# Patient Record
Sex: Female | Born: 1935 | Race: White | Hispanic: No | Marital: Married | Smoking: Never smoker
Health system: Southern US, Community
[De-identification: ages and names within clinical notes are randomized; demographics above are authoritative.]

## PROBLEM LIST (undated history)

## (undated) DIAGNOSIS — I10 Essential (primary) hypertension: Secondary | ICD-10-CM

## (undated) DIAGNOSIS — K219 Gastro-esophageal reflux disease without esophagitis: Secondary | ICD-10-CM

## (undated) DIAGNOSIS — Z8719 Personal history of other diseases of the digestive system: Secondary | ICD-10-CM

## (undated) DIAGNOSIS — Z9889 Other specified postprocedural states: Secondary | ICD-10-CM

## (undated) DIAGNOSIS — I4891 Unspecified atrial fibrillation: Secondary | ICD-10-CM

## (undated) DIAGNOSIS — I079 Rheumatic tricuspid valve disease, unspecified: Secondary | ICD-10-CM

## (undated) DIAGNOSIS — F419 Anxiety disorder, unspecified: Secondary | ICD-10-CM

## (undated) DIAGNOSIS — K2270 Barrett's esophagus without dysplasia: Secondary | ICD-10-CM

## (undated) DIAGNOSIS — C50919 Malignant neoplasm of unspecified site of unspecified female breast: Secondary | ICD-10-CM

## (undated) DIAGNOSIS — K5730 Diverticulosis of large intestine without perforation or abscess without bleeding: Secondary | ICD-10-CM

## (undated) DIAGNOSIS — E785 Hyperlipidemia, unspecified: Secondary | ICD-10-CM

## (undated) DIAGNOSIS — M1990 Unspecified osteoarthritis, unspecified site: Secondary | ICD-10-CM

## (undated) DIAGNOSIS — K635 Polyp of colon: Secondary | ICD-10-CM

## (undated) HISTORY — DX: Unspecified osteoarthritis, unspecified site: M19.90

## (undated) HISTORY — PX: MASTECTOMY: SHX3

## (undated) HISTORY — DX: Personal history of other diseases of the digestive system: Z87.19

## (undated) HISTORY — DX: Essential (primary) hypertension: I10

## (undated) HISTORY — DX: Malignant neoplasm of unspecified site of unspecified female breast: C50.919

## (undated) HISTORY — PX: CATARACT EXTRACTION: SUR2

## (undated) HISTORY — DX: Unspecified atrial fibrillation: I48.91

## (undated) HISTORY — DX: Diverticulosis of large intestine without perforation or abscess without bleeding: K57.30

## (undated) HISTORY — DX: Rheumatic tricuspid valve disease, unspecified: I07.9

## (undated) HISTORY — DX: Gastro-esophageal reflux disease without esophagitis: K21.9

## (undated) HISTORY — PX: OTHER SURGICAL HISTORY: SHX169

## (undated) HISTORY — PX: ABDOMINAL HYSTERECTOMY: SHX81

## (undated) HISTORY — DX: Polyp of colon: K63.5

## (undated) HISTORY — DX: Personal history of other diseases of the digestive system: Z98.890

## (undated) HISTORY — DX: Hyperlipidemia, unspecified: E78.5

## (undated) HISTORY — DX: Barrett's esophagus without dysplasia: K22.70

## (undated) HISTORY — DX: Anxiety disorder, unspecified: F41.9

---

## 2002-03-09 ENCOUNTER — Ambulatory Visit (HOSPITAL_COMMUNITY): Admission: RE | Admit: 2002-03-09 | Discharge: 2002-03-09 | Payer: Self-pay | Admitting: Gastroenterology

## 2002-03-09 ENCOUNTER — Encounter: Payer: Self-pay | Admitting: Gastroenterology

## 2003-11-26 ENCOUNTER — Ambulatory Visit: Payer: Self-pay | Admitting: Internal Medicine

## 2004-05-05 ENCOUNTER — Ambulatory Visit: Payer: Self-pay | Admitting: Internal Medicine

## 2004-11-07 ENCOUNTER — Ambulatory Visit: Payer: Self-pay | Admitting: Gastroenterology

## 2004-11-07 ENCOUNTER — Ambulatory Visit: Payer: Self-pay | Admitting: Internal Medicine

## 2004-11-08 ENCOUNTER — Encounter (INDEPENDENT_AMBULATORY_CARE_PROVIDER_SITE_OTHER): Payer: Self-pay | Admitting: Specialist

## 2004-11-08 ENCOUNTER — Ambulatory Visit: Payer: Self-pay | Admitting: Gastroenterology

## 2004-11-20 ENCOUNTER — Ambulatory Visit: Payer: Self-pay | Admitting: Gastroenterology

## 2005-04-17 ENCOUNTER — Ambulatory Visit: Payer: Self-pay | Admitting: Internal Medicine

## 2005-09-12 ENCOUNTER — Ambulatory Visit: Payer: Self-pay | Admitting: Internal Medicine

## 2005-11-01 ENCOUNTER — Ambulatory Visit: Payer: Self-pay | Admitting: Internal Medicine

## 2005-11-01 LAB — CONVERTED CEMR LAB
ALT: 21 units/L (ref 0–40)
AST: 29 units/L (ref 0–37)
Albumin: 3.8 g/dL (ref 3.5–5.2)
Alkaline Phosphatase: 86 units/L (ref 39–117)
BUN: 15 mg/dL (ref 6–23)
Bilirubin, Direct: 0.1 mg/dL (ref 0.0–0.3)
CO2: 29 meq/L (ref 19–32)
Calcium: 9.5 mg/dL (ref 8.4–10.5)
Chloride: 102 meq/L (ref 96–112)
Chol/HDL Ratio, serum: 4.6
Cholesterol: 236 mg/dL (ref 0–200)
Creatinine, Ser: 1.1 mg/dL (ref 0.4–1.2)
GFR calc non Af Amer: 51 mL/min
Glomerular Filtration Rate, Af Am: 62 mL/min/{1.73_m2}
Glucose, Bld: 95 mg/dL (ref 70–99)
HDL: 50.9 mg/dL (ref >39.0)
LDL DIRECT: 135.4 mg/dL
Potassium: 4 meq/L (ref 3.5–5.1)
Sodium: 140 meq/L (ref 135–145)
TSH: 3.11 microintl units/mL (ref 0.35–5.50)
Total Bilirubin: 1.2 mg/dL (ref 0.3–1.2)
Total Protein: 7 g/dL (ref 6.0–8.3)
Triglyceride fasting, serum: 283 mg/dL (ref 0–149)
VLDL: 57 mg/dL — ABNORMAL HIGH (ref 0–40)

## 2006-04-09 ENCOUNTER — Ambulatory Visit: Payer: Self-pay | Admitting: Internal Medicine

## 2006-04-25 ENCOUNTER — Ambulatory Visit: Payer: Self-pay | Admitting: Internal Medicine

## 2006-04-25 LAB — CONVERTED CEMR LAB
ALT: 24 units/L (ref 0–40)
AST: 27 units/L (ref 0–37)
Albumin: 3.9 g/dL (ref 3.5–5.2)
Alkaline Phosphatase: 82 units/L (ref 39–117)
BUN: 26 mg/dL — ABNORMAL HIGH (ref 6–23)
Bilirubin, Direct: 0.2 mg/dL (ref 0.0–0.3)
CO2: 29 meq/L (ref 19–32)
Calcium: 9.3 mg/dL (ref 8.4–10.5)
Chloride: 98 meq/L (ref 96–112)
Cholesterol: 227 mg/dL (ref 0–200)
Creatinine, Ser: 1.2 mg/dL (ref 0.4–1.2)
Direct LDL: 139.4 mg/dL
GFR calc Af Amer: 56 mL/min
GFR calc non Af Amer: 46 mL/min
Glucose, Bld: 85 mg/dL (ref 70–99)
HDL: 53.6 mg/dL (ref >39.0)
Potassium: 3.8 meq/L (ref 3.5–5.1)
Sodium: 138 meq/L (ref 135–145)
Total Bilirubin: 1.2 mg/dL (ref 0.3–1.2)
Total CHOL/HDL Ratio: 4.2
Total Protein: 7 g/dL (ref 6.0–8.3)
Triglycerides: 245 mg/dL (ref 0–149)
VLDL: 49 mg/dL — ABNORMAL HIGH (ref 0–40)

## 2006-08-22 ENCOUNTER — Telehealth: Payer: Self-pay | Admitting: Internal Medicine

## 2006-09-20 ENCOUNTER — Telehealth: Payer: Self-pay | Admitting: Internal Medicine

## 2006-09-23 ENCOUNTER — Ambulatory Visit: Payer: Self-pay | Admitting: Internal Medicine

## 2006-09-23 DIAGNOSIS — K219 Gastro-esophageal reflux disease without esophagitis: Secondary | ICD-10-CM | POA: Insufficient documentation

## 2006-09-23 DIAGNOSIS — I10 Essential (primary) hypertension: Secondary | ICD-10-CM | POA: Insufficient documentation

## 2006-09-23 DIAGNOSIS — F411 Generalized anxiety disorder: Secondary | ICD-10-CM | POA: Insufficient documentation

## 2006-09-23 DIAGNOSIS — E785 Hyperlipidemia, unspecified: Secondary | ICD-10-CM | POA: Insufficient documentation

## 2006-10-15 ENCOUNTER — Ambulatory Visit: Payer: Self-pay | Admitting: Internal Medicine

## 2006-10-15 DIAGNOSIS — R7309 Other abnormal glucose: Secondary | ICD-10-CM | POA: Insufficient documentation

## 2006-10-15 DIAGNOSIS — I4891 Unspecified atrial fibrillation: Secondary | ICD-10-CM | POA: Insufficient documentation

## 2006-10-15 LAB — CONVERTED CEMR LAB
ALT: 24 units/L (ref 0–35)
AST: 29 units/L (ref 0–37)
Albumin: 3.9 g/dL (ref 3.5–5.2)
Alkaline Phosphatase: 83 units/L (ref 39–117)
BUN: 16 mg/dL (ref 6–23)
Basophils Absolute: 0 10*3/uL (ref 0.0–0.1)
Basophils Relative: 0.5 % (ref 0.0–1.0)
Bilirubin, Direct: 0.3 mg/dL (ref 0.0–0.3)
CO2: 29 meq/L (ref 19–32)
Calcium: 9.6 mg/dL (ref 8.4–10.5)
Chloride: 103 meq/L (ref 96–112)
Cholesterol, target level: 200 mg/dL
Cholesterol: 215 mg/dL (ref 0–200)
Creatinine, Ser: 1.1 mg/dL (ref 0.4–1.2)
Direct LDL: 124.5 mg/dL
Eosinophils Absolute: 0.1 10*3/uL (ref 0.0–0.6)
Eosinophils Relative: 2.1 % (ref 0.0–5.0)
GFR calc Af Amer: 62 mL/min
GFR calc non Af Amer: 51 mL/min
Glucose, Bld: 112 mg/dL — ABNORMAL HIGH (ref 70–99)
HCT: 41.3 % (ref 36.0–46.0)
HDL goal, serum: 40 mg/dL
HDL: 45.5 mg/dL (ref >39.0)
Hemoglobin: 14.1 g/dL (ref 12.0–15.0)
Hgb A1c MFr Bld: 6.1 % — ABNORMAL HIGH (ref 4.6–6.0)
LDL Goal: 130 mg/dL
Lymphocytes Relative: 25.5 % (ref 12.0–46.0)
MCHC: 34.1 g/dL (ref 30.0–36.0)
MCV: 89.7 fL (ref 78.0–100.0)
Monocytes Absolute: 0.9 10*3/uL — ABNORMAL HIGH (ref 0.2–0.7)
Monocytes Relative: 13.3 % — ABNORMAL HIGH (ref 3.0–11.0)
Neutro Abs: 4.3 10*3/uL (ref 1.4–7.7)
Neutrophils Relative %: 58.6 % (ref 43.0–77.0)
Platelets: 272 10*3/uL (ref 150–400)
Potassium: 4.4 meq/L (ref 3.5–5.1)
RBC: 4.61 M/uL (ref 3.87–5.11)
RDW: 13.8 % (ref 11.5–14.6)
Sodium: 140 meq/L (ref 135–145)
TSH: 3.21 microintl units/mL (ref 0.35–5.50)
Total Bilirubin: 1.3 mg/dL — ABNORMAL HIGH (ref 0.3–1.2)
Total CHOL/HDL Ratio: 4.7
Total Protein: 7 g/dL (ref 6.0–8.3)
Triglycerides: 343 mg/dL (ref 0–149)
VLDL: 69 mg/dL — ABNORMAL HIGH (ref 0–40)
WBC: 7.1 10*3/uL (ref 4.5–10.5)

## 2006-10-18 ENCOUNTER — Ambulatory Visit: Payer: Self-pay | Admitting: Internal Medicine

## 2006-10-18 LAB — CONVERTED CEMR LAB
INR: 2.1
Prothrombin Time: 17.8 s

## 2006-10-21 ENCOUNTER — Ambulatory Visit: Payer: Self-pay

## 2006-10-21 ENCOUNTER — Encounter: Payer: Self-pay | Admitting: Internal Medicine

## 2006-10-23 ENCOUNTER — Ambulatory Visit: Payer: Self-pay | Admitting: Internal Medicine

## 2006-10-23 DIAGNOSIS — I079 Rheumatic tricuspid valve disease, unspecified: Secondary | ICD-10-CM

## 2006-10-23 HISTORY — DX: Rheumatic tricuspid valve disease, unspecified: I07.9

## 2006-10-23 LAB — CONVERTED CEMR LAB
INR: 4.8
Prothrombin Time: 26.4 s

## 2006-10-30 ENCOUNTER — Ambulatory Visit: Payer: Self-pay | Admitting: Internal Medicine

## 2006-10-30 LAB — CONVERTED CEMR LAB
INR: 6.1
Prothrombin Time: 29.9 s

## 2006-10-31 ENCOUNTER — Encounter: Payer: Self-pay | Admitting: Internal Medicine

## 2006-11-06 ENCOUNTER — Ambulatory Visit: Payer: Self-pay | Admitting: Internal Medicine

## 2006-11-06 LAB — CONVERTED CEMR LAB
INR: 4.3
Prothrombin Time: 25 s

## 2006-11-13 ENCOUNTER — Ambulatory Visit: Payer: Self-pay | Admitting: Internal Medicine

## 2006-11-13 LAB — CONVERTED CEMR LAB
INR: 2.9
Prothrombin Time: 20.6 s

## 2006-11-19 ENCOUNTER — Telehealth: Payer: Self-pay | Admitting: Internal Medicine

## 2006-11-29 ENCOUNTER — Ambulatory Visit: Payer: Self-pay | Admitting: Internal Medicine

## 2006-11-29 LAB — CONVERTED CEMR LAB
INR: 1.9
Prothrombin Time: 17 s

## 2006-12-16 ENCOUNTER — Ambulatory Visit: Payer: Self-pay | Admitting: Cardiovascular Disease

## 2006-12-16 ENCOUNTER — Telehealth (INDEPENDENT_AMBULATORY_CARE_PROVIDER_SITE_OTHER): Payer: Self-pay | Admitting: *Deleted

## 2006-12-30 ENCOUNTER — Ambulatory Visit: Payer: Self-pay | Admitting: Internal Medicine

## 2006-12-30 LAB — CONVERTED CEMR LAB
INR: 4
Prothrombin Time: 24.4 s

## 2007-01-09 DIAGNOSIS — K5730 Diverticulosis of large intestine without perforation or abscess without bleeding: Secondary | ICD-10-CM

## 2007-01-09 DIAGNOSIS — K635 Polyp of colon: Secondary | ICD-10-CM

## 2007-01-09 HISTORY — DX: Polyp of colon: K63.5

## 2007-01-09 HISTORY — DX: Diverticulosis of large intestine without perforation or abscess without bleeding: K57.30

## 2007-01-30 ENCOUNTER — Ambulatory Visit: Payer: Self-pay | Admitting: Internal Medicine

## 2007-01-30 LAB — CONVERTED CEMR LAB
INR: 2.3
Prothrombin Time: 18.5 s

## 2007-02-14 ENCOUNTER — Encounter: Payer: Self-pay | Admitting: Internal Medicine

## 2007-02-18 ENCOUNTER — Ambulatory Visit: Payer: Self-pay | Admitting: Gastroenterology

## 2007-02-20 ENCOUNTER — Telehealth: Payer: Self-pay | Admitting: Internal Medicine

## 2007-03-03 ENCOUNTER — Ambulatory Visit: Payer: Self-pay | Admitting: Internal Medicine

## 2007-03-03 LAB — CONVERTED CEMR LAB: INR: 4

## 2007-03-05 LAB — CONVERTED CEMR LAB
ALT: 30 units/L (ref 0–35)
AST: 35 units/L (ref 0–37)
Albumin: 3.5 g/dL (ref 3.5–5.2)
Alkaline Phosphatase: 75 units/L (ref 39–117)
BUN: 15 mg/dL (ref 6–23)
Bilirubin, Direct: 0.3 mg/dL (ref 0.0–0.3)
CO2: 33 meq/L — ABNORMAL HIGH (ref 19–32)
Calcium: 9.3 mg/dL (ref 8.4–10.5)
Chloride: 95 meq/L — ABNORMAL LOW (ref 96–112)
Cholesterol: 179 mg/dL (ref 0–200)
Creatinine, Ser: 0.9 mg/dL (ref 0.4–1.2)
Direct LDL: 55.8 mg/dL
GFR calc Af Amer: 78 mL/min
GFR calc non Af Amer: 64 mL/min
Glucose, Bld: 104 mg/dL — ABNORMAL HIGH (ref 70–99)
HDL: 33 mg/dL — ABNORMAL LOW (ref >39.0)
Potassium: 4.1 meq/L (ref 3.5–5.1)
Sodium: 139 meq/L (ref 135–145)
Total Bilirubin: 1.3 mg/dL — ABNORMAL HIGH (ref 0.3–1.2)
Total CHOL/HDL Ratio: 5.4
Total Protein: 6.4 g/dL (ref 6.0–8.3)
Triglycerides: 685 mg/dL (ref 0–149)
VLDL: 137 mg/dL — ABNORMAL HIGH (ref 0–40)

## 2007-03-11 ENCOUNTER — Encounter: Payer: Self-pay | Admitting: Gastroenterology

## 2007-03-11 ENCOUNTER — Ambulatory Visit: Payer: Self-pay | Admitting: Gastroenterology

## 2007-03-11 ENCOUNTER — Encounter: Payer: Self-pay | Admitting: Internal Medicine

## 2007-04-01 ENCOUNTER — Ambulatory Visit: Payer: Self-pay | Admitting: Internal Medicine

## 2007-04-01 LAB — CONVERTED CEMR LAB
INR: 1.4
Prothrombin Time: 14.7 s

## 2007-04-07 LAB — CONVERTED CEMR LAB
ALT: 30 units/L (ref 0–35)
AST: 35 units/L (ref 0–37)
Albumin: 4.1 g/dL (ref 3.5–5.2)
Alkaline Phosphatase: 67 units/L (ref 39–117)
BUN: 15 mg/dL (ref 6–23)
Bilirubin, Direct: 0.4 mg/dL — ABNORMAL HIGH (ref 0.0–0.3)
CO2: 32 meq/L (ref 19–32)
Calcium: 9.9 mg/dL (ref 8.4–10.5)
Chloride: 95 meq/L — ABNORMAL LOW (ref 96–112)
Cholesterol: 193 mg/dL (ref 0–200)
Creatinine, Ser: 1.1 mg/dL (ref 0.4–1.2)
Direct LDL: 96 mg/dL
GFR calc Af Amer: 62 mL/min
GFR calc non Af Amer: 51 mL/min
Glucose, Bld: 123 mg/dL — ABNORMAL HIGH (ref 70–99)
HDL: 33.6 mg/dL — ABNORMAL LOW (ref >39.0)
Hemoglobin: 15.3 g/dL — ABNORMAL HIGH (ref 12.0–15.0)
Hgb A1c MFr Bld: 6.3 % — ABNORMAL HIGH (ref 4.6–6.0)
Potassium: 4.7 meq/L (ref 3.5–5.1)
Sodium: 136 meq/L (ref 135–145)
Total Bilirubin: 1.4 mg/dL — ABNORMAL HIGH (ref 0.3–1.2)
Total CHOL/HDL Ratio: 5.7
Total Protein: 6.9 g/dL (ref 6.0–8.3)
Triglycerides: 385 mg/dL (ref 0–149)
VLDL: 77 mg/dL — ABNORMAL HIGH (ref 0–40)

## 2007-04-12 ENCOUNTER — Encounter: Payer: Self-pay | Admitting: Internal Medicine

## 2007-04-15 ENCOUNTER — Ambulatory Visit: Payer: Self-pay | Admitting: Internal Medicine

## 2007-04-15 LAB — CONVERTED CEMR LAB
INR: 2.3
Prothrombin Time: 18.6 s

## 2007-05-15 ENCOUNTER — Ambulatory Visit: Payer: Self-pay | Admitting: Internal Medicine

## 2007-05-15 LAB — CONVERTED CEMR LAB
INR: 2.5
Prothrombin Time: 19.2 s

## 2007-06-18 ENCOUNTER — Ambulatory Visit: Payer: Self-pay | Admitting: Internal Medicine

## 2007-06-18 LAB — CONVERTED CEMR LAB
INR: 2.4
Prothrombin Time: 18.8 s

## 2007-07-23 ENCOUNTER — Ambulatory Visit: Payer: Self-pay | Admitting: Internal Medicine

## 2007-07-23 LAB — CONVERTED CEMR LAB
INR: 3.1
Prothrombin Time: 21.2 s

## 2007-08-19 ENCOUNTER — Ambulatory Visit: Payer: Self-pay | Admitting: Internal Medicine

## 2007-08-19 LAB — CONVERTED CEMR LAB
INR: 2.8
Prothrombin Time: 20.2 s

## 2007-09-22 ENCOUNTER — Ambulatory Visit: Payer: Self-pay | Admitting: Internal Medicine

## 2007-09-22 LAB — CONVERTED CEMR LAB
ALT: 35 units/L (ref 0–35)
AST: 35 units/L (ref 0–37)
Albumin: 3.9 g/dL (ref 3.5–5.2)
Alkaline Phosphatase: 72 units/L (ref 39–117)
BUN: 20 mg/dL (ref 6–23)
Bilirubin, Direct: 0.1 mg/dL (ref 0.0–0.3)
CO2: 31 meq/L (ref 19–32)
Calcium: 9.2 mg/dL (ref 8.4–10.5)
Chloride: 107 meq/L (ref 96–112)
Cholesterol: 169 mg/dL (ref 0–200)
Creatinine, Ser: 1 mg/dL (ref 0.4–1.2)
Direct LDL: 73.4 mg/dL
GFR calc Af Amer: 69 mL/min
GFR calc non Af Amer: 57 mL/min
Glucose, Bld: 113 mg/dL — ABNORMAL HIGH (ref 70–99)
HDL: 33.5 mg/dL — ABNORMAL LOW (ref >39.0)
Hgb A1c MFr Bld: 6.1 % — ABNORMAL HIGH (ref 4.6–6.0)
Potassium: 3.6 meq/L (ref 3.5–5.1)
Sodium: 143 meq/L (ref 135–145)
Total Bilirubin: 1.2 mg/dL (ref 0.3–1.2)
Total CHOL/HDL Ratio: 5
Total Protein: 7.2 g/dL (ref 6.0–8.3)
Triglycerides: 544 mg/dL (ref 0–149)
VLDL: 109 mg/dL — ABNORMAL HIGH (ref 0–40)

## 2007-10-01 ENCOUNTER — Ambulatory Visit: Payer: Self-pay | Admitting: Internal Medicine

## 2007-10-22 ENCOUNTER — Ambulatory Visit: Payer: Self-pay | Admitting: Internal Medicine

## 2007-10-22 LAB — CONVERTED CEMR LAB
INR: 1.7
Prothrombin Time: 15.9 s

## 2007-10-24 ENCOUNTER — Ambulatory Visit: Payer: Self-pay | Admitting: Internal Medicine

## 2007-11-11 ENCOUNTER — Ambulatory Visit: Payer: Self-pay | Admitting: Internal Medicine

## 2007-11-11 LAB — CONVERTED CEMR LAB
INR: 3.7
Prothrombin Time: 23.2 s

## 2007-11-25 ENCOUNTER — Ambulatory Visit: Payer: Self-pay | Admitting: Internal Medicine

## 2007-11-25 LAB — CONVERTED CEMR LAB
INR: 1.7
Prothrombin Time: 16.1 s

## 2007-12-24 ENCOUNTER — Ambulatory Visit: Payer: Self-pay | Admitting: Internal Medicine

## 2007-12-24 DIAGNOSIS — M109 Gout, unspecified: Secondary | ICD-10-CM | POA: Insufficient documentation

## 2007-12-24 LAB — CONVERTED CEMR LAB
INR: 1.9
Prothrombin Time: 16.9 s

## 2007-12-25 ENCOUNTER — Telehealth: Payer: Self-pay | Admitting: Internal Medicine

## 2008-01-08 ENCOUNTER — Telehealth: Payer: Self-pay | Admitting: Internal Medicine

## 2008-01-26 ENCOUNTER — Ambulatory Visit: Payer: Self-pay | Admitting: Internal Medicine

## 2008-01-26 LAB — CONVERTED CEMR LAB
INR: 1.4
Prothrombin Time: 14.7 s

## 2008-02-10 ENCOUNTER — Encounter: Payer: Self-pay | Admitting: Internal Medicine

## 2008-02-10 ENCOUNTER — Ambulatory Visit: Payer: Self-pay | Admitting: Internal Medicine

## 2008-02-10 LAB — CONVERTED CEMR LAB
INR: 4.6
Prothrombin Time: 26.1 s

## 2008-03-02 ENCOUNTER — Telehealth: Payer: Self-pay | Admitting: Family Medicine

## 2008-03-02 ENCOUNTER — Ambulatory Visit: Payer: Self-pay | Admitting: Family Medicine

## 2008-03-02 LAB — CONVERTED CEMR LAB
Bilirubin Urine: NEGATIVE
Blood in Urine, dipstick: NEGATIVE
Glucose, Urine, Semiquant: NEGATIVE
INR: 1.6
Ketones, urine, test strip: NEGATIVE
Nitrite: POSITIVE
Protein, U semiquant: NEGATIVE
Prothrombin Time: 15.6 s
Specific Gravity, Urine: <1.005
Urobilinogen, UA: 0.2
pH: 5.5

## 2008-03-03 LAB — CONVERTED CEMR LAB
Basophils Absolute: 0 10*3/uL (ref 0.0–0.1)
Basophils Relative: 0.2 % (ref 0.0–3.0)
Eosinophils Absolute: 0.1 10*3/uL (ref 0.0–0.7)
Eosinophils Relative: 2.1 % (ref 0.0–5.0)
HCT: 39.8 % (ref 36.0–46.0)
Hemoglobin: 14.2 g/dL (ref 12.0–15.0)
Lymphocytes Relative: 19.5 % (ref 12.0–46.0)
MCHC: 35.8 g/dL (ref 30.0–36.0)
MCV: 90.7 fL (ref 78.0–100.0)
Monocytes Absolute: 1 10*3/uL (ref 0.1–1.0)
Monocytes Relative: 14.3 % — ABNORMAL HIGH (ref 3.0–12.0)
Neutro Abs: 4.4 10*3/uL (ref 1.4–7.7)
Neutrophils Relative %: 63.9 % (ref 43.0–77.0)
Platelets: 185 10*3/uL (ref 150–400)
RBC: 4.39 M/uL (ref 3.87–5.11)
RDW: 13.6 % (ref 11.5–14.6)
WBC: 6.8 10*3/uL (ref 4.5–10.5)

## 2008-03-11 ENCOUNTER — Telehealth: Payer: Self-pay | Admitting: Internal Medicine

## 2008-03-15 ENCOUNTER — Telehealth: Payer: Self-pay | Admitting: Internal Medicine

## 2008-03-17 ENCOUNTER — Telehealth: Payer: Self-pay | Admitting: Internal Medicine

## 2008-03-24 ENCOUNTER — Ambulatory Visit: Payer: Self-pay | Admitting: Internal Medicine

## 2008-03-25 ENCOUNTER — Encounter: Payer: Self-pay | Admitting: Internal Medicine

## 2008-03-25 LAB — CONVERTED CEMR LAB
ALT: 32 units/L (ref 0–35)
AST: 30 units/L (ref 0–37)
Albumin: 4 g/dL (ref 3.5–5.2)
Alkaline Phosphatase: 71 units/L (ref 39–117)
BUN: 21 mg/dL (ref 6–23)
Bilirubin, Direct: 0.2 mg/dL (ref 0.0–0.3)
CO2: 31 meq/L (ref 19–32)
Calcium: 9.1 mg/dL (ref 8.4–10.5)
Chloride: 102 meq/L (ref 96–112)
Cholesterol: 223 mg/dL — ABNORMAL HIGH (ref 0–200)
Creatinine, Ser: 0.9 mg/dL (ref 0.4–1.2)
Direct LDL: 108.6 mg/dL
GFR calc non Af Amer: 64.08 mL/min (ref >60)
Glucose, Bld: 110 mg/dL — ABNORMAL HIGH (ref 70–99)
HDL: 43.1 mg/dL (ref >39.00)
Hgb A1c MFr Bld: 6.2 % (ref 4.6–6.5)
Potassium: 3.4 meq/L — ABNORMAL LOW (ref 3.5–5.1)
Sodium: 144 meq/L (ref 135–145)
TSH: 2.46 microintl units/mL (ref 0.35–5.50)
Total Bilirubin: 1.3 mg/dL — ABNORMAL HIGH (ref 0.3–1.2)
Total CHOL/HDL Ratio: 5
Total Protein: 7.4 g/dL (ref 6.0–8.3)
Triglycerides: 489 mg/dL — ABNORMAL HIGH (ref 0.0–149.0)
VLDL: 97.8 mg/dL — ABNORMAL HIGH (ref 0.0–40.0)

## 2008-04-23 ENCOUNTER — Ambulatory Visit: Payer: Self-pay | Admitting: Internal Medicine

## 2008-04-23 LAB — CONVERTED CEMR LAB
INR: 1.7
Prothrombin Time: 15.9 s

## 2008-05-24 ENCOUNTER — Ambulatory Visit: Payer: Self-pay | Admitting: Internal Medicine

## 2008-05-24 LAB — CONVERTED CEMR LAB
INR: 3
Prothrombin Time: 20.9 s

## 2008-06-21 ENCOUNTER — Ambulatory Visit: Payer: Self-pay | Admitting: Internal Medicine

## 2008-06-21 LAB — CONVERTED CEMR LAB
INR: 2.7
Prothrombin Time: 20 s

## 2008-07-19 ENCOUNTER — Ambulatory Visit: Payer: Self-pay | Admitting: Internal Medicine

## 2008-07-19 LAB — CONVERTED CEMR LAB
INR: 3.3
Prothrombin Time: 22 s

## 2008-08-17 ENCOUNTER — Ambulatory Visit: Payer: Self-pay | Admitting: Internal Medicine

## 2008-08-17 LAB — CONVERTED CEMR LAB
INR: 1.9
Prothrombin Time: 16.8 s

## 2008-09-24 ENCOUNTER — Ambulatory Visit: Payer: Self-pay | Admitting: Internal Medicine

## 2008-09-29 LAB — CONVERTED CEMR LAB
ALT: 34 units/L (ref 0–35)
AST: 35 units/L (ref 0–37)
Albumin: 3.9 g/dL (ref 3.5–5.2)
Alkaline Phosphatase: 77 units/L (ref 39–117)
BUN: 22 mg/dL (ref 6–23)
Bilirubin, Direct: 0.1 mg/dL (ref 0.0–0.3)
CO2: 33 meq/L — ABNORMAL HIGH (ref 19–32)
Calcium: 9.4 mg/dL (ref 8.4–10.5)
Chloride: 100 meq/L (ref 96–112)
Cholesterol: 265 mg/dL — ABNORMAL HIGH (ref 0–200)
Creatinine, Ser: 1.1 mg/dL (ref 0.4–1.2)
Direct LDL: 73 mg/dL
GFR calc non Af Amer: 50.77 mL/min (ref >60)
Glucose, Bld: 113 mg/dL — ABNORMAL HIGH (ref 70–99)
HDL: 41.1 mg/dL (ref >39.00)
Potassium: 3.9 meq/L (ref 3.5–5.1)
Sodium: 140 meq/L (ref 135–145)
TSH: 1.62 microintl units/mL (ref 0.35–5.50)
Total Bilirubin: 1.5 mg/dL — ABNORMAL HIGH (ref 0.3–1.2)
Total CHOL/HDL Ratio: 6
Total Protein: 7.2 g/dL (ref 6.0–8.3)
Triglycerides: 881 mg/dL — ABNORMAL HIGH (ref 0.0–149.0)
VLDL: 176.2 mg/dL — ABNORMAL HIGH (ref 0.0–40.0)

## 2008-10-14 ENCOUNTER — Ambulatory Visit: Payer: Self-pay | Admitting: Internal Medicine

## 2008-11-03 ENCOUNTER — Ambulatory Visit: Payer: Self-pay | Admitting: Internal Medicine

## 2008-11-03 LAB — CONVERTED CEMR LAB
ALT: 28 units/L (ref 0–35)
AST: 29 units/L (ref 0–37)
Albumin: 3.7 g/dL (ref 3.5–5.2)
Alkaline Phosphatase: 60 units/L (ref 39–117)
Bilirubin, Direct: 0 mg/dL (ref 0.0–0.3)
Cholesterol: 236 mg/dL — ABNORMAL HIGH (ref 0–200)
Direct LDL: 79.9 mg/dL
HDL: 36.2 mg/dL — ABNORMAL LOW (ref >39.00)
INR: 1.7
Prothrombin Time: 16.2 s
Total Bilirubin: 1 mg/dL (ref 0.3–1.2)
Total CHOL/HDL Ratio: 7
Total Protein: 6.9 g/dL (ref 6.0–8.3)
Triglycerides: 810 mg/dL — ABNORMAL HIGH (ref 0.0–149.0)
VLDL: 162 mg/dL — ABNORMAL HIGH (ref 0.0–40.0)

## 2008-11-10 ENCOUNTER — Telehealth: Payer: Self-pay | Admitting: Internal Medicine

## 2008-12-15 ENCOUNTER — Ambulatory Visit: Payer: Self-pay | Admitting: Internal Medicine

## 2008-12-15 LAB — CONVERTED CEMR LAB
INR: 1.6
Prothrombin Time: 15.5 s

## 2008-12-29 ENCOUNTER — Ambulatory Visit: Payer: Self-pay | Admitting: Internal Medicine

## 2008-12-29 LAB — CONVERTED CEMR LAB
INR: 2.8
Prothrombin Time: 20.2 s

## 2009-01-26 ENCOUNTER — Ambulatory Visit: Payer: Self-pay | Admitting: Internal Medicine

## 2009-01-26 LAB — CONVERTED CEMR LAB
INR: 3.5
Prothrombin Time: 22.5 s

## 2009-01-31 ENCOUNTER — Telehealth: Payer: Self-pay | Admitting: Internal Medicine

## 2009-02-16 ENCOUNTER — Ambulatory Visit: Payer: Self-pay | Admitting: Internal Medicine

## 2009-02-16 LAB — CONVERTED CEMR LAB
ALT: 30 units/L (ref 0–35)
AST: 32 units/L (ref 0–37)
Albumin: 3.8 g/dL (ref 3.5–5.2)
Alkaline Phosphatase: 64 units/L (ref 39–117)
Bilirubin, Direct: 0.1 mg/dL (ref 0.0–0.3)
Cholesterol: 344 mg/dL — ABNORMAL HIGH (ref 0–200)
Direct LDL: 56.8 mg/dL
HDL: 51.03 mg/dL (ref >39.00)
Total Bilirubin: 1 mg/dL (ref 0.3–1.2)
Total CHOL/HDL Ratio: 7
Total Protein: 7 g/dL (ref 6.0–8.3)
Triglycerides: 1504 mg/dL — ABNORMAL HIGH (ref 0.0–149.0)
VLDL: 300.8 mg/dL — ABNORMAL HIGH (ref 0.0–40.0)

## 2009-02-28 ENCOUNTER — Ambulatory Visit: Payer: Self-pay | Admitting: Internal Medicine

## 2009-02-28 LAB — CONVERTED CEMR LAB
INR: 2.2
Prothrombin Time: 18.2 s

## 2009-04-04 ENCOUNTER — Ambulatory Visit: Payer: Self-pay | Admitting: Internal Medicine

## 2009-04-04 LAB — CONVERTED CEMR LAB
INR: 2.7
Prothrombin Time: 19.8 s

## 2009-04-11 LAB — CONVERTED CEMR LAB
ALT: 28 units/L (ref 0–35)
AST: 30 units/L (ref 0–37)
Albumin: 3.8 g/dL (ref 3.5–5.2)
Alkaline Phosphatase: 69 units/L (ref 39–117)
BUN: 18 mg/dL (ref 6–23)
Basophils Absolute: 0 10*3/uL (ref 0.0–0.1)
Basophils Relative: 0.6 % (ref 0.0–3.0)
Bilirubin, Direct: 0.1 mg/dL (ref 0.0–0.3)
CO2: 33 meq/L — ABNORMAL HIGH (ref 19–32)
Calcium: 9.5 mg/dL (ref 8.4–10.5)
Chloride: 99 meq/L (ref 96–112)
Cholesterol: 298 mg/dL — ABNORMAL HIGH (ref 0–200)
Creatinine, Ser: 1 mg/dL (ref 0.4–1.2)
Direct LDL: 100.8 mg/dL
Eosinophils Absolute: 0.1 10*3/uL (ref 0.0–0.7)
Eosinophils Relative: 1.9 % (ref 0.0–5.0)
GFR calc non Af Amer: 56.6 mL/min (ref >60)
Glucose, Bld: 103 mg/dL — ABNORMAL HIGH (ref 70–99)
HCT: 43.6 % (ref 36.0–46.0)
HDL: 52.8 mg/dL (ref >39.00)
Hemoglobin: 14.4 g/dL (ref 12.0–15.0)
Lymphocytes Relative: 28.5 % (ref 12.0–46.0)
Lymphs Abs: 1.7 10*3/uL (ref 0.7–4.0)
MCHC: 33.1 g/dL (ref 30.0–36.0)
MCV: 93.4 fL (ref 78.0–100.0)
Monocytes Absolute: 0.6 10*3/uL (ref 0.1–1.0)
Monocytes Relative: 11 % (ref 3.0–12.0)
Neutro Abs: 3.5 10*3/uL (ref 1.4–7.7)
Neutrophils Relative %: 58 % (ref 43.0–77.0)
Platelets: 216 10*3/uL (ref 150.0–400.0)
Potassium: 4.1 meq/L (ref 3.5–5.1)
RBC: 4.67 M/uL (ref 3.87–5.11)
RDW: 13.3 % (ref 11.5–14.6)
Sodium: 141 meq/L (ref 135–145)
TSH: 2.54 microintl units/mL (ref 0.35–5.50)
Total Bilirubin: 0.8 mg/dL (ref 0.3–1.2)
Total CHOL/HDL Ratio: 6
Total Protein: 7.4 g/dL (ref 6.0–8.3)
Triglycerides: 818 mg/dL — ABNORMAL HIGH (ref 0.0–149.0)
VLDL: 163.6 mg/dL — ABNORMAL HIGH (ref 0.0–40.0)
WBC: 5.9 10*3/uL (ref 4.5–10.5)

## 2009-05-05 ENCOUNTER — Ambulatory Visit: Payer: Self-pay | Admitting: Internal Medicine

## 2009-05-05 LAB — CONVERTED CEMR LAB
INR: 2.9
Prothrombin Time: 20.5 s

## 2009-05-19 ENCOUNTER — Ambulatory Visit: Payer: Self-pay | Admitting: Internal Medicine

## 2009-05-23 LAB — CONVERTED CEMR LAB
Cholesterol: 305 mg/dL — ABNORMAL HIGH (ref 0–200)
HDL: 46.3 mg/dL (ref >39.00)
Total CHOL/HDL Ratio: 7
Triglycerides: 1218 mg/dL — ABNORMAL HIGH (ref 0.0–149.0)
VLDL: 243.6 mg/dL — ABNORMAL HIGH (ref 0.0–40.0)

## 2009-06-02 ENCOUNTER — Ambulatory Visit: Payer: Self-pay | Admitting: Internal Medicine

## 2009-06-02 LAB — CONVERTED CEMR LAB
INR: 3.1
Prothrombin Time: 21.4 s

## 2009-07-06 ENCOUNTER — Ambulatory Visit: Payer: Self-pay | Admitting: Internal Medicine

## 2009-07-06 LAB — CONVERTED CEMR LAB
ALT: 29 units/L (ref 0–35)
AST: 36 units/L (ref 0–37)
Albumin: 3.9 g/dL (ref 3.5–5.2)
Alkaline Phosphatase: 69 units/L (ref 39–117)
Bilirubin, Direct: 0.2 mg/dL (ref 0.0–0.3)
Cholesterol: 241 mg/dL — ABNORMAL HIGH (ref 0–200)
Direct LDL: 80.6 mg/dL
HDL: 43.3 mg/dL (ref >39.00)
INR: 3.7
Total Bilirubin: 0.8 mg/dL (ref 0.3–1.2)
Total CHOL/HDL Ratio: 6
Total Protein: 7 g/dL (ref 6.0–8.3)
Triglycerides: 781 mg/dL — ABNORMAL HIGH (ref 0.0–149.0)
VLDL: 156.2 mg/dL — ABNORMAL HIGH (ref 0.0–40.0)

## 2009-07-13 ENCOUNTER — Ambulatory Visit: Payer: Self-pay | Admitting: Internal Medicine

## 2009-07-21 ENCOUNTER — Telehealth: Payer: Self-pay | Admitting: Internal Medicine

## 2009-08-04 ENCOUNTER — Ambulatory Visit: Payer: Self-pay | Admitting: Internal Medicine

## 2009-08-04 LAB — CONVERTED CEMR LAB: INR: 3.5

## 2009-08-19 ENCOUNTER — Ambulatory Visit: Payer: Self-pay | Admitting: Internal Medicine

## 2009-08-22 LAB — CONVERTED CEMR LAB
BUN: 18 mg/dL (ref 6–23)
CO2: 31 meq/L (ref 19–32)
Calcium: 9.8 mg/dL (ref 8.4–10.5)
Chloride: 98 meq/L (ref 96–112)
Creatinine, Ser: 1 mg/dL (ref 0.4–1.2)
GFR calc non Af Amer: 57.88 mL/min (ref >60)
Glucose, Bld: 111 mg/dL — ABNORMAL HIGH (ref 70–99)
Potassium: 4 meq/L (ref 3.5–5.1)
Sodium: 140 meq/L (ref 135–145)
Uric Acid, Serum: 8.1 mg/dL — ABNORMAL HIGH (ref 2.4–7.0)

## 2009-09-16 ENCOUNTER — Ambulatory Visit: Payer: Self-pay | Admitting: Internal Medicine

## 2009-09-16 LAB — CONVERTED CEMR LAB: INR: 2.8

## 2009-10-05 ENCOUNTER — Ambulatory Visit: Payer: Self-pay | Admitting: Internal Medicine

## 2009-10-14 ENCOUNTER — Ambulatory Visit: Payer: Self-pay | Admitting: Internal Medicine

## 2009-10-14 LAB — CONVERTED CEMR LAB: INR: 2.2

## 2009-10-31 ENCOUNTER — Telehealth: Payer: Self-pay | Admitting: Internal Medicine

## 2009-11-16 ENCOUNTER — Ambulatory Visit: Payer: Self-pay | Admitting: Internal Medicine

## 2009-11-16 LAB — CONVERTED CEMR LAB: INR: 2.1

## 2009-11-30 ENCOUNTER — Telehealth: Payer: Self-pay | Admitting: Internal Medicine

## 2009-12-05 ENCOUNTER — Ambulatory Visit: Payer: Self-pay | Admitting: Internal Medicine

## 2009-12-05 LAB — CONVERTED CEMR LAB: INR: 2.7

## 2009-12-08 ENCOUNTER — Telehealth: Payer: Self-pay | Admitting: Internal Medicine

## 2009-12-08 LAB — CONVERTED CEMR LAB
Basophils Absolute: 0 10*3/uL (ref 0.0–0.1)
Basophils Relative: 0.3 % (ref 0.0–3.0)
Eosinophils Absolute: 0.1 10*3/uL (ref 0.0–0.7)
Eosinophils Relative: 1.7 % (ref 0.0–5.0)
HCT: 42.3 % (ref 36.0–46.0)
Hemoglobin: 14.4 g/dL (ref 12.0–15.0)
Lymphocytes Relative: 21.9 % (ref 12.0–46.0)
Lymphs Abs: 1.7 10*3/uL (ref 0.7–4.0)
MCHC: 34.1 g/dL (ref 30.0–36.0)
MCV: 91 fL (ref 78.0–100.0)
Monocytes Absolute: 0.9 10*3/uL (ref 0.1–1.0)
Monocytes Relative: 11 % (ref 3.0–12.0)
Neutro Abs: 5.1 10*3/uL (ref 1.4–7.7)
Neutrophils Relative %: 65.1 % (ref 43.0–77.0)
Platelets: 232 10*3/uL (ref 150.0–400.0)
RBC: 4.64 M/uL (ref 3.87–5.11)
RDW: 14.6 % (ref 11.5–14.6)
Uric Acid, Serum: 8.2 mg/dL — ABNORMAL HIGH (ref 2.4–7.0)
WBC: 7.8 10*3/uL (ref 4.5–10.5)

## 2009-12-14 ENCOUNTER — Telehealth: Payer: Self-pay | Admitting: Internal Medicine

## 2009-12-16 ENCOUNTER — Telehealth: Payer: Self-pay | Admitting: Internal Medicine

## 2009-12-16 ENCOUNTER — Ambulatory Visit: Payer: Self-pay | Admitting: Internal Medicine

## 2009-12-21 ENCOUNTER — Ambulatory Visit: Payer: Self-pay | Admitting: Internal Medicine

## 2009-12-30 LAB — CONVERTED CEMR LAB: INR: 2

## 2010-01-13 ENCOUNTER — Telehealth: Payer: Self-pay | Admitting: Internal Medicine

## 2010-01-18 ENCOUNTER — Telehealth: Payer: Self-pay | Admitting: Internal Medicine

## 2010-01-23 ENCOUNTER — Telehealth: Payer: Self-pay | Admitting: Internal Medicine

## 2010-01-31 ENCOUNTER — Ambulatory Visit
Admission: RE | Admit: 2010-01-31 | Discharge: 2010-01-31 | Payer: Self-pay | Source: Home / Self Care | Attending: Internal Medicine | Admitting: Internal Medicine

## 2010-01-31 ENCOUNTER — Other Ambulatory Visit: Payer: Self-pay | Admitting: Internal Medicine

## 2010-01-31 LAB — BASIC METABOLIC PANEL
BUN: 14 mg/dL (ref 6–23)
CO2: 31 mEq/L (ref 19–32)
Calcium: 9.4 mg/dL (ref 8.4–10.5)
Chloride: 105 mEq/L (ref 96–112)
Creatinine, Ser: 1 mg/dL (ref 0.4–1.2)
GFR: 56.48 mL/min — ABNORMAL LOW (ref >60.00)
Glucose, Bld: 102 mg/dL — ABNORMAL HIGH (ref 70–99)
Potassium: 4.8 mEq/L (ref 3.5–5.1)
Sodium: 143 mEq/L (ref 135–145)

## 2010-01-31 LAB — CBC WITH DIFFERENTIAL/PLATELET
Basophils Absolute: 0 10*3/uL (ref 0.0–0.1)
Basophils Relative: 0.3 % (ref 0.0–3.0)
Eosinophils Absolute: 0.2 10*3/uL (ref 0.0–0.7)
Eosinophils Relative: 2.6 % (ref 0.0–5.0)
HCT: 39.6 % (ref 36.0–46.0)
Hemoglobin: 13.5 g/dL (ref 12.0–15.0)
Lymphocytes Relative: 22.7 % (ref 12.0–46.0)
Lymphs Abs: 1.5 10*3/uL (ref 0.7–4.0)
MCHC: 34.1 g/dL (ref 30.0–36.0)
MCV: 92.1 fl (ref 78.0–100.0)
Monocytes Absolute: 0.6 10*3/uL (ref 0.1–1.0)
Monocytes Relative: 10 % (ref 3.0–12.0)
Neutro Abs: 4.1 10*3/uL (ref 1.4–7.7)
Neutrophils Relative %: 64.4 % (ref 43.0–77.0)
Platelets: 205 10*3/uL (ref 150.0–400.0)
RBC: 4.31 Mil/uL (ref 3.87–5.11)
RDW: 16 % — ABNORMAL HIGH (ref 11.5–14.6)
WBC: 6.4 10*3/uL (ref 4.5–10.5)

## 2010-01-31 LAB — LDL CHOLESTEROL, DIRECT: Direct LDL: 65.7 mg/dL

## 2010-01-31 LAB — HEPATIC FUNCTION PANEL
ALT: 31 U/L (ref 0–35)
AST: 35 U/L (ref 0–37)
Albumin: 3.6 g/dL (ref 3.5–5.2)
Alkaline Phosphatase: 70 U/L (ref 39–117)
Bilirubin, Direct: 0.1 mg/dL (ref 0.0–0.3)
Total Bilirubin: 0.8 mg/dL (ref 0.3–1.2)
Total Protein: 6.5 g/dL (ref 6.0–8.3)

## 2010-01-31 LAB — LIPID PANEL
Cholesterol: 235 mg/dL — ABNORMAL HIGH (ref 0–200)
HDL: 42.7 mg/dL (ref >39.00)
Total CHOL/HDL Ratio: 6
Triglycerides: 900 mg/dL — ABNORMAL HIGH (ref 0.0–149.0)
VLDL: 180 mg/dL — ABNORMAL HIGH (ref 0.0–40.0)

## 2010-01-31 LAB — URIC ACID: Uric Acid, Serum: 4.5 mg/dL (ref 2.4–7.0)

## 2010-01-31 LAB — HEMOGLOBIN A1C: Hgb A1c MFr Bld: 6.3 % (ref 4.6–6.5)

## 2010-02-03 ENCOUNTER — Telehealth: Payer: Self-pay | Admitting: Internal Medicine

## 2010-02-09 NOTE — Assessment & Plan Note (Signed)
Summary: pt/njr   Nurse Visit   Allergies: 1)  ! Acetyl Salicylic Acid (Aspirin) Laboratory Results   Blood Tests   Date/Time Received: Jun 02, 2009 3:11 PM  Date/Time Reported: Jun 02, 2009 3:11 PM   PT: 21.4 s   (Normal Range: 10.6-13.4)  INR: 3.1   (Normal Range: 0.88-1.12   Therap INR: 2.0-3.5) Comments: [name], CMA  Jun 02, 2009 3:12 PM     Orders Added: 1)  Est. Patient Level I [99211] 2)  Protime [[id number]]  Laboratory Results   Blood Tests     PT: 21.4 s   (Normal Range: 10.6-13.4)  INR: 3.1   (Normal Range: 0.88-1.12   Therap INR: 2.0-3.5) Comments: [name], CMA  Jun 02, 2009 3:12 PM       ANTICOAGULATION RECORD PREVIOUS REGIMEN & LAB RESULTS Anticoagulation Diagnosis:  Atrial fibrillation on  10/18/2006 Previous INR Goal Range:  2.0-3.0 on  10/18/2006 Previous INR:  2.9 on  05/05/2009 Previous Coumadin Dose(mg):  2.5mg  qd on  02/28/2009 Previous Regimen:  2.5mg  qd on  02/28/2009 Previous Coagulation Comments:  Dr Lovell Sheehan approved on  01/26/2009  NEW REGIMEN & LAB RESULTS Current INR: 3.1 Regimen: Same Dose       Repeat testing in: 3 weeks MEDICATIONS ALPRAZOLAM 0.5 MG TABS (ALPRAZOLAM) Take 1 tablet by mouth once a day HYDROCHLOROTHIAZIDE 25 MG TABS (HYDROCHLOROTHIAZIDE) Take 1 tablet by mouth every morning OMEPRAZOLE 20 MG CPDR (OMEPRAZOLE) Take 1 capsule by mouth once a day GNP ALLERGY RELIEF 10 MG  TBDP (LORATADINE) once daily WARFARIN SODIUM 5 MG TABS (WARFARIN SODIUM) once daily as directed [facility] DAILY MULTIPLE   TABS (MULTIPLE VITAMIN) 1 once daily DIGOXIN 0.125 MG  TABS (DIGOXIN) Take 1 tablet by mouth once a day SYSTANE NIGHTTIME  OINT (WHITE PETROLATUM-MINERAL OIL) once daily as directed LISINOPRIL 20 MG TABS (LISINOPRIL) 1 tablet by mouth daily CRESTOR 20 MG TABS (ROSUVASTATIN CALCIUM) Take 1 tablet by mouth at bedtime CELEBREX 200 MG CAPS (CELECOXIB) Take 1 tablet by mouth once a day as needed.  Not to exceed 2 per  week   Anticoagulation Visit Questionnaire      Coumadin dose missed/changed:  No      Abnormal Bleeding Symptoms:  No   Any diet changes including alcohol intake, vegetables or greens since the last visit:  No Any illnesses or hospitalizations since the last visit:  No Any signs of clotting since the last visit (including chest discomfort, dizziness, shortness of breath, arm tingling, slurred speech, swelling or redness in leg):  No

## 2010-02-09 NOTE — Assessment & Plan Note (Signed)
Summary: PT/NJR   Nurse Visit   Vital Signs:  Patient profile:   77 year old female BP sitting:   124 / 82 Cuff size:   regular  Allergies: 1)  ! Acetyl Salicylic Acid (Aspirin) Laboratory Results   Blood Tests     PT: 16.8 s   (Normal Range: 10.6-13.4)  INR: 1.9   (Normal Range: 0.88-1.12   Therap INR: 2.0-3.5) Comments: [name]  August 17, 2008 9:40 AM     Orders Added: 1)  Est. Patient Level I [99211] 2)  Fingerstick [36416] 3)  Protime [[id number]]   ANTICOAGULATION RECORD PREVIOUS REGIMEN & LAB RESULTS Anticoagulation Diagnosis:  Atrial fibrillation on  10/18/2006 Previous INR Goal Range:  2.0-3.0 on  10/18/2006 Previous INR:  3.3 on  07/19/2008 Previous Coumadin Dose(mg):  none Mon. & Thurs others 2.5mg  on  06/21/2008 Previous Regimen:  Same  on  06/21/2008 Previous Coagulation Comments:  Approved by Dr. Lovell Sheehan. on  10/22/2007  NEW REGIMEN & LAB RESULTS Current INR: 1.9 Regimen: Same   (no change)  Repeat testing in: 4 weeks  Anticoagulation Visit Questionnaire Coumadin dose missed/changed:  No Abnormal Bleeding Symptoms:  No  Any diet changes including alcohol intake, vegetables or greens since the last visit:  No Any illnesses or hospitalizations since the last visit:  No Any signs of clotting since the last visit (including chest discomfort, dizziness, shortness of breath, arm tingling, slurred speech, swelling or redness in leg):  No  MEDICATIONS ALPRAZOLAM 0.5 MG TABS (ALPRAZOLAM) Take 1 tablet by mouth once a day HYDROCHLOROTHIAZIDE 25 MG TABS (HYDROCHLOROTHIAZIDE) Take 1 tablet by mouth every morning OMEPRAZOLE 20 MG CPDR (OMEPRAZOLE) Take 1 capsule by mouth once a day VYTORIN 10-40 MG TABS (EZETIMIBE-SIMVASTATIN) Take 1 tablet by mouth once a day GNP ALLERGY RELIEF 10 MG  TBDP (LORATADINE) once daily WARFARIN SODIUM 5 MG TABS (WARFARIN SODIUM) once daily as directed [facility] DAILY MULTIPLE   TABS (MULTIPLE VITAMIN) 1 once daily DIGOXIN  0.125 MG  TABS (DIGOXIN) Take 1 tablet by mouth once a day SYSTANE NIGHTTIME  OINT (WHITE PETROLATUM-MINERAL OIL) once daily as directed LISINOPRIL 20 MG TABS (LISINOPRIL) 1 tablet by mouth daily    Vital Signs:  Patient Profile:   77 year old female BP sitting:   124 / 82  (left arm) Cuff size:   regular

## 2010-02-09 NOTE — Letter (Signed)
Summary: triage note  triage note   Imported By: Kassie Mends 11/05/2006 11:24:52  _____________________________________________________________________  External Attachment:    Type:   Image     Comment:   triage note

## 2010-02-09 NOTE — Assessment & Plan Note (Signed)
Summary: pt/cjr   Nurse Visit   Allergies: 1)  ! Acetyl Salicylic Acid (Aspirin) Laboratory Results   Blood Tests     PT: 15.5 s   (Normal Range: 10.6-13.4)  INR: 1.6   (Normal Range: 0.88-1.12   Therap INR: 2.0-3.5) Comments: Joanne Chars CMA  December 15, 2008 3:29 PM     Orders Added: 1)  Est. Patient Level I [99211] 2)  Protime [[id number]]   ANTICOAGULATION RECORD PREVIOUS REGIMEN & LAB RESULTS Anticoagulation Diagnosis:  Atrial fibrillation on  10/18/2006 Previous INR Goal Range:  2.0-3.0 on  10/18/2006 Previous INR:  1.7 on  11/03/2008 Previous Coumadin Dose(mg):  none Mon. & Thurs others 2.5mg  on  06/21/2008 Previous Regimen:  same on  09/24/2008 Previous Coagulation Comments:  Approved by Dr. Lovell Sheehan. on  10/22/2007  NEW REGIMEN & LAB RESULTS Current INR: 1.6 Regimen: Take 2.5mg  everyday. RTO 2 wks.   Anticoagulation Visit Questionnaire Coumadin dose missed/changed:  No Abnormal Bleeding Symptoms:  No  Any diet changes including alcohol intake, vegetables or greens since the last visit:  No Any illnesses or hospitalizations since the last visit:  No Any signs of clotting since the last visit (including chest discomfort, dizziness, shortness of breath, arm tingling, slurred speech, swelling or redness in leg):  No  MEDICATIONS ALPRAZOLAM 0.5 MG TABS (ALPRAZOLAM) Take 1 tablet by mouth once a day HYDROCHLOROTHIAZIDE 25 MG TABS (HYDROCHLOROTHIAZIDE) Take 1 tablet by mouth every morning OMEPRAZOLE 20 MG CPDR (OMEPRAZOLE) Take 1 capsule by mouth once a day GNP ALLERGY RELIEF 10 MG  TBDP (LORATADINE) once daily WARFARIN SODIUM 5 MG TABS (WARFARIN SODIUM) once daily as directed [facility] DAILY MULTIPLE   TABS (MULTIPLE VITAMIN) 1 once daily DIGOXIN 0.125 MG  TABS (DIGOXIN) Take 1 tablet by mouth once a day SYSTANE NIGHTTIME  OINT (WHITE PETROLATUM-MINERAL OIL) once daily as directed LISINOPRIL 20 MG TABS (LISINOPRIL) 1 tablet by mouth daily SIMVASTATIN 40 MG  TABS (SIMVASTATIN) Take one tablet at bedtime

## 2010-02-09 NOTE — Progress Notes (Signed)
  Phone Note Other Incoming   Summary of Call: Pt walked in the office requesting to have her pulse checked.  Pulse was 92.....Marland Kitchenand she was having some difficulty with insomnia and concerned about her elevated pulse rate.  Advised her to make an appt with Dr. Cato Mulligan next week, and to dc any sudafed or otc allergy meds that she is taking after discussing this with a pharmacist.  She will make appt. next week. Initial call taken by: Lynann Beaver CMA,  September 20, 2006 3:31 PM

## 2010-02-09 NOTE — Progress Notes (Signed)
    Prescriptions: ALPRAZOLAM 0.5 MG TABS (ALPRAZOLAM) Take 1 tablet by mouth once a day  #30 x 1   Entered by:   Lynann Beaver CMA   Authorized by:   [name]   Signed by:   Lynann Beaver CMA on 08/22/2006   Method used:   Electronically sent to ...       [facility]  [phone]       [address]       [place], [place]  [address]       Ph: [phone] or [phone]       Fax: [phone]   RxID:   [id number]   This prescription was called in also.........Marland KitchenMarland KitchenSpoke to the pharmacy, and pt. did not get any Alprazolam from the documented refill of 08/19/2006.  This was never faxed.

## 2010-02-09 NOTE — Assessment & Plan Note (Signed)
Summary: aching all over/dm   Vital Signs:  Patient Profile:   77 Years Old Female Weight:      138 pounds O2 Sat:      95 % Temp:     97.9 degrees F Pulse rate:   114 / minute BP sitting:   140 / 80  (left arm) Cuff size:   regular  Vitals Entered By: Pura Spice, RN (March 02, 2008 1:38 PM)                 Chief Complaint:  Acheing. Neck hurts. Feels dizzy. Marland Kitchen  History of Present Illness: This patient has felt achy and badsince last of Dec, achy joints and muscle also supra pubic pain at times , no definate UTI symptoms, no fever, no cough but nasal congestion and drainageHas stopped allopurinol thinking it was causing aching. Pt has hx atrial fib and is on coumadin to try celebrex for arthritis no URI symptoms, no diarrhea or GI symptoms    Current Allergies: ! ACETYL SALICYLIC ACID (ASPIRIN)     Review of Systems      See HPI  General      Complains of fever, loss of appetite, malaise, and weakness.  ENT      Denies decreased hearing, difficulty swallowing, ear discharge, earache, hoarseness, nasal congestion, nosebleeds, postnasal drainage, ringing in ears, sinus pressure, and sore throat.  CV      Complains of palpitations.      hx atrial Fib  Resp      Denies chest discomfort, chest pain with inspiration, cough, coughing up blood, excessive snoring, hypersomnolence, morning headaches, pleuritic, shortness of breath, sputum productive, and wheezing.  GI      Complains of indigestion.      hx gerd, omeprazole  GU      Denies abnormal vaginal bleeding, decreased libido, discharge, dysuria, genital sores, hematuria, incontinence, nocturia, urinary frequency, and urinary hesitancy.      supra pubic pain  episodic  MS      Complains of joint pain.      hx gout   Physical Exam  General:     Well-developed,well-nourished,in no acute distress; alert,appropriate and cooperative throughout examination Head:     Normocephalic and atraumatic without  obvious abnormalities. No apparent alopecia or balding. Eyes:     No corneal or conjunctival inflammation noted. EOMI. Perrla. Funduscopic exam benign, without hemorrhages, exudates or papilledema. Vision grossly normal. Ears:     External ear exam shows no significant lesions or deformities.  Otoscopic examination reveals clear canals, tympanic membranes are intact bilaterally without bulging, retraction, inflammation or discharge. Hearing is grossly normal bilaterally. Nose:     External nasal examination shows no deformity or inflammation. Nasal mucosa are pink and moist without lesions or exudates. Mouth:     Oral mucosa and oropharynx without lesions or exudates.  Teeth in good repair. Neck:     cervical spine tenderness Lungs:     Normal respiratory effort, chest expands symmetrically. Lungs are clear to auscultation, no crackles or wheezes. Heart:     Normal rate and regular rhythm. S1 and S2 normal without gallop, murmur, click, rub or other extra sounds. Abdomen:     suprpubic tenderness Rectal:     NE Msk:     Cervical spine tenderness MP joint tender 1st toe  rt foot, also bunion Pulses:     irregular rate Extremities:     No clubbing, cyanosis, edema, or deformity noted with normal full  range of motion of all joints.   Neurologic:     No cranial nerve deficits noted. Station and gait are normal. Plantar reflexes are down-going bilaterally. DTRs are symmetrical throughout. Sensory, motor and coordinative functions appear intact. Skin:     Intact without suspicious lesions or rashes Psych:     Cognition and judgment appear intact. Alert and cooperative with normal attention span and concentration. No apparent delusions, illusions, hallucinations    Impression & Recommendations:  Problem # 1:  UTI (ICD-599.0) Assessment: New  Her updated medication list for this problem includes:    Ciprofloxacin Hcl 500 Mg Tabs (Ciprofloxacin hcl) .Marland Kitchen... 1 two times a day  for  infection  Orders: UA Dipstick w/o Micro (manual) (91478) TLB-CBC Platelet - w/Differential (85025-CBCD)   Problem # 2:  ARTHRITIS (ICD-716.90) Assessment: Unchanged  Problem # 3:  COUMADIN THERAPY (ICD-V58.61) Assessment: Deteriorated  Orders: Protime (29562ZH)   Problem # 4:  ENCOUNTER FOR THERAPEUTIC DRUG MONITORING (ICD-V58.83) Assessment: Unchanged  Orders: Protime (08657QI)   Problem # 5:  ATRIAL FIBRILLATION (ICD-427.31) Assessment: Unchanged  Her updated medication list for this problem includes:    Warfarin Sodium 5 Mg Tabs (Warfarin sodium) ..... Once daily as directed    Digoxin 0.125 Mg Tabs (Digoxin) .Marland Kitchen... Take 1 tablet by mouth once a day  Orders: Protime (69629BM)   Problem # 6:  URI (ICD-465.9) Assessment: Improved  Her updated medication list for this problem includes:    Gnp Allergy Relief 10 Mg Tbdp (Loratadine) ..... Once daily  Orders: Venipuncture (84132) TLB-CBC Platelet - w/Differential (85025-CBCD)   Complete Medication List: 1)  Alprazolam 0.5 Mg Tabs (Alprazolam) .... Take 1 tablet by mouth once a day 2)  Hydrochlorothiazide 25 Mg Tabs (Hydrochlorothiazide) .... Take 1 tablet by mouth every morning 3)  Omeprazole 20 Mg Cpdr (Omeprazole) .... Take 1 capsule by mouth once a day 4)  Vytorin 10-40 Mg Tabs (Ezetimibe-simvastatin) .... Take 1 tablet by mouth once a day 5)  Gnp Allergy Relief 10 Mg Tbdp (Loratadine) .... Once daily 6)  Warfarin Sodium 5 Mg Tabs (Warfarin sodium) .... Once daily as directed 7)  [facility] Daily Multiple Tabs (Multiple vitamin) .Marland Kitchen.. 1 once daily 8)  Digoxin 0.125 Mg Tabs (Digoxin) .... Take 1 tablet by mouth once a day 9)  Systane Nighttime Oint (White petrolatum-mineral oil) .... Once daily as directed 10)  Lisinopril 20 Mg Tabs (Lisinopril) .Marland Kitchen.. 1 tablet by mouth daily 11)  Allopurinol 300 Mg Tabs (Allopurinol) .... Take 1 tab every day 12)  Ciprofloxacin Hcl 500 Mg Tabs (Ciprofloxacin hcl) .Marland Kitchen.. 1 two times a day   for infection   Patient Instructions: 1)  take celebrex two times a day 2)  for joint achingor arthritis 3)  Has urinary tract infection, to takke cipro 500mg  two times a day  4)  for infection 5)  Have a good fluid  intake, recheck urinalysis on visit to see Dr. Cato Mulligan   Prescriptions: CIPROFLOXACIN HCL 500 MG TABS (CIPROFLOXACIN HCL) 1 two times a day  for infection  #30 x 0   Entered and Authorized by:   Judithann Sheen MD   Signed by:   Judithann Sheen MD on 03/02/2008   Method used:   Electronically to        [facility]  [phone]* (retail)       [address] [place], [place]  [address]       Ph: [phone]  [phone] or [phone]       Fax: [phone]   RxID:   [id number]   Laboratory Results   Urine Tests   Date/Time Reported: March 02, 2008 2:28 PM   Routine Urinalysis   Color: yellow Appearance: Clear Glucose: negative   (Normal Range: Negative) Bilirubin: negative   (Normal Range: Negative) Ketone: negative   (Normal Range: Negative) Spec. Gravity: <1.005   (Normal Range: 1.003-1.035) Blood: negative   (Normal Range: Negative) pH: 5.5   (Normal Range: 5.0-8.0) Protein: negative   (Normal Range: Negative) Urobilinogen: 0.2   (Normal Range: 0-1) Nitrite: positive   (Normal Range: Negative) Leukocyte Esterace: 1+   (Normal Range: Negative)    Comments: [name], CMA  March 02, 2008 2:28 PM   Blood Tests    Date/Time Reported: March 02, 2008 2:25 PM   PT: 15.6 s   (Normal Range: 10.6-13.4)  INR: 1.6   (Normal Range: 0.88-1.12   Therap INR: 2.0-3.5) Comments: [name], CMA  March 02, 2008 2:25 PM       ANTICOAGULATION RECORD PREVIOUS REGIMEN & LAB RESULTS Anticoagulation Diagnosis:  Atrial fibrillation on  10/18/2006 Previous INR Goal Range:  2.0-3.0 on  10/18/2006 Previous INR:  4.6 on  02/10/2008 Previous Coumadin Dose(mg):  2.5mg  qd on  02/10/2008 Previous Regimen:  2.5mg  qd none  on thursdays on  02/10/2008 Previous Coagulation Comments:  Approved by Dr. Lovell Sheehan. on  10/22/2007  NEW REGIMEN & LAB RESULTS Current INR: 1.6 Regimen: 2.5mg  qd        Repeat testing in: next appt. with Dr. MEDICATIONS ALPRAZOLAM 0.5 MG TABS (ALPRAZOLAM) Take 1 tablet by mouth once a day HYDROCHLOROTHIAZIDE 25 MG TABS (HYDROCHLOROTHIAZIDE) Take 1 tablet by mouth every morning OMEPRAZOLE 20 MG CPDR (OMEPRAZOLE) Take 1 capsule by mouth once a day VYTORIN 10-40 MG TABS (EZETIMIBE-SIMVASTATIN) Take 1 tablet by mouth once a day GNP ALLERGY RELIEF 10 MG  TBDP (LORATADINE) once daily WARFARIN SODIUM 5 MG TABS (WARFARIN SODIUM) once daily as directed [facility] DAILY MULTIPLE   TABS (MULTIPLE VITAMIN) 1 once daily DIGOXIN 0.125 MG  TABS (DIGOXIN) Take 1 tablet by mouth once a day SYSTANE NIGHTTIME  OINT (WHITE PETROLATUM-MINERAL OIL) once daily as directed LISINOPRIL 20 MG TABS (LISINOPRIL) 1 tablet by mouth daily ALLOPURINOL 300 MG TABS (ALLOPURINOL) Take 1 tab every day CIPROFLOXACIN HCL 500 MG TABS (CIPROFLOXACIN HCL) 1 two times a day  for infection

## 2010-02-09 NOTE — Assessment & Plan Note (Signed)
Summary: pt/mm   Nurse Visit   Vital Signs:  Patient profile:   77 year old female Pulse rate:   91 / minute BP sitting:   129 / 80    Allergies: 1)  ! Acetyl Salicylic Acid (Aspirin)  Laboratory Results   Blood Tests     PT: 15.9 s   (Normal Range: 10.6-13.4)  INR: 1.7   (Normal Range: 0.88-1.12   Therap INR: 2.0-3.5) Comments: [name]  April 23, 2008 10:00 AM       Orders Added: 1)  Est. Patient Level I [99211] 2)  Fingerstick [36416] 3)  Protime [[id number]]      ANTICOAGULATION RECORD PREVIOUS REGIMEN & LAB RESULTS Anticoagulation Diagnosis:  Atrial fibrillation on  10/18/2006 Previous INR Goal Range:  2.0-3.0 on  10/18/2006 Previous INR:  3.8 on  03/24/2008 Previous Coumadin Dose(mg):  2.5mg  qd on  02/10/2008 Previous Regimen:  none Mon. or Thurs. 2.5 all other days on  03/24/2008 Previous Coagulation Comments:  Approved by Dr. Lovell Sheehan. on  10/22/2007  NEW REGIMEN & LAB RESULTS Current INR: 1.7 Regimen: none Mon. or Thurs. 2.5 all other days  (no change)  Repeat testing in: 1 month  Anticoagulation Visit Questionnaire Coumadin dose missed/changed:  No Abnormal Bleeding Symptoms:  No  Any diet changes including alcohol intake, vegetables or greens since the last visit:  No Any illnesses or hospitalizations since the last visit:  No Any signs of clotting since the last visit (including chest discomfort, dizziness, shortness of breath, arm tingling, slurred speech, swelling or redness in leg):  Yes  MEDICATIONS ALPRAZOLAM 0.5 MG TABS (ALPRAZOLAM) Take 1 tablet by mouth once a day HYDROCHLOROTHIAZIDE 25 MG TABS (HYDROCHLOROTHIAZIDE) Take 1 tablet by mouth every morning OMEPRAZOLE 20 MG CPDR (OMEPRAZOLE) Take 1 capsule by mouth once a day VYTORIN 10-40 MG TABS (EZETIMIBE-SIMVASTATIN) Take 1 tablet by mouth once a day GNP ALLERGY RELIEF 10 MG  TBDP (LORATADINE) once daily WARFARIN SODIUM 5 MG TABS (WARFARIN SODIUM) once daily as directed [facility]  DAILY MULTIPLE   TABS (MULTIPLE VITAMIN) 1 once daily DIGOXIN 0.125 MG  TABS (DIGOXIN) Take 1 tablet by mouth once a day SYSTANE NIGHTTIME  OINT (WHITE PETROLATUM-MINERAL OIL) once daily as directed LISINOPRIL 20 MG TABS (LISINOPRIL) 1 tablet by mouth daily      Vital Signs:  Patient Profile:   77 year old female Pulse rate:   91 / minute BP sitting:   129 / 80  (left arm)

## 2010-02-09 NOTE — Assessment & Plan Note (Signed)
Summary: pt/Deanna [name]   Nurse Visit   Allergies: 1)  ! Acetyl Salicylic Acid (Aspirin) Laboratory Results   Blood Tests   Date/Time Received: October 14, 2009 9:35 AM  Date/Time Reported: October 14, 2009 9:35 AM    INR: 2.2   (Normal Range: 0.88-1.12   Therap INR: 2.0-3.5) Comments: [name], CMA  October 14, 2009 9:35 AM     Orders Added: 1)  Est. Patient Level I [99211] 2)  Protime [[id number]]  Laboratory Results   Blood Tests      INR: 2.2   (Normal Range: 0.88-1.12   Therap INR: 2.0-3.5) Comments: [name], CMA  October 14, 2009 9:35 AM       ANTICOAGULATION RECORD PREVIOUS REGIMEN & LAB RESULTS Anticoagulation Diagnosis:  Atrial fibrillation on  10/18/2006 Previous INR Goal Range:  2.0-3.0 on  10/18/2006 Previous INR:  2.8 on  09/16/2009 Previous Coumadin Dose(mg):  None only on  thursdays then 2.5mg  qd on  08/04/2009 Previous Regimen:  same on  09/16/2009 Previous Coagulation Comments:  Patient was asked to recheck her coumadin in 2 weeks but she decided to check it in 4 weeks. on  08/04/2009  NEW REGIMEN & LAB RESULTS Current INR: 2.2 Regimen: same  (no change)       Repeat testing in: 4 weeks MEDICATIONS ALPRAZOLAM 0.5 MG TABS (ALPRAZOLAM) Take 1 tablet by mouth once a day OMEPRAZOLE 20 MG CPDR (OMEPRAZOLE) Take 1 capsule by mouth once a day GNP ALLERGY RELIEF 10 MG  TBDP (LORATADINE) once daily WARFARIN SODIUM 5 MG TABS (WARFARIN SODIUM) once daily as directed [facility] DAILY MULTIPLE   TABS (MULTIPLE VITAMIN) 1 once daily DIGOXIN 0.125 MG  TABS (DIGOXIN) Take 1 tablet by mouth once a day LISINOPRIL 20 MG TABS (LISINOPRIL) 1 tablet by mouth daily CRESTOR 20 MG TABS (ROSUVASTATIN CALCIUM) Take 1 tablet by mouth at bedtime MELOXICAM 7.5 MG  TABS (MELOXICAM) one by mouth daily as needed OPTIVE 0.5-0.9 % SOLN (CARBOXYMETHYLCELLUL-GLYCERIN)    Anticoagulation Visit Questionnaire      Coumadin dose missed/changed:  No      Abnormal Bleeding  Symptoms:  No   Any diet changes including alcohol intake, vegetables or greens since the last visit:  No Any illnesses or hospitalizations since the last visit:  No Any signs of clotting since the last visit (including chest discomfort, dizziness, shortness of breath, arm tingling, slurred speech, swelling or redness in leg):  No

## 2010-02-09 NOTE — Assessment & Plan Note (Signed)
Summary: pt/Deanna [name]   Nurse Visit   Vital Signs:  Patient Profile:   77 Years Old Female Pulse rate:   88 / minute BP sitting:   147 / 93  (left arm)                 Prior Medications: ALPRAZOLAM 0.5 MG TABS (ALPRAZOLAM) Take 1 tablet by mouth once a day HYDROCHLOROTHIAZIDE 25 MG TABS (HYDROCHLOROTHIAZIDE) Take 1 tablet by mouth every morning OMEPRAZOLE 20 MG CPDR (OMEPRAZOLE) Take 1 capsule by mouth once a day VYTORIN 10-40 MG TABS (EZETIMIBE-SIMVASTATIN) Take 1 tablet by mouth once a day GNP ALLERGY RELIEF 10 MG  TBDP (LORATADINE) once daily WARFARIN SODIUM 5 MG TABS (WARFARIN SODIUM) once daily as directed [facility] DAILY MULTIPLE   TABS (MULTIPLE VITAMIN) 1 once daily DIGOXIN 0.125 MG  TABS (DIGOXIN) Take 1 tablet by mouth once a day SYSTANE NIGHTTIME  OINT (WHITE PETROLATUM-MINERAL OIL) once daily as directed LISINOPRIL 20 MG TABS (LISINOPRIL) 1 tablet by mouth daily Current Allergies: ! ACETYL SALICYLIC ACID (ASPIRIN) Laboratory Results   Blood Tests    Date/Time Reported: February 10, 2008 12:35 PM   PT: 26.1 s   (Normal Range: 10.6-13.4)  INR: 4.6   (Normal Range: 0.88-1.12   Therap INR: 2.0-3.5) Comments: Deanna [name], CMA  February 10, 2008 12:35 PM       Orders Added: 1)  Est. Patient Level I [99211] 2)  Protime [[id number]] 3)  Fingerstick Xanthus.Diener    ]  Vital Signs:  Patient Profile:   77 Years Old Female Pulse rate:   88 / minute BP sitting:   147 / 93               Laboratory Results   Blood Tests     PT: 26.1 s   (Normal Range: 10.6-13.4)  INR: 4.6   (Normal Range: 0.88-1.12   Therap INR: 2.0-3.5) Comments: Deanna [name], CMA  February 10, 2008 12:35 PM       ANTICOAGULATION RECORD PREVIOUS REGIMEN & LAB RESULTS Anticoagulation Diagnosis:  Atrial fibrillation on  10/18/2006 Previous INR Goal Range:  2.0-3.0 on  10/18/2006 Previous INR:  1.4 on  01/26/2008 Previous Coumadin Dose(mg):  2.5X5DAYS/NONE  ON TU,TH on   06/18/2007 Previous Regimen:  2.5 QD on  01/26/2008 Previous Coagulation Comments:  Approved by Dr. Lovell Sheehan. on  10/22/2007  NEW REGIMEN & LAB RESULTS Current INR: 4.6 Current Coumadin Dose(mg): 2.5mg  qd Regimen: 2.5mg  qd none on thursdays       Repeat testing in: 4 weeks MEDICATIONS ALPRAZOLAM 0.5 MG TABS (ALPRAZOLAM) Take 1 tablet by mouth once a day HYDROCHLOROTHIAZIDE 25 MG TABS (HYDROCHLOROTHIAZIDE) Take 1 tablet by mouth every morning OMEPRAZOLE 20 MG CPDR (OMEPRAZOLE) Take 1 capsule by mouth once a day VYTORIN 10-40 MG TABS (EZETIMIBE-SIMVASTATIN) Take 1 tablet by mouth once a day GNP ALLERGY RELIEF 10 MG  TBDP (LORATADINE) once daily WARFARIN SODIUM 5 MG TABS (WARFARIN SODIUM) once daily as directed [facility] DAILY MULTIPLE   TABS (MULTIPLE VITAMIN) 1 once daily DIGOXIN 0.125 MG  TABS (DIGOXIN) Take 1 tablet by mouth once a day SYSTANE NIGHTTIME  OINT (WHITE PETROLATUM-MINERAL OIL) once daily as directed LISINOPRIL 20 MG TABS (LISINOPRIL) 1 tablet by mouth daily   Anticoagulation Visit Questionnaire      Coumadin dose missed/changed:  No      Abnormal Bleeding Symptoms:  No   Any diet changes including alcohol intake, vegetables or greens since the last visit:  No Any illnesses or hospitalizations since the last visit:  No Any signs of clotting since the last visit (including chest discomfort, dizziness, shortness of breath, arm tingling, slurred speech, swelling or redness in leg):  No

## 2010-02-09 NOTE — Assessment & Plan Note (Signed)
Summary: pt/Deanna [name]   Nurse Visit   Allergies: 1)  ! Acetyl Salicylic Acid (Aspirin) Laboratory Results   Blood Tests   Date/Time Received: August 04, 2009 9:48 AM  Date/Time Reported: August 04, 2009 9:48 AM     INR: 3.5   (Normal Range: 0.88-1.12   Therap INR: 2.0-3.5) Comments: [name], CMA  August 04, 2009 9:48 AM     Orders Added: 1)  Est. Patient Level I [99211] 2)  Protime [[id number]]  Laboratory Results   Blood Tests      INR: 3.5   (Normal Range: 0.88-1.12   Therap INR: 2.0-3.5) Comments: [name], CMA  August 04, 2009 9:48 AM       ANTICOAGULATION RECORD PREVIOUS REGIMEN & LAB RESULTS Anticoagulation Diagnosis:  Atrial fibrillation on  10/18/2006 Previous INR Goal Range:  2.0-3.0 on  10/18/2006 Previous INR:  3.7 on  07/06/2009 Previous Coumadin Dose(mg):  2.5mg  qd on  02/28/2009 Previous Regimen:  Same Dose on  06/02/2009 Previous Coagulation Comments:  Hold for a day,then same dose on  07/06/2009  NEW REGIMEN & LAB RESULTS Current INR: 3.5 Current Coumadin Dose(mg): None only on  thursdays then 2.5mg  qd Regimen: Same Dose  (no change) Coagulation Comments: Patient was asked to recheck her coumadin in 2 weeks but she decided to check it in 4 weeks.      Repeat testing in: 2 weeks MEDICATIONS ALPRAZOLAM 0.5 MG TABS (ALPRAZOLAM) Take 1 tablet by mouth once a day HYDROCHLOROTHIAZIDE 25 MG TABS (HYDROCHLOROTHIAZIDE) Take 1 tablet by mouth every morning OMEPRAZOLE 20 MG CPDR (OMEPRAZOLE) Take 1 capsule by mouth once a day GNP ALLERGY RELIEF 10 MG  TBDP (LORATADINE) once daily WARFARIN SODIUM 5 MG TABS (WARFARIN SODIUM) once daily as directed [facility] DAILY MULTIPLE   TABS (MULTIPLE VITAMIN) 1 once daily DIGOXIN 0.125 MG  TABS (DIGOXIN) Take 1 tablet by mouth once a day LISINOPRIL 20 MG TABS (LISINOPRIL) 1 tablet by mouth daily CRESTOR 20 MG TABS (ROSUVASTATIN CALCIUM) Take 1 tablet by mouth at bedtime MELOXICAM 7.5 MG  TABS (MELOXICAM) one by mouth  daily as needed   Anticoagulation Visit Questionnaire      Coumadin dose missed/changed:  No      Abnormal Bleeding Symptoms:  No   Any diet changes including alcohol intake, vegetables or greens since the last visit:  No Any illnesses or hospitalizations since the last visit:  No Any signs of clotting since the last visit (including chest discomfort, dizziness, shortness of breath, arm tingling, slurred speech, swelling or redness in leg):  No

## 2010-02-09 NOTE — Assessment & Plan Note (Signed)
Summary: 3 months f/up rescheduled from 6/23/db   Vital Signs:  Patient Profile:   77 Years Old Female Weight:      136 pounds Temp:     98.2 degrees F Pulse rate:   72 / minute Pulse rhythm:   irregular BP sitting:   132 / 70  (left arm)  Vitals Entered By: Gladis Riffle, RN (June 18, 2007 3:37 PM)                 Chief Complaint:  rov--states metoprolol was dc'd.  History of Present Illness:  Follow-Up Visit      This is a 77 year old woman who presents for Follow-up visit.  The patient denies chest pain, palpitations, dizziness, syncope, low blood sugar symptoms, high blood sugar symptoms, edema, SOB, DOE, PND, and orthopnea.  Since the last visit the patient notes no new problems or concerns.  The patient reports taking meds as prescribed and not monitoring BP.  When questioned about possible medication side effects, the patient notes none.    Past Medical History: Anxiety GERD-there was a qustion of Barrett's-not documented on Bx Hyperlipidemia Hypertension Mohs procedure.  Atrial fibrillation Colon Polyps  Past Surgical History: bcc-face Hysterectomy  Social History: Retired Regular exercise-no Married  Family History: Family History of Colon CA 1st degree relative <60 Family History Breast cancer 1st degree relative <50 Family History Hypertension  Social History: Retired Regular exercise-no Married      Current Allergies (reviewed today): ! ACETYL SALICYLIC ACID (ASPIRIN)        Impression & Recommendations:  Problem # 1:  ATRIAL FIBRILLATION (ICD-427.31) doing well  no sxs no bleeding total time 15 minutes The following medications were removed from the medication list:    Metoprolol Tartrate 50 Mg Tabs (Metoprolol tartrate) .Marland Kitchen... 1 by mouth two times a day  Her updated medication list for this problem includes:    Warfarin Sodium 5 Mg Tabs (Warfarin sodium) ..... Once daily as directed    Digoxin 0.125 Mg Tabs (Digoxin) .Marland Kitchen... Take 1  tablet by mouth once a day  Orders: Fingerstick (65784) Protime ([id number])   Problem # 2:  HYPERGLYCEMIA (ICD-790.29)  Labs Reviewed: HgBA1c: 6.3 (04/01/2007)   Creat: 1.1 (04/01/2007)      Problem # 3:  HYPERLIPIDEMIA (ICD-272.4)  Her updated medication list for this problem includes:    Vytorin 10-40 Mg Tabs (Ezetimibe-simvastatin) .Marland Kitchen... Take 1 tablet by mouth once a day  Labs Reviewed: Chol: 193 (04/01/2007)   HDL: 33.6 (04/01/2007)   LDL: DEL (04/01/2007)   TG: 385 (04/01/2007) SGOT: 35 (04/01/2007)   SGPT: 30 (04/01/2007)  Lipid Goals: Chol Goal: 200 (10/15/2006)   HDL Goal: 40 (10/15/2006)   LDL Goal: 130 (10/15/2006)   TG Goal: 150 (10/15/2006)  Prior 10 Yr Risk Heart Disease: Not enough information (10/15/2006)   Complete Medication List: 1)  Alprazolam 0.5 Mg Tabs (Alprazolam) .... Take 1 tablet by mouth once a day 2)  Hydrochlorothiazide 25 Mg Tabs (Hydrochlorothiazide) .... Take 1 tablet by mouth every morning 3)  Omeprazole 20 Mg Cpdr (Omeprazole) .... Take 1 capsule by mouth once a day 4)  Vytorin 10-40 Mg Tabs (Ezetimibe-simvastatin) .... Take 1 tablet by mouth once a day 5)  Gnp Allergy Relief 10 Mg Tbdp (Loratadine) .... Once daily 6)  Warfarin Sodium 5 Mg Tabs (Warfarin sodium) .... Once daily as directed 7)  [facility] Daily Multiple Tabs (Multiple vitamin) .Marland Kitchen.. 1 once daily 8)  Digoxin 0.125 Mg Tabs (Digoxin) .... Take  1 tablet by mouth once a day 9)  Sustane Eye Drops  .... Qd 10)  Lisinopril 20 Mg Tabs (Lisinopril) .Marland Kitchen.. 1 tablet by mouth daily   Patient Instructions: 1)  GET your protime checked monthly --- Riley Nearing is your responsibility 2)  Please schedule a follow-up appointment in 3 months. 3)  labs one week prior to visit 4)  lipids---272.4 5)  lfts-995.2 6)  bmet-995.2 7)  A1C-250.02 8)      ]  ANTICOAGULATION RECORD PREVIOUS REGIMEN & LAB RESULTS Anticoagulation Diagnosis:  Atrial fibrillation on  10/18/2006 Previous INR Goal Range:  2.0-3.0 on   10/18/2006 Previous INR:  2.5 on  05/15/2007 Previous Coumadin Dose(mg):  2.5qd/none on tu,th on  05/15/2007 Previous Regimen:  same on  05/15/2007 Previous Coagulation Comments:  OV on  04/01/2007  NEW REGIMEN & LAB RESULTS Current INR: 2.4 Current Coumadin Dose(mg): 2.5X5DAYS/NONE  ON TU,TH Regimen: SAME  Hanif Radin: 1289      Repeat testing in: 4WEEKS MEDICATIONS ALPRAZOLAM 0.5 MG TABS (ALPRAZOLAM) Take 1 tablet by mouth once a day HYDROCHLOROTHIAZIDE 25 MG TABS (HYDROCHLOROTHIAZIDE) Take 1 tablet by mouth every morning OMEPRAZOLE 20 MG CPDR (OMEPRAZOLE) Take 1 capsule by mouth once a day VYTORIN 10-40 MG TABS (EZETIMIBE-SIMVASTATIN) Take 1 tablet by mouth once a day GNP ALLERGY RELIEF 10 MG  TBDP (LORATADINE) once daily WARFARIN SODIUM 5 MG TABS (WARFARIN SODIUM) once daily as directed [facility] DAILY MULTIPLE   TABS (MULTIPLE VITAMIN) 1 once daily DIGOXIN 0.125 MG  TABS (DIGOXIN) Take 1 tablet by mouth once a day * SUSTANE EYE DROPS qd LISINOPRIL 20 MG TABS (LISINOPRIL) 1 tablet by mouth daily   Anticoagulation Visit Questionnaire      Coumadin dose missed/changed:  No      Abnormal Bleeding Symptoms:  No   Any diet changes including alcohol intake, vegetables or greens since the last visit:  No Any illnesses or hospitalizations since the last visit:  No Any signs of clotting since the last visit (including chest discomfort, dizziness, shortness of breath, arm tingling, slurred speech, swelling or redness in leg):  No   Laboratory Results   Blood Tests     PT: 18.8 s   (Normal Range: 10.6-13.4)  INR: 2.4   (Normal Range: 0.88-1.12   Therap INR: 2.0-3.5) Comments: Milica Zimonjic  June 18, 2007 3:45 PM

## 2010-02-09 NOTE — Progress Notes (Signed)
Summary: pt req lab results/cjr  Phone Note Call from Patient Call back at Home Phone [phone]   Caller: Patient Summary of Call: Pt called to get lab results. Please call asap.  Initial call taken by: Lucy Antigua,  November 10, 2008 12:21 PM  Follow-up for Phone Call        cholesterol 236 and triglyceride 810.Marland Kitchen Low fat diet and avoid carbs--anything else? Follow-up by: Gladis Riffle, RN,  November 10, 2008 3:27 PM  Additional Follow-up for Phone Call Additional follow up Details #1::        she needs to take simvastain every day---it does not appear she is doing so 3 months see me lipids 272.4 liver 995.2  Additional Follow-up by: [name],  November 10, 2008 4:39 PM    Additional Follow-up for Phone Call Additional follow up Details #2::    Left message on machine. Pt to call back. Gladis Riffle, RN  November 11, 2008 2:10 PM   Pt states she takes simvastatin as ordered.  Instructed to do low fat diet and to limit sweets and carbs as well.  Lab appt made. Follow-up by: Gladis Riffle, RN,  November 11, 2008 3:37 PM

## 2010-02-09 NOTE — Progress Notes (Signed)
Summary: aching all over  Phone Note Call from Patient   Caller: Patient walks in office Call For: [name] Summary of Call: Pt walks in office complaining of mylagias x 4 days and requesting to be seen by a MD.  Instructed to come back at 1: 30 pm to see Dr. Scotty Court.  Dr. Cato Mulligan is out of the office today.   Initial call taken by: Lynann Beaver CMA,  March 02, 2008 9:55 AM

## 2010-02-09 NOTE — Progress Notes (Signed)
Summary: Lab work/Gout Issue  Phone Note Call from Patient Call back at Home Phone [phone] Call back at walk in   Reason for Call: Talk to Nurse Summary of Call: Wants to know if she can come in earlier for her lab work than Dec. 14th and also is having issues with her left foot & wants to come in as soon as possible. Please give her a call [phone]. Thank You!! Initial call taken by: Georgian Co,  December 14, 2009 12:06 PM  Follow-up for Phone Call        Pt called and is req to get lab work done. Pls call back today.  Follow-up by: Lucy Antigua,  December 14, 2009 2:53 PM  Additional Follow-up for Phone Call Additional follow up Details #1::        can come in this week Additional Follow-up by: Alfred Levins, CMA,  December 14, 2009 4:21 PM    Additional Follow-up for Phone Call Additional follow up Details #2::    Pt has been scheduled for appt with Dr Cato Mulligan on  12/16/09  at  10:45am.  Pt wants to know if she should continue taking the Colchicine  that she has gotten from her son.... NOT her prescription.....?  She said that her son ckd with Pharmacist and they said she should be able to take it even though it is NOT her Rx???????...Marland KitchenMarland KitchenPt requests a return call to advise  #  [phone].   Follow-up by: Debbra Riding,  December 14, 2009 4:56 PM  Additional Follow-up for Phone Call Additional follow up Details #3:: Details for Additional Follow-up Action Taken: if it is working she can take it once a day. Additional Follow-up by: [name],  December 15, 2009 6:11 AM  pt aware  Alfred Levins, CMA  December 16, 2009 10:48 AM

## 2010-02-09 NOTE — Assessment & Plan Note (Signed)
Summary: flu shot/et/pt rsc/cjr/PT RESCD//CCM   Nurse Visit   Allergies: 1)  ! Acetyl Salicylic Acid (Aspirin)  Orders Added: 1)  Flu Vaccine 80yrs + [90658] 2)  Administration Flu vaccine - MCR [G0008] Flu Vaccine Consent Questions     Do you have a history of severe allergic reactions to this vaccine? no    Any prior history of allergic reactions to egg and/or gelatin? no    Do you have a sensitivity to the preservative Thimersol? no    Do you have a past history of Guillan-Barre Syndrome? no    Do you currently have an acute febrile illness? no    Have you ever had a severe reaction to latex? no    Vaccine information given and explained to patient? yes    Are you currently pregnant? no    Lot Number:[id number]   Exp Date:07/07/2009   Site Given  Left Deltoid IMu

## 2010-02-09 NOTE — Assessment & Plan Note (Signed)
Summary: pt/Deanna [name]   Nurse Visit   Allergies: 1)  ! Acetyl Salicylic Acid (Aspirin) Laboratory Results   Blood Tests   Date/Time Received: May 05, 2009 2:18 PM  Date/Time Reported: May 05, 2009 2:18 PM   PT: 20.5 s   (Normal Range: 10.6-13.4)  INR: 2.9   (Normal Range: 0.88-1.12   Therap INR: 2.0-3.5) Comments: [name], CMA  May 05, 2009 2:18 PM     Orders Added: 1)  Est. Patient Level I [99211] 2)  Protime [[id number]]  Laboratory Results   Blood Tests     PT: 20.5 s   (Normal Range: 10.6-13.4)  INR: 2.9   (Normal Range: 0.88-1.12   Therap INR: 2.0-3.5) Comments: [name], CMA  May 05, 2009 2:18 PM       ANTICOAGULATION RECORD PREVIOUS REGIMEN & LAB RESULTS Anticoagulation Diagnosis:  Atrial fibrillation on  10/18/2006 Previous INR Goal Range:  2.0-3.0 on  10/18/2006 Previous INR:  2.7 on  04/04/2009 Previous Coumadin Dose(mg):  2.5mg  qd on  02/28/2009 Previous Regimen:  2.5mg  qd on  02/28/2009 Previous Coagulation Comments:  Dr Lovell Sheehan approved on  01/26/2009  NEW REGIMEN & LAB RESULTS Current INR: 2.9 Regimen: 2.5mg  qd  (no change)       Repeat testing in: 4 weeks MEDICATIONS ALPRAZOLAM 0.5 MG TABS (ALPRAZOLAM) Take 1 tablet by mouth once a day HYDROCHLOROTHIAZIDE 25 MG TABS (HYDROCHLOROTHIAZIDE) Take 1 tablet by mouth every morning OMEPRAZOLE 20 MG CPDR (OMEPRAZOLE) Take 1 capsule by mouth once a day GNP ALLERGY RELIEF 10 MG  TBDP (LORATADINE) once daily WARFARIN SODIUM 5 MG TABS (WARFARIN SODIUM) once daily as directed [facility] DAILY MULTIPLE   TABS (MULTIPLE VITAMIN) 1 once daily DIGOXIN 0.125 MG  TABS (DIGOXIN) Take 1 tablet by mouth once a day SYSTANE NIGHTTIME  OINT (WHITE PETROLATUM-MINERAL OIL) once daily as directed LISINOPRIL 20 MG TABS (LISINOPRIL) 1 tablet by mouth daily SIMVASTATIN 40 MG TABS (SIMVASTATIN) Take one tablet at bedtime CELEBREX 200 MG CAPS (CELECOXIB) Take 1 tablet by mouth once a day as needed.  Not to  exceed 2 per week   Anticoagulation Visit Questionnaire      Coumadin dose missed/changed:  No      Abnormal Bleeding Symptoms:  No   Any diet changes including alcohol intake, vegetables or greens since the last visit:  No Any illnesses or hospitalizations since the last visit:  No Any signs of clotting since the last visit (including chest discomfort, dizziness, shortness of breath, arm tingling, slurred speech, swelling or redness in leg):  No

## 2010-02-09 NOTE — Assessment & Plan Note (Signed)
Summary: ROA/IF   Vital Signs:  Patient Profile:   77 Years Old Female Weight:      154 pounds Temp:     98 degrees F oral BP sitting:   132 / 80  Vitals Entered By: Lynann Beaver CMA (October 30, 2006 2:40 PM)                 Chief Complaint:  recheck and PT.  History of Present Illness: Afib- she feels better. no lower extremity edema. Home BPS 123-139/72-87. Heart rate 66-94. she denies any chest pain, shortness of breath, PND.  She denies any neurologic deficits.  She is tolerating her medications and warfarin therapy without difficulty.  She denies any bleeding episodes.  Most of the time she has limited herself to one drink per day.  Current Allergies: ! ACETYL SALICYLIC ACID (ASPIRIN)  Past Medical History:    Reviewed history from 10/15/2006 and no changes required:       Anxiety       GERD-there was a qustion of Barrett's-not documented on Bx       Hyperlipidemia       Hypertension       Mohs procedure.        Atrial fibrillation  Past Surgical History:    Reviewed history from 09/23/2006 and no changes required:       bcc-face       Hysterectomy   Family History:    Reviewed history from 10/15/2006 and no changes required:       Family History of Colon CA 1st degree relative <60       Family History Breast cancer 1st degree relative <50       Family History Hypertension  Social History:    Reviewed history from 09/23/2006 and no changes required:       Retired       Regular exercise-no       Married    Review of Systems       no other complaints in a complete ROS    Physical Exam  General:     Well-developed,well-nourished,in no acute distress; alert,appropriate and cooperative throughout examination Head:     Normocephalic and atraumatic without obvious abnormalities. No apparent alopecia or balding. Eyes:     pupils equal and pupils round.   Neck:     No deformities, masses, or tenderness noted. Lungs:     Normal respiratory effort,  chest expands symmetrically. Lungs are clear to auscultation, no crackles or wheezes. Heart:     no murmur, no gallop, no rub, no JVD, no HJR, and irregular rhythm.   Abdomen:     Bowel sounds positive,abdomen soft and non-tender without masses, organomegaly or hernias noted. Msk:     No deformity or scoliosis noted of thoracic or lumbar spine.   Pulses:     R and L carotid,radial,femoral,dorsalis pedis and posterior tibial pulses are full and equal bilaterally Extremities:     No clubbing, cyanosis, edema, or deformity noted  Neurologic:     No cranial nerve deficits noted. Station and gait are normal. Plantar reflexes are down-going bilaterally. DTRs are symmetrical throughout. Sensory, motor and coordinative functions appear intact.    Impression & Recommendations:  Problem # 1:  ATRIAL FIBRILLATION (ICD-427.31) continue current therapy.  Her blood pressure is adequately controlled.  Her heart rate is much better controlled.she is over anticoagulated.  She will stop the Coumadin for one day.  She resumed Coumadin 2.5 mg p.o. daily.  She will follow-up with me next week for protime and an office visit with me in 4 weeks. Her updated medication list for this problem includes:    Warfarin Sodium 5 Mg Tabs (Warfarin sodium) .Marland Kitchen... 1/2 by mouth once daily    Digoxin 0.125 Mg Tabs (Digoxin) ..... Once daily  Orders: Fingerstick (17616) Protime ([id number])  Reviewed the following: PT: 29.9 (10/30/2006)   INR: 6.1 (10/30/2006) Next Protime: next week (dated on 10/23/2006)   Complete Medication List: 1)  Alprazolam 0.5 Mg Tabs (Alprazolam) .... Take 1 tablet by mouth once a day 2)  Hydrochlorothiazide 25 Mg Tabs (Hydrochlorothiazide) .... Take 1 tablet by mouth every morning 3)  Omeprazole 20 Mg Cpdr (Omeprazole) .... Take 1 capsule by mouth once a day 4)  Vytorin 10-40 Mg Tabs (Ezetimibe-simvastatin) .... Take 1 tablet by mouth once a day 5)  Gnp Allergy Relief 10 Mg Tbdp (Loratadine)  .... ???? 6)  Warfarin Sodium 5 Mg Tabs (Warfarin sodium) .... 1/2 by mouth once daily 7)  Dilt-xr 240 Mg Cp24 (Diltiazem hcl) .Marland Kitchen.. 1 capsule once daily 8)  Cvs Daily Multiple Tabs (Multiple vitamin) .Marland Kitchen.. 1 once daily 9)  Digoxin 0.125 Mg Tabs (Digoxin) .... Once daily   Patient Instructions: 1)  stop warfarin for one day (tomorow) then resume at 2.5 mg by mouth once daily.  2)  Limit alcohol to one drink or less daily.  3)  Protime - have it checked next week. SCHEDULE APPT FOR LAB ONLY. 4)  see me in one month.     ]  ANTICOAGULATION RECORD PREVIOUS REGIMEN & LAB RESULTS Anticoagulation Diagnosis:  Atrial fibrillation on  10/18/2006 Previous INR Goal Range:  2.0-3.0 on  10/18/2006 Previous INR:  4.8 on  10/23/2006 Previous Coumadin Dose(mg):  5mg ,2mg  alt. on  10/23/2006 Previous Regimen:  2.5;2.5,5mg  on  10/23/2006  NEW REGIMEN & LAB RESULTS Current INR: 6.1 Current Coumadin Dose(mg): 29.9 Regimen: 2.5;2.5,5mg   (no change)  Provider: 1289 MEDICATIONS ALPRAZOLAM 0.5 MG TABS (ALPRAZOLAM) Take 1 tablet by mouth once a day HYDROCHLOROTHIAZIDE 25 MG TABS (HYDROCHLOROTHIAZIDE) Take 1 tablet by mouth every morning OMEPRAZOLE 20 MG CPDR (OMEPRAZOLE) Take 1 capsule by mouth once a day VYTORIN 10-40 MG TABS (EZETIMIBE-SIMVASTATIN) Take 1 tablet by mouth once a day GNP ALLERGY RELIEF 10 MG  TBDP (LORATADINE) ???? WARFARIN SODIUM 5 MG TABS (WARFARIN SODIUM) 1/2 by mouth once daily DILT-XR 240 MG  CP24 (DILTIAZEM HCL) 1 capsule once daily [facility]S DAILY MULTIPLE   TABS (MULTIPLE VITAMIN) 1 once daily DIGOXIN 0.125 MG TABS (DIGOXIN) once daily   Anticoagulation Visit Questionnaire      Coumadin dose missed/changed:  No      Abnormal Bleeding Symptoms:  No   Any diet changes including alcohol intake, vegetables or greens since the last visit:  No Any illnesses or hospitalizations since the last visit:  No Any signs of clotting since the last visit (including chest discomfort,  dizziness, shortness of breath, arm tingling, slurred speech, swelling or redness in leg):  No   Laboratory Results   Blood Tests     PT: 29.9 s   (Normal Range: 10.6-13.4)  INR: 6.1   (Normal Range: 0.88-1.12   Therap INR: 2.0-3.5) Comments: ...................................................................Milica Zimonjic  October 30, 2006 2:38 PM

## 2010-02-09 NOTE — Assessment & Plan Note (Signed)
Summary: 3 day roa-smm   Vital Signs:  Patient Profile:   77 Years Old Female Weight:      152 pounds Temp:     97.8 degrees F oral Pulse rate:   105 / minute Pulse rhythm:   irregular BP sitting:   140 / 72  (left arm)                 History of Present Illness:  Follow-Up Visit: Recently diagnosed with atrial fibrillation, tolerating new meds and wararin without dificulty. She had recent labs. Echo is pending      This is a 77 year old woman who presents for Follow-up visit.  The patient denies chest pain, palpitations, dizziness, syncope, low blood sugar symptoms, high blood sugar symptoms, edema, SOB, DOE, PND, and orthopnea.  Since the last visit the patient notes no new problems or concerns.  The patient reports taking meds as prescribed.  When questioned about possible medication side effects, the patient notes none.    Current Allergies: ! ACETYL SALICYLIC ACID (ASPIRIN)  Past Medical History:    Reviewed history from 10/15/2006 and no changes required:       Anxiety       GERD-there was a qustion of Barrett's-not documented on Bx       Hyperlipidemia       Hypertension       Mohs procedure.        Atrial fibrillation  Past Surgical History:    Reviewed history from 09/23/2006 and no changes required:       bcc-face       Hysterectomy   Family History:    Reviewed history from 10/15/2006 and no changes required:       Family History of Colon CA 1st degree relative <60       Family History Breast cancer 1st degree relative <50       Family History Hypertension  Social History:    Reviewed history from 09/23/2006 and no changes required:       Retired       Regular exercise-no       Married    Review of Systems       no other complaint in a complete ROS   Physical Exam  General:     Well-developed,well-nourished,in no acute distress; alert,appropriate and cooperative throughout examination Head:     normocephalic and atraumatic.   Eyes:  pupils equal and pupils round.   Neck:     No deformities, masses, or tenderness noted. Lungs:     Normal respiratory effort, chest expands symmetrically. Lungs are clear to auscultation, no crackles or wheezes. Heart:     irr, ir rate. no gallop, no murmur rate 94 Abdomen:     Bowel sounds positive,abdomen soft and non-tender without masses, organomegaly or hernias noted. Msk:     No deformity or scoliosis noted of thoracic or lumbar spine.   Pulses:     R and L carotid,radial,femoral,dorsalis pedis and posterior tibial pulses are full and equal bilaterally Neurologic:     No cranial nerve deficits noted. Station and gait are normal. Plantar reflexes are down-going bilaterally. DTRs are symmetrical throughout. Sensory, motor and coordinative functions appear intact. Psych:     Cognition and judgment appear intact. Alert and cooperative with normal attention span and concentration. No apparent delusions, illusions, hallucinations    Impression & Recommendations:  Problem # 1:  ATRIAL FIBRILLATION (ICD-427.31) Re discused current goals of rate control and stroke  prevention. She wants to get off of all medications. Discussed risks of untreated afib. She agrees to continued therapy. Add digoxin for better rate control.  Her updated medication list for this problem includes:    Warfarin Sodium 5 Mg Tabs (Warfarin sodium) ..... Once daily or as directed    Digoxin 0.125 Mg Tabs (Digoxin) ..... Once daily  Orders: Venipuncture (87564) Protime ([id number])   Complete Medication List: 1)  Alprazolam 0.5 Mg Tabs (Alprazolam) .... Take 1 tablet by mouth once a day 2)  Hydrochlorothiazide 25 Mg Tabs (Hydrochlorothiazide) .... Take 1 tablet by mouth every morning 3)  Omeprazole 20 Mg Cpdr (Omeprazole) .... Take 1 capsule by mouth once a day 4)  Vytorin 10-40 Mg Tabs (Ezetimibe-simvastatin) .... Take 1 tablet by mouth once a day 5)  Gnp Allergy Relief 10 Mg Tbdp (Loratadine) .... ???? 6)   Warfarin Sodium 5 Mg Tabs (Warfarin sodium) .... Once daily or as directed 7)  Dilt-xr 240 Mg Cp24 (Diltiazem hcl) .Marland Kitchen.. 1 capsule once daily 8)  [facility] Daily Multiple Tabs (Multiple vitamin) .Marland Kitchen.. 1 once daily 9)  Digoxin 0.125 Mg Tabs (Digoxin) .... Once daily   Patient Instructions: 1)  see me in approx one week 2)  Coumadin 5mg . alternate a whole tablet every other day with 1/2 tablet every other day    Prescriptions: DIGOXIN 0.125 MG TABS (DIGOXIN) once daily  #30 x 11   Entered and Authorized by:   [name]   Signed by:   [name] on 10/18/2006   Method used:   Electronically sent to ...       [id number]       [address]       [place], [place]  [address]       Ph: [phone] or [phone]       Fax: [phone]   RxID:   [id number]  ]  ANTICOAGULATION RECORD       NEW REGIMEN & LAB RESULTS Anticoag. Dx: Atrial fibrillation Current INR Goal Range: 2.0-3.0 Current INR: 2.1 Current Coumadin Dose(mg): 5mg  qd Regimen: same  Provider: [id number]      Repeat testing in: one month MEDICATIONS ALPRAZOLAM 0.5 MG TABS (ALPRAZOLAM) Take 1 tablet by mouth once a day HYDROCHLOROTHIAZIDE 25 MG TABS (HYDROCHLOROTHIAZIDE) Take 1 tablet by mouth every morning OMEPRAZOLE 20 MG CPDR (OMEPRAZOLE) Take 1 capsule by mouth once a day VYTORIN 10-40 MG TABS (EZETIMIBE-SIMVASTATIN) Take 1 tablet by mouth once a day GNP ALLERGY RELIEF 10 MG  TBDP (LORATADINE) ???? WARFARIN SODIUM 5 MG TABS (WARFARIN SODIUM) once daily or as directed DILT-XR 240 MG  CP24 (DILTIAZEM HCL) 1 capsule once daily [facility] DAILY MULTIPLE   TABS (MULTIPLE VITAMIN) 1 once daily DIGOXIN 0.125 MG TABS (DIGOXIN) once daily   Anticoagulation Visit Questionnaire      Coumadin dose missed/changed:  No      Abnormal Bleeding Symptoms:  No   Any diet changes including alcohol intake, vegetables or greens since the last visit:  No Any illnesses or hospitalizations since the  last visit:  No Any signs of clotting since the last visit (including chest discomfort, dizziness, shortness of breath, arm tingling, slurred speech, swelling or redness in leg):  No   Laboratory Results   Blood Tests     PT: 17.8 s   (Normal Range: 10.6-13.4)  INR: 2.1   (Normal Range: 0.88-1.12   Therap INR: 2.0-3.5) Comments: ...................................................................Milica Zimonjic  October 18, 2006 4:57  PM       Vital Signs:  Patient Profile:   77 Years Old Female Weight:      152 pounds Temp:     97.8 degrees F oral Pulse rate:   105 / minute Pulse rhythm:   irregular BP sitting:   140 / 72

## 2010-02-09 NOTE — Procedures (Signed)
Summary: colonoscopy  colonoscopy   Imported By: Kassie Mends 03/21/2007 11:00:04  _____________________________________________________________________  External Attachment:    Type:   Image     Comment:   colonoscopy

## 2010-02-09 NOTE — Letter (Signed)
Summary: triage note  triage note   Imported By: Kassie Mends 04/15/2007 08:23:01  _____________________________________________________________________  External Attachment:    Type:   Image     Comment:   triage note

## 2010-02-09 NOTE — Assessment & Plan Note (Signed)
Summary: 1 month rov/pt/njr   Vital Signs:  Patient Profile:   77 Years Old Female Weight:      152 pounds Temp:     98.2 degrees F oral BP sitting:   158 / 78  Vitals Entered By: Lynann Beaver CMA (December 30, 2006 2:25 PM)                 History of Present Illness:  TRICUSPID REGURGITATION (ICD-397.0)-by echo---no SOB, no edema HYPERGLYCEMIA (ICD-790.29)--will need follow up ATRIAL FIBRILLATION (ICD-427.31)-no palpitations, SOB, neurologic dficit  HYPERTENSION (ICD-401.9)-no headache no neurologic deficiti HYPERLIPIDEMIA (ICD-272.4)---tolerating meds without dificulty GERD (ICD-530.81)-great control on prevacid ANXIETY (ICD-300.00)---no sxs recently    Current Allergies: ! ACETYL SALICYLIC ACID (ASPIRIN)  Past Medical History:    Reviewed history from 10/15/2006 and no changes required:       Anxiety       GERD-there was a qustion of Barrett's-not documented on Bx       Hyperlipidemia       Hypertension       Mohs procedure.        Atrial fibrillation  Past Surgical History:    Reviewed history from 09/23/2006 and no changes required:       bcc-face       Hysterectomy   Family History:    Reviewed history from 10/15/2006 and no changes required:       Family History of Colon CA 1st degree relative <60       Family History Breast cancer 1st degree relative <50       Family History Hypertension  Social History:    Reviewed history from 09/23/2006 and no changes required:       Retired       Regular exercise-no       Married    Review of Systems       no other complaints in a complete ROS    Physical Exam  General:     Well-developed,well-nourished,in no acute distress; alert,appropriate and cooperative throughout examination Head:     Normocephalic and atraumatic without obvious abnormalities. No apparent alopecia or balding. Eyes:     vision grossly intact and pupils equal.   Ears:     R ear normal, L ear normal, and no external  deformities.   Nose:     no external deformity.   Neck:     No deformities, masses, or tenderness noted. Chest Wall:     No deformities, masses, or tenderness noted. Lungs:     Normal respiratory effort, chest expands symmetrically. Lungs are clear to auscultation, no crackles or wheezes. Heart:     no murmur, no gallop, no rub, no JVD, no HJR, and irregular rhythm.   Abdomen:     Bowel sounds positive,abdomen soft and non-tender without masses, organomegaly or hernias noted. Msk:     No deformity or scoliosis noted of thoracic or lumbar spine.   Pulses:     R and L carotid,radial,femoral,dorsalis pedis and posterior tibial pulses are full and equal bilaterally Extremities:     No clubbing, cyanosis, edema, or deformity noted  Neurologic:     No cranial nerve deficits noted. Station and gait are normal.  Sensory, motor and coordinative functions appear intact. Skin:     Intact without suspicious lesions or rashes Psych:     Cognition and judgment appear intact. Alert and cooperative with normal attention span and concentration. No apparent delusions, illusions, hallucinations    Impression & Recommendations:  Problem # 1:  ATRIAL FIBRILLATION (ICD-427.31) Well controlled reviewed CV note she understands need for monthly protime Her updated medication list for this problem includes:    Warfarin Sodium 5 Mg Tabs (Warfarin sodium) .Marland Kitchen... 1/2 6 x weekly    Metoprolol Tartrate 50 Mg Tabs (Metoprolol tartrate) .Marland Kitchen... 1 by mouth two times a day    Digoxin 0.125 Mg Tabs (Digoxin) ..... Once daily  Reviewed the following: PT: 17.0 (11/29/2006)   INR: 1.9 (11/29/2006) Next Protime: 64month (dated on 11/29/2006)  Orders: Fingerstick (04540) Protime ([id number])   Problem # 2:  HYPERGLYCEMIA (ICD-790.29) noted A1C check labs in 3 months.  Labs Reviewed: HgBA1c: 6.1 (10/15/2006)   Creat: 1.1 (10/15/2006)      Problem # 3:  HYPERTENSION (ICD-401.9) continue current medications.    Her updated medication list for this problem includes:    Hydrochlorothiazide 25 Mg Tabs (Hydrochlorothiazide) .Marland Kitchen... Take 1 tablet by mouth every morning    Metoprolol Tartrate 50 Mg Tabs (Metoprolol tartrate) .Marland Kitchen... 1 by mouth two times a day  BP today: 158/78 repeat 122/80 Prior BP: 122/78 (11/29/2006)  Prior 10 Yr Risk Heart Disease: Not enough information (10/15/2006)  Labs Reviewed: Creat: 1.1 (10/15/2006) Chol: 215 (10/15/2006)   HDL: 45.5 (10/15/2006)   LDL: DEL (10/15/2006)   TG: 343 (10/15/2006)   Problem # 4:  TRICUSPID REGURGITATION (ICD-397.0) no sxs---no further evaluation Her updated medication list for this problem includes:    Warfarin Sodium 5 Mg Tabs (Warfarin sodium) .Marland Kitchen... 1/2 6 x weekly    Metoprolol Tartrate 50 Mg Tabs (Metoprolol tartrate) .Marland Kitchen... 1 by mouth two times a day   Problem # 5:  ANXIETY (ICD-300.00) well controlled Her updated medication list for this problem includes:    Alprazolam 0.5 Mg Tabs (Alprazolam) .Marland Kitchen... Take 1 tablet by mouth once a day   Complete Medication List: 1)  Alprazolam 0.5 Mg Tabs (Alprazolam) .... Take 1 tablet by mouth once a day 2)  Hydrochlorothiazide 25 Mg Tabs (Hydrochlorothiazide) .... Take 1 tablet by mouth every morning 3)  Omeprazole 20 Mg Cpdr (Omeprazole) .... Take 1 capsule by mouth once a day 4)  Vytorin 10-40 Mg Tabs (Ezetimibe-simvastatin) .... Take 1 tablet by mouth once a day 5)  Gnp Allergy Relief 10 Mg Tbdp (Loratadine) .... ???? 6)  Warfarin Sodium 5 Mg Tabs (Warfarin sodium) .... 1/2 6 x weekly 7)  Metoprolol Tartrate 50 Mg Tabs (Metoprolol tartrate) .Marland Kitchen.. 1 by mouth two times a day 8)  [facility]vs Daily Multiple Tabs (Multiple vitamin) .Marland Kitchen.. 1 once daily 9)  Digoxin 0.125 Mg Tabs (Digoxin) .... Once daily   Patient Instructions: 1)  Please schedule a follow-up appointment in 4 months. 2)  BMP prior to visit, ICD-9: 3)  Hepatic Panel prior to visit, ICD-9: 4)  Lipid Panel prior to visit, ICD-9: 5)  HbgA1C  prior to visit, ICD-9:  6)  MAKE SURE YOU GET YOUR PROTIME (coumadin dose) checked monthly.     ]  ANTICOAGULATION RECORD PREVIOUS REGIMEN & LAB RESULTS Anticoagulation Diagnosis:  Atrial fibrillation on  10/18/2006 Previous INR Goal Range:  2.0-3.0 on  10/18/2006 Previous INR:  1.9 on  11/29/2006 Previous Coumadin Dose(mg):  2.5x5days,none-mon,th on  11/29/2006 Previous Regimen:  2.5x6days/none -thu on  11/29/2006  NEW REGIMEN & LAB RESULTS Current INR: 4.0 Regimen: 2.5mg  x 5 days none on mon. & thu  MEDICATIONS ALPRAZOLAM 0.5 MG TABS (ALPRAZOLAM) Take 1 tablet by mouth once a day HYDROCHLOROTHIAZIDE 25 MG TABS (HYDROCHLOROTHIAZIDE) Take 1 tablet by mouth  every morning OMEPRAZOLE 20 MG CPDR (OMEPRAZOLE) Take 1 capsule by mouth once a day VYTORIN 10-40 MG TABS (EZETIMIBE-SIMVASTATIN) Take 1 tablet by mouth once a day GNP ALLERGY RELIEF 10 MG  TBDP (LORATADINE) ???? WARFARIN SODIUM 5 MG TABS (WARFARIN SODIUM) 1/2 6 x weekly METOPROLOL TARTRATE 50 MG TABS (METOPROLOL TARTRATE) 1 by mouth two times a day [facility] DAILY MULTIPLE   TABS (MULTIPLE VITAMIN) 1 once daily DIGOXIN 0.125 MG TABS (DIGOXIN) once daily   Anticoagulation Visit Questionnaire      Coumadin dose missed/changed:  No      Abnormal Bleeding Symptoms:  No   Any diet changes including alcohol intake, vegetables or greens since the last visit:  No Any illnesses or hospitalizations since the last visit:  No Any signs of clotting since the last visit (including chest discomfort, dizziness, shortness of breath, arm tingling, slurred speech, swelling or redness in leg):  No    Vital Signs:  Patient Profile:   77 Years Old Female Weight:      152 pounds Temp:     98.2 degrees F oral BP sitting:   158 / 78               Laboratory Results   Blood Tests    Date/Time Reported: December 30, 2006 3:40 PM   PT: 24.4 s   (Normal Range: 10.6-13.4)  INR: 4.0   (Normal Range: 0.88-1.12   Therap INR:  2.0-3.5) Comments: ..................................................................Marland KitchenWynona Canes, CMA  December 30, 2006 3:40 PM

## 2010-02-09 NOTE — Assessment & Plan Note (Signed)
Summary: counmadin check/db   Nurse Visit   Vital Signs:  Patient Profile:   77 Years Old Female Pulse rate:   113 / minute BP sitting:   147 / 81  (left arm)                 Prior Medications: ALPRAZOLAM 0.5 MG TABS (ALPRAZOLAM) Take 1 tablet by mouth once a day HYDROCHLOROTHIAZIDE 25 MG TABS (HYDROCHLOROTHIAZIDE) Take 1 tablet by mouth every morning OMEPRAZOLE 20 MG CPDR (OMEPRAZOLE) Take 1 capsule by mouth once a day VYTORIN 10-40 MG TABS (EZETIMIBE-SIMVASTATIN) Take 1 tablet by mouth once a day GNP ALLERGY RELIEF 10 MG  TBDP (LORATADINE) once daily WARFARIN SODIUM 5 MG TABS (WARFARIN SODIUM) once daily as directed [facility] DAILY MULTIPLE   TABS (MULTIPLE VITAMIN) 1 once daily DIGOXIN 0.125 MG  TABS (DIGOXIN) Take 1 tablet by mouth once a day SUSTANE EYE DROPS () qd LISINOPRIL 20 MG TABS (LISINOPRIL) 1 tablet by mouth daily Current Allergies: ! ACETYL SALICYLIC ACID (ASPIRIN) Laboratory Results   Blood Tests    Date/Time Reported: July 23, 2007 1:57 PM   PT: 21.2 s   (Normal Range: 10.6-13.4)  INR: 3.1   (Normal Range: 0.88-1.12   Therap INR: 2.0-3.5) Comments: [name], CMA  July 23, 2007 1:57 PM       Orders Added: 1)  Est. Patient Level I [99211] 2)  Protime [[id number]] 3)  Fingerstick Xanthus.Diener    ]  ANTICOAGULATION RECORD PREVIOUS REGIMEN & LAB RESULTS Anticoagulation Diagnosis:  Atrial fibrillation on  10/18/2006 Previous INR Goal Range:  2.0-3.0 on  10/18/2006 Previous INR:  2.4 on  06/18/2007 Previous Coumadin Dose(mg):  2.5X5DAYS/NONE  ON TU,TH on  06/18/2007 Previous Regimen:  SAME on  06/18/2007 Previous Coagulation Comments:  OV on  04/01/2007  NEW REGIMEN & LAB RESULTS Current INR: 3.1 Regimen: SAME  (no change)  MEDICATIONS ALPRAZOLAM 0.5 MG TABS (ALPRAZOLAM) Take 1 tablet by mouth once a day HYDROCHLOROTHIAZIDE 25 MG TABS (HYDROCHLOROTHIAZIDE) Take 1 tablet by mouth every morning OMEPRAZOLE 20 MG CPDR (OMEPRAZOLE) Take 1 capsule  by mouth once a day VYTORIN 10-40 MG TABS (EZETIMIBE-SIMVASTATIN) Take 1 tablet by mouth once a day GNP ALLERGY RELIEF 10 MG  TBDP (LORATADINE) once daily WARFARIN SODIUM 5 MG TABS (WARFARIN SODIUM) once daily as directed [facility] DAILY MULTIPLE   TABS (MULTIPLE VITAMIN) 1 once daily DIGOXIN 0.125 MG  TABS (DIGOXIN) Take 1 tablet by mouth once a day * SUSTANE EYE DROPS qd LISINOPRIL 20 MG TABS (LISINOPRIL) 1 tablet by mouth daily   Anticoagulation Visit Questionnaire      Coumadin dose missed/changed:  No      Abnormal Bleeding Symptoms:  No   Any diet changes including alcohol intake, vegetables or greens since the last visit:  No Any illnesses or hospitalizations since the last visit:  No Any signs of clotting since the last visit (including chest discomfort, dizziness, shortness of breath, arm tingling, slurred speech, swelling or redness in leg):  No   Laboratory Results   Blood Tests     PT: 21.2 s   (Normal Range: 10.6-13.4)  INR: 3.1   (Normal Range: 0.88-1.12   Therap INR: 2.0-3.5) Comments: [name], CMA  July 23, 2007 1:57 PM       Vital Signs:  Patient Profile:   77 Years Old Female Pulse rate:   113 / minute BP sitting:   147 / 81

## 2010-02-09 NOTE — Progress Notes (Signed)
  Phone Note Call from Patient   Caller: Patient Call For: [name] Summary of Call: Pt's son is asking for treatment for his mother for gout ASAP.  Is in lots of pain in her foot.  Has Indocin available at home to take if Dr. Cato Mulligan would ok this.  They are very ANXIOUS to get RX.  Pt needs to know when to come back for PT and INR. [phone] Initial call taken by: Lynann Beaver CMA AAMA,  December 08, 2009 12:43 PM  Follow-up for Phone Call        prednisone 10 mg  2 by mouth once daily for 2 days and then 1 by mouth once daily for 7 days start allopurinol 300 mg by mouth once daily #30/3 Follow-up by: [name],  December 08, 2009 3:22 PM    New/Updated Medications: PREDNISONE 10 MG TABS (PREDNISONE) 2 daily one daily by mouth x 2 days, then 1 daily by mouth 7 days and stop Prescriptions: PREDNISONE 10 MG TABS (PREDNISONE) 2 daily one daily by mouth x 2 days, then 1 daily by mouth 7 days and stop  #11 x 0   Entered by:   Lynann Beaver CMA AAMA   Authorized by:   [name]   Signed by:   Lynann Beaver CMA AAMA on 12/08/2009   Method used:   Electronically to        [facility]  [phone]* (retail)       [address] [place], [place]  [address]       Ph: [phone] or [phone]       Fax: [phone]   RxID:   [id number]  Pt notified and already has Allopurinol at home.

## 2010-02-09 NOTE — Assessment & Plan Note (Signed)
Summary: bp is high needed to see dr asap--smm   Vital Signs:  Patient Profile:   77 Years Old Female Weight:      154 pounds Temp:     98.2 degrees F oral Pulse rate:   86 / minute Pulse rhythm:   regular Resp:     16 per minute BP sitting:   160 / 78  Vitals Entered By: Lynann Beaver CMA (September 23, 2006 4:30 PM)                 Serial Vital Signs/Assessments:  Time      Position  BP       Pulse  Resp  Temp     By                     135/84                         [name]   Chief Complaint:  Pt concerned about elevated pulse rates and blood pressures..  History of Present Illness:  Follow-Up Visit: concerned with elevated pulse she says her BP machine measured 90-105. She felt well except one time she felt dizzy.       This is a 77 year old woman who presents for Follow-up visit.  The patient denies chest pain, palpitations, dizziness, syncope, low blood sugar symptoms, high blood sugar symptoms, edema, SOB, DOE, PND, and orthopnea.  Since the last visit the patient notes no new problems or concerns.  The patient reports taking meds as prescribed.  When questioned about possible medication side effects, the patient notes none.    Current Allergies: ! ACETYL SALICYLIC ACID (ASPIRIN)  Past Medical History:    Anxiety    GERD-there was a qustion of Barrett's-not documented on Bx    Hyperlipidemia    Hypertension  Past Surgical History:    bcc-face    Hysterectomy   Social History:    Retired    Regular exercise-no    Married   Risk Factors:  Exercise:  no   Review of Systems       no other complaints   Physical Exam  General:     Well-developed,well-nourished,in no acute distress; alert,appropriate and cooperative throughout examination Head:     atraumatic.   Eyes:     pupils equal and pupils round.   Ears:     no external deformities.   Neck:     No deformities, masses, or tenderness noted. Chest Wall:     No deformities,  masses, or tenderness noted. Lungs:     Normal respiratory effort, chest expands symmetrically. Lungs are clear to auscultation, no crackles or wheezes. Abdomen:     Bowel sounds positive,abdomen soft and non-tender without masses, organomegaly or hernias noted.    Impression & Recommendations:  Problem # 1:  HYPERTENSION (ICD-401.9) variable by her report. I suspect her fast heart rate is physicologic and related to relative hypotension. Her sxs have resolved.  Her updated medication list for this problem includes:    Hydrochlorothiazide 25 Mg Tabs (Hydrochlorothiazide) .Marland Kitchen... Take 1 tablet by mouth every morning    Lotrel 5-10 Mg Caps (Amlodipine besy-benazepril hcl) .Marland Kitchen... Take 1 capsule by mouth once a day   Complete Medication List: 1)  Alprazolam 0.5 Mg Tabs (Alprazolam) .... Take 1 tablet by mouth once a day 2)  Hydrochlorothiazide 25 Mg Tabs (Hydrochlorothiazide) .... Take 1 tablet by mouth every morning  3)  Lotrel 5-10 Mg Caps (Amlodipine besy-benazepril hcl) .... Take 1 capsule by mouth once a day 4)  Omeprazole 20 Mg Cpdr (Omeprazole) .... Take 1 capsule by mouth once a day 5)  Vytorin 10-40 Mg Tabs (Ezetimibe-simvastatin) .... Take 1 tablet by mouth once a day 6)  Gnp Allergy Relief 10 Mg Tbdp (Loratadine) .... ????     ]  Pneumovax Immunization History:    Pneumovax # 1:  Historical (10/09/2002)

## 2010-02-09 NOTE — Assessment & Plan Note (Signed)
Summary: 6 month rov/njr   Vital Signs:  Patient profile:   77 year old female Height:      64 inches Weight:      135 pounds Temp:     98.4 degrees F oral Pulse rate:   72 / minute Pulse rhythm:   irregular BP sitting:   130 / 80  History of Present Illness:  Follow-Up Visit      This is an 77 year old woman who presents for Follow-up visit.  The patient denies chest pain and palpitations.  Since the last visit the patient notes no new problems or concerns.  The patient reports taking meds as prescribed.  When questioned about possible medication side effects, the patient notes none.    she has no complaints, remains very active  All other systems reviewed and were negative   Current Problems (verified): 1)  Encounter For Therapeutic Drug Monitoring  (ICD-V58.83) 2)  Gout  (ICD-274.9) 3)  Coumadin Therapy  (ICD-V58.61) 4)  Tricuspid Regurgitation  (ICD-397.0) 5)  Hyperglycemia  (ICD-790.29) 6)  Atrial Fibrillation  (ICD-427.31) 7)  Hypertension  (ICD-401.9) 8)  Hyperlipidemia  (ICD-272.4) 9)  Gerd  (ICD-530.81) 10)  Anxiety  (ICD-300.00)  Current Medications (verified): 1)  Alprazolam 0.5 Mg Tabs (Alprazolam) .... Take 1 Tablet By Mouth Once A Day 2)  Omeprazole 20 Mg Cpdr (Omeprazole) .... Take 1 Capsule By Mouth Once A Day 3)  Gnp Allergy Relief 10 Mg  Tbdp (Loratadine) .... Once Daily 4)  Warfarin Sodium 5 Mg Tabs (Warfarin Sodium) .... Once Daily As Directed 5)  Cvs Daily Multiple   Tabs (Multiple Vitamin) .Marland Kitchen.. 1 Once Daily 6)  Digoxin 0.125 Mg  Tabs (Digoxin) .... Take 1 Tablet By Mouth Once A Day 7)  Lisinopril 20 Mg Tabs (Lisinopril) .Marland Kitchen.. 1 Tablet By Mouth Daily 8)  Crestor 20 Mg Tabs (Rosuvastatin Calcium) .... Take 1 Tablet By Mouth At Bedtime 9)  Meloxicam 7.5 Mg  Tabs (Meloxicam) .... One By Mouth Daily As Needed 10)  Optive 0.5-0.9 % Soln (Carboxymethylcellul-Glycerin)  Allergies (verified): 1)  ! Acetyl Salicylic Acid (Aspirin)  Past History:  Past  Medical History: Last updated: 02/18/2007 Anxiety GERD-there was a qustion of Barrett's-not documented on Bx Hyperlipidemia Hypertension Mohs procedure.  Atrial fibrillation Colon Polyps  Past Surgical History: Last updated: 04/04/2009 bcc-face Hysterectomy Cataract extraction--left eye  Family History: Last updated: 10/15/2006 Family History of Colon CA 1st degree relative <60 Family History Breast cancer 1st degree relative <50 Family History Hypertension  Social History: Last updated: 09/23/2006 Retired Regular exercise-no Married  Risk Factors: Exercise: no (08/19/2009)  Risk Factors: Smoking Status: never (08/19/2009)  Physical Exam  General:  alert and well-developed.   Head:  normocephalic and atraumatic.   Eyes:  pupils equal and pupils round.   Ears:  R ear normal and L ear normal.   Neck:  cervical spine tenderness Chest Wall:  No deformities, masses, or tenderness noted. Lungs:  normal respiratory effort and no intercostal retractions.   Heart:  normal rate and irregular rhythm.   Abdomen:  soft.   Msk:  No deformity or scoliosis noted of thoracic or lumbar spine.   bunion right foot Skin:  turgor normal and color normal.     Impression & Recommendations:  Problem # 1:  GOUT (ICD-274.9) no recurrence  Problem # 2:  ATRIAL FIBRILLATION (ICD-427.31) rate controlled continue current medications  Her updated medication list for this problem includes:    Warfarin Sodium 5 Mg Tabs (  Warfarin sodium) ..... Once daily as directed    Digoxin 0.125 Mg Tabs (Digoxin) .Marland Kitchen... Take 1 tablet by mouth once a day  Problem # 3:  HYPERTENSION (ICD-401.9) controlled continue current medications  Her updated medication list for this problem includes:    Lisinopril 20 Mg Tabs (Lisinopril) .Marland Kitchen... 1 tablet by mouth daily  BP today: 130/80 Prior BP: 110/80 (08/19/2009)  Prior 10 Yr Risk Heart Disease: Not enough information (10/15/2006)  Labs Reviewed: K+: 4.0  (08/19/2009) Creat: : 1.0 (08/19/2009)   Chol: 241 (07/06/2009)   HDL: 43.30 (07/06/2009)   LDL: DEL (09/22/2007)   TG: 781.0 (07/06/2009)  Problem # 4:  HYPERLIPIDEMIA (ICD-272.4) previously controlled Her updated medication list for this problem includes:    Crestor 20 Mg Tabs (Rosuvastatin calcium) .Marland Kitchen... Take 1 tablet by mouth at bedtime  Labs Reviewed: SGOT: 36 (07/06/2009)   SGPT: 29 (07/06/2009)  Lipid Goals: Chol Goal: 200 (10/15/2006)   HDL Goal: 40 (10/15/2006)   LDL Goal: 130 (10/15/2006)   TG Goal: 150 (10/15/2006)  Prior 10 Yr Risk Heart Disease: Not enough information (10/15/2006)   HDL:43.30 (07/06/2009), 46.30 (05/19/2009)  LDL:DEL (09/22/2007), DEL (04/01/2007)  Chol:241 (07/06/2009), 305 (05/19/2009)  Trig:781.0 (07/06/2009), 1218.0 (05/19/2009)  Complete Medication List: 1)  Alprazolam 0.5 Mg Tabs (Alprazolam) .... Take 1 tablet by mouth once a day 2)  Omeprazole 20 Mg Cpdr (Omeprazole) .... Take 1 capsule by mouth once a day 3)  Gnp Allergy Relief 10 Mg Tbdp (Loratadine) .... Once daily 4)  Warfarin Sodium 5 Mg Tabs (Warfarin sodium) .... Once daily as directed 5)  Cvs Daily Multiple Tabs (Multiple vitamin) .Marland Kitchen.. 1 once daily 6)  Digoxin 0.125 Mg Tabs (Digoxin) .... Take 1 tablet by mouth once a day 7)  Lisinopril 20 Mg Tabs (Lisinopril) .Marland Kitchen.. 1 tablet by mouth daily 8)  Crestor 20 Mg Tabs (Rosuvastatin calcium) .... Take 1 tablet by mouth at bedtime 9)  Meloxicam 7.5 Mg Tabs (Meloxicam) .... One by mouth daily as needed 10)  Optive 0.5-0.9 % Soln (Carboxymethylcellul-glycerin)  Other Orders: Flu Vaccine 36yrs + MEDICARE PATIENTS (S0630) Administration Flu vaccine - MCR (Z6010)  Patient Instructions: 1)  6 months         Flu Vaccine Consent Questions     Do you have a history of severe allergic reactions to this vaccine? no    Any prior history of allergic reactions to egg and/or gelatin? no    Do you have a sensitivity to the preservative Thimersol? no     Do you have a past history of Guillan-Barre Syndrome? no    Do you currently have an acute febrile illness? no    Have you ever had a severe reaction to latex? no    Vaccine information given and explained to patient? yes    Are you currently pregnant? no    Lot Number:[id number]   Exp Date:07/08/2010   Site Given  Left Deltoid IMdflu

## 2010-02-09 NOTE — Assessment & Plan Note (Signed)
Summary: protime/ccm   Nurse Visit   Vital Signs:  Patient Profile:   77 Years Old Female Pulse rate:   86 / minute BP sitting:   145 / 73  (left arm)                 Prior Medications: ALPRAZOLAM 0.5 MG TABS (ALPRAZOLAM) Take 1 tablet by mouth once a day HYDROCHLOROTHIAZIDE 25 MG TABS (HYDROCHLOROTHIAZIDE) Take 1 tablet by mouth every morning OMEPRAZOLE 20 MG CPDR (OMEPRAZOLE) Take 1 capsule by mouth once a day VYTORIN 10-40 MG TABS (EZETIMIBE-SIMVASTATIN) Take 1 tablet by mouth once a day GNP ALLERGY RELIEF 10 MG  TBDP (LORATADINE) once daily WARFARIN SODIUM 5 MG TABS (WARFARIN SODIUM) once daily as directed [facility] DAILY MULTIPLE   TABS (MULTIPLE VITAMIN) 1 once daily DIGOXIN 0.125 MG  TABS (DIGOXIN) Take 1 tablet by mouth once a day SYSTANE NIGHTTIME  OINT (WHITE PETROLATUM-MINERAL OIL) once daily as directed LISINOPRIL 20 MG TABS (LISINOPRIL) 1 tablet by mouth daily Current Allergies: ! ACETYL SALICYLIC ACID (ASPIRIN) Laboratory Results   Blood Tests     PT: 16.1 s   (Normal Range: 10.6-13.4)  INR: 1.7   (Normal Range: 0.88-1.12   Therap INR: 2.0-3.5) Comments: [name]  November 25, 2007 2:26 PM       Orders Added: 1)  Est. Patient Level I [99211] 2)  Fingerstick [36416] 3)  Protime Ila.Stager    ]  Vital Signs:  Patient Profile:   77 Years Old Female Pulse rate:   86 / minute BP sitting:   145 / 73                   ANTICOAGULATION RECORD PREVIOUS REGIMEN & LAB RESULTS Anticoagulation Diagnosis:  Atrial fibrillation on  10/18/2006 Previous INR Goal Range:  2.0-3.0 on  10/18/2006 Previous INR:  3.7 on  11/11/2007 Previous Coumadin Dose(mg):  2.5X5DAYS/NONE  ON TU,TH on  06/18/2007 Previous Regimen:  2.5mg  qd none tue,thu,sat. on  11/11/2007 Previous Coagulation Comments:  Approved by Dr. Lovell Sheehan. on  10/22/2007  NEW REGIMEN & LAB RESULTS Current INR: 1.7 Regimen: none Tues. & Thurs. 2.5mg  other days  Repeat testing in: 4  weeks  Anticoagulation Visit Questionnaire Coumadin dose missed/changed:  No Abnormal Bleeding Symptoms:  No  Any diet changes including alcohol intake, vegetables or greens since the last visit:  No Any illnesses or hospitalizations since the last visit:  No Any signs of clotting since the last visit (including chest discomfort, dizziness, shortness of breath, arm tingling, slurred speech, swelling or redness in leg):  No  MEDICATIONS ALPRAZOLAM 0.5 MG TABS (ALPRAZOLAM) Take 1 tablet by mouth once a day HYDROCHLOROTHIAZIDE 25 MG TABS (HYDROCHLOROTHIAZIDE) Take 1 tablet by mouth every morning OMEPRAZOLE 20 MG CPDR (OMEPRAZOLE) Take 1 capsule by mouth once a day VYTORIN 10-40 MG TABS (EZETIMIBE-SIMVASTATIN) Take 1 tablet by mouth once a day GNP ALLERGY RELIEF 10 MG  TBDP (LORATADINE) once daily WARFARIN SODIUM 5 MG TABS (WARFARIN SODIUM) once daily as directed [facility]S DAILY MULTIPLE   TABS (MULTIPLE VITAMIN) 1 once daily DIGOXIN 0.125 MG  TABS (DIGOXIN) Take 1 tablet by mouth once a day SYSTANE NIGHTTIME  OINT (WHITE PETROLATUM-MINERAL OIL) once daily as directed LISINOPRIL 20 MG TABS (LISINOPRIL) 1 tablet by mouth daily

## 2010-02-09 NOTE — Progress Notes (Signed)
Summary: alprazolam refill  Phone Note Refill Request Message from:  Fax from Pharmacy on July 21, 2009 4:54 PM  Refills Requested: Medication #1:  ALPRAZOLAM 0.5 MG TABS Take 1 tablet by mouth once a day Initial call taken by: Kern Reap CMA Duncan Dull),  July 21, 2009 4:54 PM    Prescriptions: ALPRAZOLAM 0.5 MG TABS (ALPRAZOLAM) Take 1 tablet by mouth once a day  #30 x 3   Entered by:   Kern Reap CMA (AAMA)   Authorized by:   [name]   Signed by:   Kern Reap CMA (AAMA) on 07/21/2009   Method used:   Telephoned to ...       [facility]  [phone]* (retail)       [address] [place], [place]  [address]       Ph: [phone] or [phone]       Fax: [phone]   RxID:   [id number]

## 2010-02-09 NOTE — Assessment & Plan Note (Signed)
Summary: pt/Deanna [name] pt rsc/Deanna [name]   Nurse Visit   Allergies: 1)  ! Acetyl Salicylic Acid (Aspirin) Laboratory Results   Blood Tests   Date/Time Received: February 28, 2009 2:30 PM  Date/Time Reported: February 28, 2009 2:30 PM   PT: 18.2 s   (Normal Range: 10.6-13.4)  INR: 2.2   (Normal Range: 0.88-1.12   Therap INR: 2.0-3.5) Comments: [name], CMA  February 28, 2009 2:30 PM     Orders Added: 1)  Est. Patient Level I [99211] 2)  Protime [63875IE]  Laboratory Results   Blood Tests     PT: 18.2 s   (Normal Range: 10.6-13.4)  INR: 2.2   (Normal Range: 0.88-1.12   Therap INR: 2.0-3.5) Comments: [name], CMA  February 28, 2009 2:30 PM       ANTICOAGULATION RECORD PREVIOUS REGIMEN & LAB RESULTS Anticoagulation Diagnosis:  Atrial fibrillation on  10/18/2006 Previous INR Goal Range:  2.0-3.0 on  10/18/2006 Previous INR:  3.5 on  01/26/2009 Previous Coumadin Dose(mg):  none Mon. & Thurs others 2.5mg  on  06/21/2008 Previous Regimen:  hold 1 day then resume on  01/26/2009 Previous Coagulation Comments:  Dr Lovell Sheehan approved on  01/26/2009  NEW REGIMEN & LAB RESULTS Current INR: 2.2 Current Coumadin Dose(mg): 2.5mg  qd Regimen: 2.5mg  qd       Repeat testing in: 4 weeks MEDICATIONS ALPRAZOLAM 0.5 MG TABS (ALPRAZOLAM) Take 1 tablet by mouth once a day HYDROCHLOROTHIAZIDE 25 MG TABS (HYDROCHLOROTHIAZIDE) Take 1 tablet by mouth every morning OMEPRAZOLE 20 MG CPDR (OMEPRAZOLE) Take 1 capsule by mouth once a day GNP ALLERGY RELIEF 10 MG  TBDP (LORATADINE) once daily WARFARIN SODIUM 5 MG TABS (WARFARIN SODIUM) once daily as directed CVS DAILY MULTIPLE   TABS (MULTIPLE VITAMIN) 1 once daily DIGOXIN 0.125 MG  TABS (DIGOXIN) Take 1 tablet by mouth once a day SYSTANE NIGHTTIME  OINT (WHITE PETROLATUM-MINERAL OIL) once daily as directed LISINOPRIL 20 MG TABS (LISINOPRIL) 1 tablet by mouth daily SIMVASTATIN 40 MG TABS (SIMVASTATIN) Take one tablet at  bedtime CELEBREX 200 MG CAPS (CELECOXIB) Take 1 tablet by mouth once a day as needed.  Not to exceed 2 per week   Anticoagulation Visit Questionnaire      Coumadin dose missed/changed:  No      Abnormal Bleeding Symptoms:  No   Any diet changes including alcohol intake, vegetables or greens since the last visit:  No Any illnesses or hospitalizations since the last visit:  No Any signs of clotting since the last visit (including chest discomfort, dizziness, shortness of breath, arm tingling, slurred speech, swelling or redness in leg):  No

## 2010-02-09 NOTE — Assessment & Plan Note (Signed)
Summary: foot pain/dm   Vital Signs:  Patient profile:   77 year old female Weight:      136 pounds Temp:     97.9 degrees F oral Pulse rate:   66 / minute BP sitting:   110 / 80  (right arm) Cuff size:   regular  Vitals Entered By: Romualdo Bolk, CMA Duncan Dull) (August 19, 2009 8:33 AM) CC: Rt Foot Pain on Sun and Thurs to the point of not being able to walk on it.   CC:  Rt Foot Pain on Sun and Thurs to the point of not being able to walk on it.Marland Kitchen  History of Present Illness: 2 episodes of foot pain pain located lateral area of fott has happened twice in 6 days area is tender and she can/t walk---pain is severe. says she has gone to a "foot dr. who said it was arthritis" pain resolved quickly with celebrex or meloxicam (1-2 doses)  All other systems reviewed and were negative   Preventive Screening-Counseling & Management  Alcohol-Tobacco     Smoking Status: never  Caffeine-Diet-Exercise     Does Patient Exercise: no  Current Problems (verified): 1)  Encounter For Therapeutic Drug Monitoring  (ICD-V58.83) 2)  Gout  (ICD-274.9) 3)  Coumadin Therapy  (ICD-V58.61) 4)  Tricuspid Regurgitation  (ICD-397.0) 5)  Hyperglycemia  (ICD-790.29) 6)  Atrial Fibrillation  (ICD-427.31) 7)  Hypertension  (ICD-401.9) 8)  Hyperlipidemia  (ICD-272.4) 9)  Gerd  (ICD-530.81) 10)  Anxiety  (ICD-300.00)  Current Medications (verified): 1)  Alprazolam 0.5 Mg Tabs (Alprazolam) .... Take 1 Tablet By Mouth Once A Day 2)  Hydrochlorothiazide 25 Mg Tabs (Hydrochlorothiazide) .... Take 1 Tablet By Mouth Every Morning 3)  Omeprazole 20 Mg Cpdr (Omeprazole) .... Take 1 Capsule By Mouth Once A Day 4)  Gnp Allergy Relief 10 Mg  Tbdp (Loratadine) .... Once Daily 5)  Warfarin Sodium 5 Mg Tabs (Warfarin Sodium) .... Once Daily As Directed 6)  Cvs Daily Multiple   Tabs (Multiple Vitamin) .Marland Kitchen.. 1 Once Daily 7)  Digoxin 0.125 Mg  Tabs (Digoxin) .... Take 1 Tablet By Mouth Once A Day 8)  Lisinopril 20  Mg Tabs (Lisinopril) .Marland Kitchen.. 1 Tablet By Mouth Daily 9)  Crestor 20 Mg Tabs (Rosuvastatin Calcium) .... Take 1 Tablet By Mouth At Bedtime 10)  Meloxicam 7.5 Mg  Tabs (Meloxicam) .... One By Mouth Daily As Needed 11)  Optive 0.5-0.9 % Soln (Carboxymethylcellul-Glycerin)  Allergies (verified): 1)  ! Acetyl Salicylic Acid (Aspirin)  Past History:  Past Medical History: Last updated: 02/18/2007 Anxiety GERD-there was a qustion of Barrett's-not documented on Bx Hyperlipidemia Hypertension Mohs procedure.  Atrial fibrillation Colon Polyps  Past Surgical History: Last updated: 04/04/2009 bcc-face Hysterectomy Cataract extraction--left eye  Family History: Last updated: 10/15/2006 Family History of Colon CA 1st degree relative <60 Family History Breast cancer 1st degree relative <50 Family History Hypertension  Social History: Last updated: 09/23/2006 Retired Regular exercise-no Married  Risk Factors: Exercise: no (08/19/2009)  Risk Factors: Smoking Status: never (08/19/2009)  Physical Exam  General:  alert and well-developed.   Head:  normocephalic and atraumatic.   Eyes:  pupils equal and pupils round.   Ears:  R ear normal and L ear normal.   Neck:  cervical spine tenderness Lungs:  normal respiratory effort and no intercostal retractions.   Heart:  normal rate and regular rhythm.   Abdomen:  Bowel sounds positive,abdomen soft and non-tender without masses, organomegaly or hernias noted. Msk:  No deformity or  scoliosis noted of thoracic or lumbar spine.   bunion right foot Neurologic:  cranial nerves II-XII intact and gait normal.     Impression & Recommendations:  Problem # 1:  GOUT (ICD-274.9)  i suspect this is the cause of recurent pain note on HCTZ will stop check uric acid  Orders: Venipuncture (16109) TLB-BMP (Basic Metabolic Panel-BMET) (80048-METABOL) TLB-Uric Acid, Blood (84550-URIC)  Problem # 2:  HYPERTENSION (ICD-401.9)  stop hctz  (gout) The following medications were removed from the medication list:    Hydrochlorothiazide 25 Mg Tabs (Hydrochlorothiazide) .Marland Kitchen... Take 1 tablet by mouth every morning Her updated medication list for this problem includes:    Lisinopril 20 Mg Tabs (Lisinopril) .Marland Kitchen... 1 tablet by mouth daily  BP today: 110/80 Prior BP: 134/70 (07/13/2009)  Prior 10 Yr Risk Heart Disease: Not enough information (10/15/2006)  Labs Reviewed: K+: 4.1 (04/04/2009) Creat: : 1.0 (04/04/2009)   Chol: 241 (07/06/2009)   HDL: 43.30 (07/06/2009)   LDL: DEL (09/22/2007)   TG: 781.0 (07/06/2009)  Orders: Venipuncture (60454) TLB-BMP (Basic Metabolic Panel-BMET) (80048-METABOL) TLB-Uric Acid, Blood (84550-URIC)  Problem # 3:  COUMADIN THERAPY (ICD-V58.61)  Problem # 4:  ATRIAL FIBRILLATION (ICD-427.31)  Her updated medication list for this problem includes:    Warfarin Sodium 5 Mg Tabs (Warfarin sodium) ..... Once daily as directed    Digoxin 0.125 Mg Tabs (Digoxin) .Marland Kitchen... Take 1 tablet by mouth once a day  Reviewed the following: PT: 21.4 (06/02/2009)   INR: 3.5 (08/04/2009) Coumadin Dose (weekly): N/A (03/03/2007) Prior Coumadin Dose (weekly): N/A (03/03/2007) Next Protime: 2 weeks (dated on 08/04/2009)  Complete Medication List: 1)  Alprazolam 0.5 Mg Tabs (Alprazolam) .... Take 1 tablet by mouth once a day 2)  Omeprazole 20 Mg Cpdr (Omeprazole) .... Take 1 capsule by mouth once a day 3)  Gnp Allergy Relief 10 Mg Tbdp (Loratadine) .... Once daily 4)  Warfarin Sodium 5 Mg Tabs (Warfarin sodium) .... Once daily as directed 5)  Cvs Daily Multiple Tabs (Multiple vitamin) .Marland Kitchen.. 1 once daily 6)  Digoxin 0.125 Mg Tabs (Digoxin) .... Take 1 tablet by mouth once a day 7)  Lisinopril 20 Mg Tabs (Lisinopril) .Marland Kitchen.. 1 tablet by mouth daily 8)  Crestor 20 Mg Tabs (Rosuvastatin calcium) .... Take 1 tablet by mouth at bedtime 9)  Meloxicam 7.5 Mg Tabs (Meloxicam) .... One by mouth daily as needed 10)  Optive 0.5-0.9 %  Soln (Carboxymethylcellul-glycerin)  Patient Instructions: 1)  .  Appended Document: Orders Update     Clinical Lists Changes  Orders: Added new Service order of Protime ([id number]) - Signed Added new Service order of Specimen Handling ([id number]) - Signed Observations: Added new observation of ABNORM BLEED: No (08/19/2009 9:06) Added new observation of COUMADIN CHG: No (08/19/2009 9:06) Added new observation of NEXT PT: 4 weeks (08/19/2009 9:06) Added new observation of COMMENTS2: [name], CMA  August 19, 2009 9:13 AM  (08/19/2009 9:06) Added new observation of INR: 2.5  (08/19/2009 9:06)      Laboratory Results   Blood Tests   Date/Time Recieved: August 19, 2009 9:13 AM  Date/Time Reported: August 19, 2009 9:13 AM    INR: 2.5   (Normal Range: 0.88-1.12   Therap INR: 2.0-3.5) Comments: [name], CMA  August 19, 2009 9:13 AM       ANTICOAGULATION RECORD PREVIOUS REGIMEN & LAB RESULTS Anticoagulation Diagnosis:  Atrial fibrillation on  10/18/2006 Previous INR Goal Range:  2.0-3.0 on  10/18/2006 Previous INR:  3.5 on  08/04/2009 Previous Coumadin Dose(mg):  None only on  thursdays then 2.5mg  qd on  08/04/2009 Previous Regimen:  Same Dose on  06/02/2009 Previous Coagulation Comments:  Patient was asked to recheck her coumadin in 2 weeks but she decided to check it in 4 weeks. on  08/04/2009  NEW REGIMEN & LAB RESULTS Current INR: 2.5 Regimen: Same Dose  (no change)       Repeat testing in: 4 weeks MEDICATIONS ALPRAZOLAM 0.5 MG TABS (ALPRAZOLAM) Take 1 tablet by mouth once a day OMEPRAZOLE 20 MG CPDR (OMEPRAZOLE) Take 1 capsule by mouth once a day GNP ALLERGY RELIEF 10 MG  TBDP (LORATADINE) once daily WARFARIN SODIUM 5 MG TABS (WARFARIN SODIUM) once daily as directed [facility]S DAILY MULTIPLE   TABS (MULTIPLE VITAMIN) 1 once daily DIGOXIN 0.125 MG  TABS (DIGOXIN) Take 1 tablet by mouth once a day LISINOPRIL 20 MG TABS (LISINOPRIL) 1 tablet by mouth  daily CRESTOR 20 MG TABS (ROSUVASTATIN CALCIUM) Take 1 tablet by mouth at bedtime MELOXICAM 7.5 MG  TABS (MELOXICAM) one by mouth daily as needed OPTIVE 0.5-0.9 % SOLN (CARBOXYMETHYLCELLUL-GLYCERIN)    Anticoagulation Visit Questionnaire      Coumadin dose missed/changed:  No      Abnormal Bleeding Symptoms:  No   Any diet changes including alcohol intake, vegetables or greens since the last visit:  No Any illnesses or hospitalizations since the last visit:  No Any signs of clotting since the last visit (including chest discomfort, dizziness, shortness of breath, arm tingling, slurred speech, swelling or redness in leg):  No

## 2010-02-09 NOTE — Progress Notes (Signed)
Summary: PHARMACY REQ RETURN CALL  Phone Note From Pharmacy   Caller: [facility]oon  [phone]* Request: Speak with Provider Summary of Call: Gerda Diss, [facility] called to inquire about a dosage on a RX that was written for pt back in September 2010 that pt has just brought into pharmacy to have filled..... Adv that same was for med Celebrex? - However, there was no dosage amt listed?  Gerda Diss, [facility] Pharmacist can be reached @ [phone].  Initial call taken by: Debbra Riding,  January 31, 2009 9:24 AM  Follow-up for Phone Call        not on pt med list and do not see where this was prescribed.  Pharmacy has hard copy with dose and frequency missing.  they will fax a copy of hard copy. Follow-up by: Gladis Riffle, RN,  February 01, 2009 8:41 AM  Additional Follow-up for Phone Call Additional follow up Details #1::        200mg  per dr Marsella Suman.  call pt to see why she needs it.Gladis Riffle, RN  February 01, 2009 9:13 AM  Left message on machine. Pt to call back. Gladis Riffle, RN  February 01, 2009 9:13 AM     Additional Follow-up for Phone Call Additional follow up Details #2::    uses for arthrits of toe when needed.  Had samples and is just now running out of them.  New/Updated Medications: CELEBREX 200 MG CAPS (CELECOXIB) Take 1 tablet by mouth once a day as needed.  Not to exceed 2 per week Prescriptions: CELEBREX 200 MG CAPS (CELECOXIB) Take 1 tablet by mouth once a day as needed.  Not to exceed 2 per week  #10 x 0   Entered by:   Gladis Riffle, RN   Authorized by:   [name]   Signed by:   Gladis Riffle, RN on 02/01/2009   Method used:   Electronically to        [facility]  [phone]* (retail)       [address] [place], [place]  [address]       Ph: [phone] or [phone]       Fax: [phone]   RxID:   [id number]

## 2010-02-09 NOTE — Assessment & Plan Note (Signed)
Summary: abdominal pain/dm   Vital Signs:  Patient Profile:   77 Years Old Female Weight:      133 pounds Temp:     97.9 degrees F oral Pulse rate:   108 / minute Pulse rhythm:   irregular BP sitting:   150 / 86  (left arm) Cuff size:   regular  Vitals Entered By: Raechel Ache, RN (October 24, 2007 11:04 AM)                 Chief Complaint:  Up all noc with diarrhea and stomachache- also had last Saturday. No N and V..  History of Present Illness: 77 year old female, who expressed an episode of diarrhea 6 days ago.  This was associate with some crampy mid epigastric pain and resolved after several hours.  Last night.  She was again awakened with some crampy bowel pain, associated with profuse diarrhea throughout the night.  His been no further diarrhea or the past several hours and her pain.  Also has resolved.  There's been no nausea or vomiting.  She is accompanied by her daughter, who had the same meal prior to onset of symptoms.  The patient's granddaughter has had gallbladder disease and the patient is concerned about this possibility. She is treated hypertension, and dyslipidemia, which has been stable.  She has been on chronic PPI therapy; remains on Coumadin for chronic atrial fibrillation    Current Allergies: ! ACETYL SALICYLIC ACID (ASPIRIN)  Past Medical History:    Reviewed history from 02/18/2007 and no changes required:       Anxiety       GERD-there was a qustion of Barrett's-not documented on Bx       Hyperlipidemia       Hypertension       Mohs procedure.        Atrial fibrillation       Colon Polyps   Family History:    Reviewed history from 10/15/2006 and no changes required:       Family History of Colon CA 1st degree relative <60       Family History Breast cancer 1st degree relative <50       Family History Hypertension    Review of Systems       The patient complains of abdominal pain.  The patient denies anorexia, fever, weight loss,  weight gain, vision loss, decreased hearing, hoarseness, chest pain, syncope, dyspnea on exertion, peripheral edema, prolonged cough, headaches, hemoptysis, melena, hematochezia, severe indigestion/heartburn, hematuria, incontinence, genital sores, muscle weakness, suspicious skin lesions, transient blindness, difficulty walking, depression, unusual weight change, abnormal bleeding, enlarged lymph nodes, angioedema, and breast masses.     Physical Exam  General:     Well-developed,well-nourished,in no acute distress; alert,appropriate and cooperative throughout examination Head:     Normocephalic and atraumatic without obvious abnormalities. No apparent alopecia or balding. Eyes:     No corneal or conjunctival inflammation noted. EOMI. Perrla. Funduscopic exam benign, without hemorrhages, exudates or papilledema. Vision grossly normal. Mouth:     Oral mucosa and oropharynx without lesions or exudates.  Teeth in good repair. Neck:     No deformities, masses, or tenderness noted. Lungs:     Normal respiratory effort, chest expands symmetrically. Lungs are clear to auscultation, no crackles or wheezes. Heart:     controlled ventricular response Abdomen:     Bowel sounds positive,abdomen soft and non-tender without masses, organomegaly or hernias noted.soft, non-tender, normal bowel sounds, no masses, no guarding, and  no rigidity.      Impression & Recommendations:  Problem # 1:  ATRIAL FIBRILLATION (ICD-427.31)  Her updated medication list for this problem includes:    Warfarin Sodium 5 Mg Tabs (Warfarin sodium) ..... Once daily as directed    Digoxin 0.125 Mg Tabs (Digoxin) .Marland Kitchen... Take 1 tablet by mouth once a day   Problem # 2:  ABDOMINAL PAIN, ACUTE (ICD-789.00)  Problem # 3:  DIARRHEA (ICD-787.91) options were discussed.  The patient presently is symptom free;  her symptoms are not compatible with cholelithiasis.  Will clinically observe at this time unless symptoms  recur  Complete Medication List: 1)  Alprazolam 0.5 Mg Tabs (Alprazolam) .... Take 1 tablet by mouth once a day 2)  Hydrochlorothiazide 25 Mg Tabs (Hydrochlorothiazide) .... Take 1 tablet by mouth every morning 3)  Omeprazole 20 Mg Cpdr (Omeprazole) .... Take 1 capsule by mouth once a day 4)  Vytorin 10-40 Mg Tabs (Ezetimibe-simvastatin) .... Take 1 tablet by mouth once a day 5)  Gnp Allergy Relief 10 Mg Tbdp (Loratadine) .... Once daily 6)  Warfarin Sodium 5 Mg Tabs (Warfarin sodium) .... Once daily as directed 7)  Cvs Daily Multiple Tabs (Multiple vitamin) .Marland Kitchen.. 1 once daily 8)  Digoxin 0.125 Mg Tabs (Digoxin) .... Take 1 tablet by mouth once a day 9)  Systane Nighttime Oint (White petrolatum-mineral oil) .... Once daily as directed 10)  Lisinopril 20 Mg Tabs (Lisinopril) .Marland Kitchen.. 1 tablet by mouth daily   Patient Instructions: 1)  Drink clear liquids only for the next 24 hours, then slowly add other liquids and food as you  tolerate them. 2)  call if pain or diarrhea recur   ]

## 2010-02-09 NOTE — Assessment & Plan Note (Signed)
Summary: 6 month rov/njr/pt rescd per dr//ccm   Vital Signs:  Patient profile:   77 year old female Weight:      140 pounds Temp:     98.1 degrees F oral Pulse rate:   88 / minute Pulse rhythm:   regular BP sitting:   146 / 90  (left arm) Cuff size:   regular  Vitals Entered By: Alfred Levins, CMA (January 31, 2010 10:39 AM)  Serial Vital Signs/Assessments:  Time      Position  BP       Pulse  Resp  Temp     By                     140/90                         [name]  CC: 6 mth f/u , gout   CC:  6 mth f/u  and gout.  History of Present Illness:  Follow-Up Visit      This is an 77 year old woman who presents for Follow-up visit.  The patient denies chest pain and palpitations.  Since the last visit the patient notes no new problems or concerns.  The patient reports taking meds as prescribed.  When questioned about possible medication side effects, the patient notes none.  no recurrent gout  All other systems reviewed and were negative   Current Medications (verified): 1)  Alprazolam 0.5 Mg Tabs (Alprazolam) .... Take 1 Tablet By Mouth Once A Day 2)  Omeprazole 20 Mg Cpdr (Omeprazole) .... Take 1 Capsule By Mouth Once A Day 3)  Gnp Allergy Relief 10 Mg  Tbdp (Loratadine) .... Once Daily 4)  Warfarin Sodium 5 Mg Tabs (Warfarin Sodium) .... Once Daily As Directed 5)  Cvs Daily Multiple   Tabs (Multiple Vitamin) .Marland Kitchen.. 1 Once Daily 6)  Digoxin 0.125 Mg  Tabs (Digoxin) .... Take 1 Tablet By Mouth Once A Day 7)  Lisinopril 20 Mg Tabs (Lisinopril) .Marland Kitchen.. 1 Tablet By Mouth Daily 8)  Optive 0.5-0.9 % Soln (Carboxymethylcellul-Glycerin) 9)  Colcrys 0.6 Mg Tabs (Colchicine) .Marland Kitchen.. 1 Three Times A Day For Acute Gout Flare 10)  Allopurinol 300 Mg Tabs (Allopurinol) .... Take 1 Tab Every Day 11)  Lipitor 40 Mg Tabs (Atorvastatin Calcium) .... One  By Mouth Daily  Allergies (verified): 1)  ! Acetyl Salicylic Acid (Aspirin)  Past History:  Past Medical History: Last updated:  02/18/2007 Anxiety GERD-there was a qustion of Barrett's-not documented on Bx Hyperlipidemia Hypertension Mohs procedure.  Atrial fibrillation Colon Polyps  Past Surgical History: Last updated: 04/04/2009 bcc-face Hysterectomy Cataract extraction--left eye  Family History: Last updated: 10/15/2006 Family History of Colon CA 1st degree relative <60 Family History Breast cancer 1st degree relative <50 Family History Hypertension  Social History: Last updated: 09/23/2006 Retired Regular exercise-no Married  Risk Factors: Exercise: no (08/19/2009)  Risk Factors: Smoking Status: never (08/19/2009)  Physical Exam  General:  alert and well-developed.   Head:  normocephalic and atraumatic.   Eyes:  pupils equal and pupils round.   Ears:  R ear normal and L ear normal.   Neck:  no JVD Chest Wall:  No deformities, masses, or tenderness noted. Lungs:  normal respiratory effort and no intercostal retractions.   Heart:  normal rate and regular rhythm.   Abdomen:  soft.  nontender Neurologic:  cranial nerves II-XII intact and gait normal.   Skin:  turgor normal  and color normal.   Psych:  normally interactive and good eye contact.     Impression & Recommendations:  Problem # 1:  GOUT (ICD-274.9)  no recurrence The following medications were removed from the medication list:    Colcrys 0.6 Mg Tabs (Colchicine) .Marland Kitchen... 1 three times a day for acute gout flare Her updated medication list for this problem includes:    Allopurinol 300 Mg Tabs (Allopurinol) .Marland Kitchen... Take 1 tab every day  Orders: TLB-Uric Acid, Blood (84550-URIC)  Problem # 2:  ATRIAL FIBRILLATION (ICD-427.31)  Her updated medication list for this problem includes:    Warfarin Sodium 5 Mg Tabs (Warfarin sodium) ..... Once daily as directed    Digoxin 0.125 Mg Tabs (Digoxin) .Marland Kitchen... Take 1 tablet by mouth once a day  Reviewed the following: PT: 21.4 (06/02/2009)   INR: 2.0 (12/30/2009) Coumadin Dose (weekly):  N/A (03/03/2007) Prior Coumadin Dose (weekly): N/A (03/03/2007) Next Protime: 4 weeks (dated on 12/30/2009)  Problem # 3:  HYPERLIPIDEMIA (ICD-272.4)  she has not started lipitor because she had crestor-- she will start lipitor soon.  Her updated medication list for this problem includes:    Lipitor 40 Mg Tabs (Atorvastatin calcium) ..... One  by mouth daily  Orders: TLB-Hepatic/Liver Function Pnl (80076-HEPATIC) TLB-Lipid Panel (80061-LIPID)  Complete Medication List: 1)  Alprazolam 0.5 Mg Tabs (Alprazolam) .... Take 1 tablet by mouth once a day 2)  Omeprazole 20 Mg Cpdr (Omeprazole) .... Take 1 capsule by mouth once a day 3)  Gnp Allergy Relief 10 Mg Tbdp (Loratadine) .... Once daily 4)  Warfarin Sodium 5 Mg Tabs (Warfarin sodium) .... Once daily as directed 5)  Cvs Daily Multiple Tabs (Multiple vitamin) .Marland Kitchen.. 1 once daily 6)  Digoxin 0.125 Mg Tabs (Digoxin) .... Take 1 tablet by mouth once a day 7)  Lisinopril 20 Mg Tabs (Lisinopril) .Marland Kitchen.. 1 tablet by mouth daily 8)  Optive 0.5-0.9 % Soln (Carboxymethylcellul-glycerin) 9)  Allopurinol 300 Mg Tabs (Allopurinol) .... Take 1 tab every day 10)  Lipitor 40 Mg Tabs (Atorvastatin calcium) .... One  by mouth daily  Other Orders: Venipuncture (78295) TLB-A1C / Hgb A1C (Glycohemoglobin) (83036-A1C) TLB-BMP (Basic Metabolic Panel-BMET) (80048-METABOL) TLB-CBC Platelet - w/Differential (85025-CBCD) Protime (62130QM)  Patient Instructions: 1)  Please schedule a follow-up appointment in 6 months. Prescriptions: LISINOPRIL 20 MG TABS (LISINOPRIL) 1 tablet by mouth daily  #90 Tablet x 3   Entered and Authorized by:   [name]   Signed by:   [name] on 01/31/2010   Method used:   Electronically to        [facility]  [address]  [phone]* (retail)       [address] [place], [place]  [address]       Ph: [phone] or [phone]       Fax: [phone]   RxID:   [id number]    Orders Added: 1)  Venipuncture  [16606] 2)  TLB-A1C / Hgb A1C (Glycohemoglobin) [83036-A1C] 3)  TLB-Uric Acid, Blood [84550-URIC] 4)  TLB-BMP (Basic Metabolic Panel-BMET) [80048-METABOL] 5)  TLB-Hepatic/Liver Function Pnl [80076-HEPATIC] 6)  TLB-Lipid Panel [80061-LIPID] 7)  TLB-CBC Platelet - w/Differential [85025-CBCD] 8)  Protime [85610QW] 9)  Est. Patient Level IV [30160]  Appended Document: Orders Update     Clinical Lists Changes  Orders: Added new Service order of Specimen Handling (10932) - Signed      Appended Document: 6 month rov/njr/pt rescd per dr//ccm   ANTICOAGULATION RECORD PREVIOUS REGIMEN &  LAB RESULTS Anticoagulation Diagnosis:  Atrial fibrillation on  10/18/2006 Previous INR Goal Range:  2.0-3.0 on  10/18/2006 Previous INR:  2.0 on  12/30/2009 Previous Coumadin Dose(mg):  None only on  thursdays then 2.5mg  qd on  08/04/2009 Previous Regimen:  same on  12/30/2009 Previous Coagulation Comments:  I told patient Dr. Cato Mulligan wanted her to only take tylenol NO celebrex. Then patient spoke with Dr. Cato Mulligan. on  12/05/2009  NEW REGIMEN & LAB RESULTS Current INR: 2.3 Regimen: same  Repeat testing in: 4 weeks  Anticoagulation Visit Questionnaire Coumadin dose missed/changed:  No Abnormal Bleeding Symptoms:  No  Any diet changes including alcohol intake, vegetables or greens since the last visit:  No Any illnesses or hospitalizations since the last visit:  No Any signs of clotting since the last visit (including chest discomfort, dizziness, shortness of breath, arm tingling, slurred speech, swelling or redness in leg):  No  MEDICATIONS ALPRAZOLAM 0.5 MG TABS (ALPRAZOLAM) Take 1 tablet by mouth once a day OMEPRAZOLE 20 MG CPDR (OMEPRAZOLE) Take 1 capsule by mouth once a day GNP ALLERGY RELIEF 10 MG  TBDP (LORATADINE) once daily WARFARIN SODIUM 5 MG TABS (WARFARIN SODIUM) once daily as directed [facility] DAILY MULTIPLE   TABS (MULTIPLE VITAMIN) 1 once daily DIGOXIN 0.125 MG  TABS (DIGOXIN)  Take 1 tablet by mouth once a day LISINOPRIL 20 MG TABS (LISINOPRIL) 1 tablet by mouth daily OPTIVE 0.5-0.9 % SOLN (CARBOXYMETHYLCELLUL-GLYCERIN)  ALLOPURINOL 300 MG TABS (ALLOPURINOL) Take 1 tab every day LIPITOR 40 MG TABS (ATORVASTATIN CALCIUM) one  by mouth daily    Laboratory Results   Blood Tests      INR: 2.3   (Normal Range: 0.88-1.12   Therap INR: 2.0-3.5) Comments: [name]  January 31, 2010 11:02 AM

## 2010-02-09 NOTE — Progress Notes (Signed)
Summary: URI   Phone Note Call from Patient   Caller: Patient  walks in office Call For: Dr. Cato Mulligan Reason for Call: Privacy/Consent Authorization Summary of Call: Pt walks in the office complaining of URI x 2 days. No fever, minimal cough, not lower respiratory symptoms.  Is on Coumadin?  What can she take that will not interfere with her meds.  She is on allergy relief.  Advised that to stop this if we call in a different decongestent/antihisamine. [phone] Initial call taken by: Lynann Beaver CMA,  January 08, 2008 10:30 AM  Follow-up for Phone Call        ok totake but monitor her BP  Follow-up by: Madelin Headings MD,  January 08, 2008 12:55 PM  Additional Follow-up for Phone Call Additional follow up Details #1::        Pt. given Dr. Rosezella Florida recommedations. Additional Follow-up by: Lynann Beaver CMA,  January 08, 2008 1:12 PM

## 2010-02-09 NOTE — Progress Notes (Signed)
Summary: gout med ?  Phone Note Call from Patient Call back at [phone]   Caller: vm Call For: Sinthia Karabin Summary of Call: Gout med from yesterday.  If foot better, do I need to take up all the med? Initial call taken by: Rudy Jew, RN,  December 25, 2007 9:56 AM  Follow-up for Phone Call        ok to stop Follow-up by: [name],  December 25, 2007 11:58 AM  Additional Follow-up for Phone Call Additional follow up Details #1::        Patinet aware. Additional Follow-up by: Rudy Jew, RN,  December 25, 2007 12:46 PM

## 2010-02-09 NOTE — Progress Notes (Signed)
Summary: hypertension  Phone Note Call from Patient   Caller: Patient Call For: Japji Kok Summary of Call: pt walked in with a list of BP readings of 167/98, 181/104, and 166/101.  She is currently taking Lisinopril 20mg .  Per Dr Cato Mulligan increase Lisinopril to 40mg  and make appt to see any Dr next week.  Pt already has appt scheduled 01/31/10.  Pt aware to increase med and keep a record of BP readings and keep appt with Dr Cato Mulligan Initial call taken by: Alfred Levins, CMA,  January 18, 2010 12:02 PM

## 2010-02-09 NOTE — Progress Notes (Signed)
Summary: refills HCTZ, omeprazole, vytorin  Phone Note From Pharmacy   Caller: [facility] Call For: dr swords  Details for Reason: refills Summary of Call: patient would like refills on HCTZ, Omeprazole, vytorin Initial call taken by: Kern Reap CMA,  March 11, 2008 10:18 AM      Prescriptions: VYTORIN 10-40 MG TABS (EZETIMIBE-SIMVASTATIN) Take 1 tablet by mouth once a day  #90 x 3   Entered and Authorized by:   Kern Reap CMA   Signed by:   Kern Reap CMA on 03/11/2008   Method used:   Faxed to ...       [facility] (retail)             , [place]         Ph:        Fax: [phone]   RxID:   [id number] OMEPRAZOLE 20 MG CPDR (OMEPRAZOLE) Take 1 capsule by mouth once a day  #90 x 3   Entered and Authorized by:   Kern Reap CMA   Signed by:   Kern Reap CMA on 03/11/2008   Method used:   Faxed to ...       [facility] (retail)             , [place]         Ph:        Fax: [phone]   RxID:   [id number] HYDROCHLOROTHIAZIDE 25 MG TABS (HYDROCHLOROTHIAZIDE) Take 1 tablet by mouth every morning  #90 x 3   Entered and Authorized by:   Kern Reap CMA   Signed by:   Kern Reap CMA on 03/11/2008   Method used:   Faxed to ...       [facility] (retail)             , [place]         Ph:        Fax: [phone]   RxID:   [id number]

## 2010-02-09 NOTE — Assessment & Plan Note (Signed)
Summary: pt/mhf   Nurse Visit   Vital Signs:  Patient Profile:   77 Years Old Female Pulse rate:   88 / minute BP sitting:   122 / 78  (left arm)                 Prior Medications: ALPRAZOLAM 0.5 MG TABS (ALPRAZOLAM) Take 1 tablet by mouth once a day HYDROCHLOROTHIAZIDE 25 MG TABS (HYDROCHLOROTHIAZIDE) Take 1 tablet by mouth every morning OMEPRAZOLE 20 MG CPDR (OMEPRAZOLE) Take 1 capsule by mouth once a day VYTORIN 10-40 MG TABS (EZETIMIBE-SIMVASTATIN) Take 1 tablet by mouth once a day GNP ALLERGY RELIEF 10 MG  TBDP (LORATADINE) once daily WARFARIN SODIUM 5 MG TABS (WARFARIN SODIUM) once daily as directed [facility] DAILY MULTIPLE   TABS (MULTIPLE VITAMIN) 1 once daily DIGOXIN 0.125 MG  TABS (DIGOXIN) Take 1 tablet by mouth once a day SYSTANE NIGHTTIME  OINT (WHITE PETROLATUM-MINERAL OIL) once daily as directed LISINOPRIL 20 MG TABS (LISINOPRIL) 1 tablet by mouth daily Current Allergies: ! ACETYL SALICYLIC ACID (ASPIRIN) Laboratory Results   Blood Tests     PT: 14.7 s   (Normal Range: 10.6-13.4)  INR: 1.4   (Normal Range: 0.88-1.12   Therap INR: 2.0-3.5) Comments: [name]  January 26, 2008 10:59 AM       Orders Added: 1)  Est. Patient Level I [99211] 2)  Fingerstick [36416] 3)  Protime Ila.[email or web address]    ]  Vital Signs:  Patient Profile:   77 Years Old Female Pulse rate:   88 / minute BP sitting:   122 / 78                   ANTICOAGULATION RECORD PREVIOUS REGIMEN & LAB RESULTS Anticoagulation Diagnosis:  Atrial fibrillation on  10/18/2006 Previous INR Goal Range:  2.0-3.0 on  10/18/2006 Previous INR:  1.9 on  12/24/2007 Previous Coumadin Dose(mg):  2.5X5DAYS/NONE  ON TU,TH on  06/18/2007 Previous Regimen:  same dose on  12/24/2007 Previous Coagulation Comments:  Approved by Dr. Lovell Sheehan. on  10/22/2007  NEW REGIMEN & LAB RESULTS Current INR: 1.4 Regimen: 2.5 QD  Repeat testing in: 2 weeks  Anticoagulation Visit Questionnaire Coumadin dose  missed/changed:  No Abnormal Bleeding Symptoms:  No  Any diet changes including alcohol intake, vegetables or greens since the last visit:  No Any illnesses or hospitalizations since the last visit:  No Any signs of clotting since the last visit (including chest discomfort, dizziness, shortness of breath, arm tingling, slurred speech, swelling or redness in leg):  No  MEDICATIONS ALPRAZOLAM 0.5 MG TABS (ALPRAZOLAM) Take 1 tablet by mouth once a day HYDROCHLOROTHIAZIDE 25 MG TABS (HYDROCHLOROTHIAZIDE) Take 1 tablet by mouth every morning OMEPRAZOLE 20 MG CPDR (OMEPRAZOLE) Take 1 capsule by mouth once a day VYTORIN 10-40 MG TABS (EZETIMIBE-SIMVASTATIN) Take 1 tablet by mouth once a day GNP ALLERGY RELIEF 10 MG  TBDP (LORATADINE) once daily WARFARIN SODIUM 5 MG TABS (WARFARIN SODIUM) once daily as directed [facility] DAILY MULTIPLE   TABS (MULTIPLE VITAMIN) 1 once daily DIGOXIN 0.125 MG  TABS (DIGOXIN) Take 1 tablet by mouth once a day SYSTANE NIGHTTIME  OINT (WHITE PETROLATUM-MINERAL OIL) once daily as directed LISINOPRIL 20 MG TABS (LISINOPRIL) 1 tablet by mouth daily

## 2010-02-09 NOTE — Progress Notes (Signed)
Summary: INFO ONLY - PT FOUND MED   Phone Note Call from Patient   Caller: Patient Summary of Call: INFO ONLY.... Pt called to adv that she has found her medicine bottle.... had lost it, thought she may have left it at the office but she now has it.  Initial call taken by: Debbra Riding,  December 16, 2009 12:04 PM

## 2010-02-09 NOTE — Assessment & Plan Note (Signed)
Summary: 6 mo rov/mm pt rsc/njr   Vital Signs:  Patient profile:   77 year old female Height:      64 inches Weight:      132 pounds BMI:     22.74 Temp:     98.3 degrees F oral Pulse rhythm:   irregular BP sitting:   126 / 80  (left arm) Cuff size:   regular  Vitals Entered By: Kern Reap CMA Duncan Dull) (September 24, 2008 11:26 AM)  Reason for Visit 6 month follow up  History of Present Illness:  Follow-Up Visit      This is an 77 year old woman who presents for Follow-up visit.  The patient denies chest pain, palpitations, dizziness, syncope, edema, SOB, DOE, PND, and orthopnea.  The patient reports taking meds as prescribed and not monitoring BP.  When questioned about possible medication side effects, the patient notes none.   All other systems reviewed and were negative   Current Problems (verified): 1)  Gout  (ICD-274.9) 2)  Coumadin Therapy  (ICD-V58.61) 3)  Encounter For Therapeutic Drug Monitoring  (ICD-V58.83) 4)  Tricuspid Regurgitation  (ICD-397.0) 5)  Hyperglycemia  (ICD-790.29) 6)  Atrial Fibrillation  (ICD-427.31) 7)  Family History Breast Cancer 1st Degree Relative <50  (ICD-V16.3) 8)  Family History of Colon Ca 1st Degree Relative <60  (ICD-V16.0) 9)  Hypertension  (ICD-401.9) 10)  Hyperlipidemia  (ICD-272.4) 11)  Gerd  (ICD-530.81) 12)  Anxiety  (ICD-300.00)  Current Medications (verified): 1)  Alprazolam 0.5 Mg Tabs (Alprazolam) .... Take 1 Tablet By Mouth Once A Day 2)  Hydrochlorothiazide 25 Mg Tabs (Hydrochlorothiazide) .... Take 1 Tablet By Mouth Every Morning 3)  Omeprazole 20 Mg Cpdr (Omeprazole) .... Take 1 Capsule By Mouth Once A Day 4)  Vytorin 10-40 Mg Tabs (Ezetimibe-Simvastatin) .... Take 1 Tablet By Mouth Once A Day 5)  Gnp Allergy Relief 10 Mg  Tbdp (Loratadine) .... Once Daily 6)  Warfarin Sodium 5 Mg Tabs (Warfarin Sodium) .... Once Daily As Directed 7)  Cvs Daily Multiple   Tabs (Multiple Vitamin) .Marland Kitchen.. 1 Once Daily 8)  Digoxin 0.125  Mg  Tabs (Digoxin) .... Take 1 Tablet By Mouth Once A Day 9)  Systane Nighttime  Oint (White Petrolatum-Mineral Oil) .... Once Daily As Directed 10)  Lisinopril 20 Mg Tabs (Lisinopril) .Marland Kitchen.. 1 Tablet By Mouth Daily  Allergies: 1)  ! Acetyl Salicylic Acid (Aspirin)  Past History:  Past Medical History: Last updated: 02/18/2007 Anxiety GERD-there was a qustion of Barrett's-not documented on Bx Hyperlipidemia Hypertension Mohs procedure.  Atrial fibrillation Colon Polyps  Past Surgical History: Last updated: 09/23/2006 bcc-face Hysterectomy  Family History: Last updated: 10/15/2006 Family History of Colon CA 1st degree relative <60 Family History Breast cancer 1st degree relative <50 Family History Hypertension  Social History: Last updated: 09/23/2006 Retired Regular exercise-no Married  Risk Factors: Exercise: no (09/23/2006)  Review of Systems       All other systems reviewed and were negative   Physical Exam  General:  Well-developed,well-nourished,in no acute distress; alert,appropriate and cooperative throughout examination Head:  normocephalic and atraumatic.   Eyes:  vision grossly intact and pupils round.   Ears:  R ear normal and L ear normal.   Nose:  no external deformity and no external erythema.   Neck:  cervical spine tenderness Lungs:  Normal respiratory effort, chest expands symmetrically. Lungs are clear to auscultation, no crackles or wheezes. Heart:  Normal rate and irregular rhythm. S1 and S2 normal without gallop,  murmur, click, rub or other extra sounds.   Impression & Recommendations:  Problem # 1:  ATRIAL FIBRILLATION (ICD-427.31) well controlled continue current medications  Her updated medication list for this problem includes:    Warfarin Sodium 5 Mg Tabs (Warfarin sodium) ..... Once daily as directed    Digoxin 0.125 Mg Tabs (Digoxin) .Marland Kitchen... Take 1 tablet by mouth once a day  Reviewed the following: PT: 16.8 (08/17/2008)   INR:  1.9 (08/17/2008) Coumadin Dose (weekly): N/A (03/03/2007) Prior Coumadin Dose (weekly): N/A (03/03/2007) Next Protime: 4 weeks (dated on 08/17/2008)  Orders: Protime (52841LK) TLB-TSH (Thyroid Stimulating Hormone) (84443-TSH)  Problem # 2:  HYPERTENSION (ICD-401.9) controlled continue current medications  Her updated medication list for this problem includes:    Hydrochlorothiazide 25 Mg Tabs (Hydrochlorothiazide) .Marland Kitchen... Take 1 tablet by mouth every morning    Lisinopril 20 Mg Tabs (Lisinopril) .Marland Kitchen... 1 tablet by mouth daily  BP today: 126/80 Prior BP: 124/82 (08/17/2008)  Prior 10 Yr Risk Heart Disease: Not enough information (10/15/2006)  Labs Reviewed: K+: 3.4 (03/24/2008) Creat: : 0.9 (03/24/2008)   Chol: 223 (03/24/2008)   HDL: 43.10 (03/24/2008)   LDL: DEL (09/22/2007)   TG: 489.0 (03/24/2008)  Orders: Protime (44010UV) Venipuncture (25366) TLB-BMP (Basic Metabolic Panel-BMET) (80048-METABOL)  Problem # 3:  TRICUSPID REGURGITATION (ICD-397.0)  Her updated medication list for this problem includes:    Warfarin Sodium 5 Mg Tabs (Warfarin sodium) ..... Once daily as directed  Problem # 4:  HYPERGLYCEMIA (ICD-790.29) check labs today  Problem # 5:  HYPERLIPIDEMIA (ICD-272.4) can't afford vytorin check labs The following medications were removed from the medication list:    Vytorin 10-40 Mg Tabs (Ezetimibe-simvastatin) .Marland Kitchen... Take 1 tablet by mouth once a day Her updated medication list for this problem includes:    Simvastatin 40 Mg Tabs (Simvastatin) .Marland Kitchen... Take one tablet at bedtime  Labs Reviewed: SGOT: 30 (03/24/2008)   SGPT: 32 (03/24/2008)  Lipid Goals: Chol Goal: 200 (10/15/2006)   HDL Goal: 40 (10/15/2006)   LDL Goal: 130 (10/15/2006)   TG Goal: 150 (10/15/2006)  Prior 10 Yr Risk Heart Disease: Not enough information (10/15/2006)   HDL:43.10 (03/24/2008), 33.5 (09/22/2007)  LDL:DEL (09/22/2007), DEL (04/01/2007)  Chol:223 (03/24/2008), 169 (09/22/2007)   Trig:489.0 (03/24/2008), 544 (09/22/2007)  Orders: Protime (44034VQ) TLB-Lipid Panel (80061-LIPID) TLB-Hepatic/Liver Function Pnl (80076-HEPATIC)  Complete Medication List: 1)  Alprazolam 0.5 Mg Tabs (Alprazolam) .... Take 1 tablet by mouth once a day 2)  Hydrochlorothiazide 25 Mg Tabs (Hydrochlorothiazide) .... Take 1 tablet by mouth every morning 3)  Omeprazole 20 Mg Cpdr (Omeprazole) .... Take 1 capsule by mouth once a day 4)  Gnp Allergy Relief 10 Mg Tbdp (Loratadine) .... Once daily 5)  Warfarin Sodium 5 Mg Tabs (Warfarin sodium) .... Once daily as directed 6)  Cvs Daily Multiple Tabs (Multiple vitamin) .Marland Kitchen.. 1 once daily 7)  Digoxin 0.125 Mg Tabs (Digoxin) .... Take 1 tablet by mouth once a day 8)  Systane Nighttime Oint (White petrolatum-mineral oil) .... Once daily as directed 9)  Lisinopril 20 Mg Tabs (Lisinopril) .Marland Kitchen.. 1 tablet by mouth daily 10)  Simvastatin 40 Mg Tabs (Simvastatin) .... Take one tablet at bedtime  Patient Instructions: 1)  Please schedule a follow-up appointment in 6 months. 2)  IN addition schedule flu immunization with next protime Prescriptions: SIMVASTATIN 40 MG TABS (SIMVASTATIN) Take one tablet at bedtime  #90 x 3   Entered and Authorized by:   [name]   Signed by:   [name]  on 09/24/2008   Method used:   Electronically to        [facility]  [phone][facility] (retail)       [address] [place], [place]  [address]       Ph: [phone] or [phone]       Fax: [phone]   RxID:   [id number]   Appended Document: 6 mo rov/mm pt rsc/njr  Laboratory Results   Blood Tests     PT: 20.8 s   (Normal Range: 10.6-13.4)  INR: 3.0   (Normal Range: 0.88-1.12   Therap INR: 2.0-3.5) Comments: [name]  September 24, 2008 12:03 PM       ANTICOAGULATION RECORD PREVIOUS REGIMEN & LAB RESULTS Anticoagulation Diagnosis:  Atrial fibrillation on  10/18/2006 Previous INR Goal Range:  2.0-3.0 on  10/18/2006 Previous  INR:  1.9 on  08/17/2008 Previous Coumadin Dose(mg):  none Mon. & Thurs others 2.5mg  on  06/21/2008 Previous Regimen:  Same  on  06/21/2008 Previous Coagulation Comments:  Approved by Dr. Lovell Sheehan. on  10/22/2007  NEW REGIMEN & LAB RESULTS Current INR: 3.0 Regimen: same  Repeat testing in: 1 month  Anticoagulation Visit Questionnaire Coumadin dose missed/changed:  No Abnormal Bleeding Symptoms:  No  Any diet changes including alcohol intake, vegetables or greens since the last visit:  No Any illnesses or hospitalizations since the last visit:  No Any signs of clotting since the last visit (including chest discomfort, dizziness, shortness of breath, arm tingling, slurred speech, swelling or redness in leg):  Yes  MEDICATIONS ALPRAZOLAM 0.5 MG TABS (ALPRAZOLAM) Take 1 tablet by mouth once a day HYDROCHLOROTHIAZIDE 25 MG TABS (HYDROCHLOROTHIAZIDE) Take 1 tablet by mouth every morning OMEPRAZOLE 20 MG CPDR (OMEPRAZOLE) Take 1 capsule by mouth once a day GNP ALLERGY RELIEF 10 MG  TBDP (LORATADINE) once daily WARFARIN SODIUM 5 MG TABS (WARFARIN SODIUM) once daily as directed [facility] DAILY MULTIPLE   TABS (MULTIPLE VITAMIN) 1 once daily DIGOXIN 0.125 MG  TABS (DIGOXIN) Take 1 tablet by mouth once a day SYSTANE NIGHTTIME  OINT (WHITE PETROLATUM-MINERAL OIL) once daily as directed LISINOPRIL 20 MG TABS (LISINOPRIL) 1 tablet by mouth daily SIMVASTATIN 40 MG TABS (SIMVASTATIN) Take one tablet at bedtime

## 2010-02-09 NOTE — Progress Notes (Signed)
Summary: REFILL-vytorin  Phone Note Call from Patient Call back at [facility] Phone [phone]   Caller: Patient Call For: SWORDS Summary of Call: VITORIN 10/40 MG TAKE 1 A DAY CALL IN TO [facility].  FAX # [phone] SHE IS DOWN TO 6 PILLS Initial call taken by: Roselle Locus,  February 20, 2007 12:30 PM  Follow-up for Phone Call        Rx written and will be faxed to [facility].Patient notified.  Follow-up by: Gladis Riffle, RN,  February 24, 2007 11:42 AM      Prescriptions: VYTORIN 10-40 MG TABS (EZETIMIBE-SIMVASTATIN) Take 1 tablet by mouth once a day  #90 x 3   Entered by:   Gladis Riffle, RN   Authorized by:   [name]   Signed by:   Gladis Riffle, RN on 02/24/2007   Method used:   Printed then faxed to ...       [facility]  [phone]*       [address]       [place], [place]  [address]       Ph: [phone] or [phone]       Fax: [phone]   RxID:   [id number]

## 2010-02-09 NOTE — Assessment & Plan Note (Signed)
Summary: 3 month f/up//db/resch with patient/jls   Vital Signs:  Patient Profile:   77 Years Old Female Weight:      136 pounds Temp:     98.6 degrees F Pulse rate:   88 / minute BP sitting:   142 / 74  (left arm)  Vitals Entered By: Gladis Riffle, RN (October 01, 2007 11:23 AM)                  Chief Complaint:  3 month rov and labs done--BP 120-130/67-70 at home--Rx alprazolam.  History of Present Illness: 77 yo female presents for ROV.  She says she is feeling well and denies complaints.  She denies problems with her medications.  She denies chest pain, dyspnea, orthopnea, edema, dyspnea on exertion, cough, fever, headache, abdominal pain, blood in stool, fatigue, diarrhea, constipation.  She reports taking one half of an alprazolam to sleep due to racing thoughts.  She denies other anxiety or depression.  She takes her blood pressure at home and reports values 120-130/67-70.  She reports two alcoholic drinks per day.      Past Medical History: Anxiety GERD-there was a qustion of Barrett's-not documented on Bx Hyperlipidemia Hypertension Mohs procedure.  Atrial fibrillation Colon Polyps  Past Surgical History: bcc-face Hysterectomy  Social History: Retired Regular exercise-no Married  Family History: Family History of Colon CA 1st degree relative <60 Family History Breast cancer 1st degree relative <50 Family History Hypertension  no other complaints in a complete ROS      Prior Medications Reviewed Using: Medication Bottles  Updated Prior Medication List: ALPRAZOLAM 0.5 MG TABS (ALPRAZOLAM) Take 1 tablet by mouth once a day HYDROCHLOROTHIAZIDE 25 MG TABS (HYDROCHLOROTHIAZIDE) Take 1 tablet by mouth every morning OMEPRAZOLE 20 MG CPDR (OMEPRAZOLE) Take 1 capsule by mouth once a day VYTORIN 10-40 MG TABS (EZETIMIBE-SIMVASTATIN) Take 1 tablet by mouth once a day GNP ALLERGY RELIEF 10 MG  TBDP (LORATADINE) once daily WARFARIN SODIUM 5 MG TABS (WARFARIN SODIUM)  once daily as directed [facility] DAILY MULTIPLE   TABS (MULTIPLE VITAMIN) 1 once daily DIGOXIN 0.125 MG  TABS (DIGOXIN) Take 1 tablet by mouth once a day SYSTANE NIGHTTIME  OINT (WHITE PETROLATUM-MINERAL OIL) once daily as directed LISINOPRIL 20 MG TABS (LISINOPRIL) 1 tablet by mouth daily  Current Allergies (reviewed today): ! ACETYL SALICYLIC ACID (ASPIRIN)     Review of Systems       ROS negative except per HPI   Physical Exam  General:     alert, well-developed, well-nourished, and well-hydrated, no distress Head:     normocephalic and atraumatic.   Eyes:     pupils equal, pupils round, pupils reactive to light, and no injection.   Ears:     no external deformities.   Nose:     no external deformity, no external erythema, and no nasal discharge.   Mouth:     pharynx pink and moist, no erythema, and no exudates.   Neck:     supple, full ROM, and no masses.   Lungs:     normal respiratory effort, no intercostal retractions, no accessory muscle use, normal breath sounds, no dullness, no crackles, and no wheezes.   Heart:     normal rate, no gallop, no rub, and no JVD, irregularly irregular Abdomen:     soft, non-tender, normal bowel sounds, no distention, no masses, no guarding, no rigidity, and no rebound tenderness.   Msk:     normal ROM, no joint tenderness, no joint  swelling, no joint warmth, no redness over joints, and no joint deformities.   Pulses:     R radial normal and L radial normal.   Extremities:     no edema Neurologic:     cranial nerves II-XII intact, strength normal in all extremities, sensation intact to light touch, sensation intact to pinprick, and gait normal.   Skin:     turgor normal, color normal, no rashes, no suspicious lesions, no ecchymoses, no petechiae, no purpura, no ulcerations, and no edema.   Cervical Nodes:     no anterior cervical adenopathy and no posterior cervical adenopathy.   Psych:     memory intact for recent and remote,  normally interactive, good eye contact, not anxious appearing, not depressed appearing, and not agitated.      Impression & Recommendations: Flu Vaccine Consent Questions     Do you have a history of severe allergic reactions to this vaccine? no    Any prior history of allergic reactions to egg and/or gelatin? no    Do you have a sensitivity to the preservative Thimersol? no    Do you have a past history of Guillan-Barre Syndrome? no    Do you currently have an acute febrile illness? no    Have you ever had a severe reaction to latex? no    Vaccine information given and explained to patient? yes    Are you currently pregnant? no    Lot Number:[id number]   Site Given  Left Deltoid IM   Problem # 2:  HYPERTENSION (ICD-401.9) Patient reports good home readings.  The patient's blood pressures have been appropriate over the last several visits.  Will re-evaluate at next appointment.     Her updated medication list for this problem includes:    Hydrochlorothiazide 25 Mg Tabs (Hydrochlorothiazide) .Marland Kitchen... Take 1 tablet by mouth every morning    Lisinopril 20 Mg Tabs (Lisinopril) .Marland Kitchen... 1 tablet by mouth daily  BP today: 142/74 Prior BP: 124/81 (08/19/2007)  Prior 10 Yr Risk Heart Disease: Not enough information (10/15/2006)  Labs Reviewed: Creat: 1.0 (09/22/2007) Chol: 169 (09/22/2007)   HDL: 33.5 (09/22/2007)   LDL: DEL (09/22/2007)   TG: 544 (09/22/2007)   Problem # 3:  HYPERLIPIDEMIA (ICD-272.4) Cholesterol is appropriate.  Elevated triglycerides may be secondary to alcohol consumption.  Have advised only one drink of alcohol per day.  Feel there is limited evidence to the benefit of controlling triglycerides.  Will continue current medication.    Her updated medication list for this problem includes:    Vytorin 10-40 Mg Tabs (Ezetimibe-simvastatin) .Marland Kitchen... Take 1 tablet by mouth once a day   Problem # 4:  ATRIAL FIBRILLATION (ICD-427.31) No symptoms.  Rate is appropriate today.   Will continue current medications.     Her updated medication list for this problem includes:    Warfarin Sodium 5 Mg Tabs (Warfarin sodium) ..... Once daily as directed    Digoxin 0.125 Mg Tabs (Digoxin) .Marland Kitchen... Take 1 tablet by mouth once a day   Problem # 5:  Preventive Health Care (ICD-V70.0) Pneumovax up-to-date.  Flu shot given.    Complete Medication List: 1)  Alprazolam 0.5 Mg Tabs (Alprazolam) .... Take 1 tablet by mouth once a day 2)  Hydrochlorothiazide 25 Mg Tabs (Hydrochlorothiazide) .... Take 1 tablet by mouth every morning 3)  Omeprazole 20 Mg Cpdr (Omeprazole) .... Take 1 capsule by mouth once a day 4)  Vytorin 10-40 Mg Tabs (Ezetimibe-simvastatin) .... Take 1 tablet by mouth once  a day 5)  Gnp Allergy Relief 10 Mg Tbdp (Loratadine) .... Once daily 6)  Warfarin Sodium 5 Mg Tabs (Warfarin sodium) .... Once daily as directed 7)  Cvs Daily Multiple Tabs (Multiple vitamin) .Marland Kitchen.. 1 once daily 8)  Digoxin 0.125 Mg Tabs (Digoxin) .... Take 1 tablet by mouth once a day 9)  Systane Nighttime Oint (White petrolatum-mineral oil) .... Once daily as directed 10)  Lisinopril 20 Mg Tabs (Lisinopril) .Marland Kitchen.. 1 tablet by mouth daily  Other Orders: Flu Vaccine 10yrs + (04540) Administration Flu vaccine (J8119)  Osteoporosis Risk Assessment:  Risk Factors for Fracture or Low Bone Density:   Race (White or Asian):     yes  Immunization & Chemoprophylaxis:    Influenza vaccine: Fluvax 3+  (10/01/2007)    Pneumovax: Historical  (10/09/2002)    ]

## 2010-02-09 NOTE — Progress Notes (Signed)
Summary: refill  Phone Note Call from Patient Call back at Home Phone [phone]   Caller: pt live Call For: swords Summary of Call: [facility]olam [facility]  Initial call taken by: Roselle Locus,  March 15, 2008 11:07 AM  Follow-up for Phone Call        ok once daily tab as needed anxiety #30/3 Follow-up by: [name],  March 15, 2008 4:21 PM      Prescriptions: ALPRAZOLAM 0.5 MG TABS (ALPRAZOLAM) Take 1 tablet by mouth once a day  #30 x 3   Entered by:   Kern Reap CMA   Authorized by:   [name]   Signed by:   Kern Reap CMA on 03/15/2008   Method used:   Telephoned to ...       [facility]  [phone]* (retail)       [address] [place], [place]  [address]       Ph: [phone] or [phone]       Fax: [phone]   RxID:   [id number]

## 2010-02-09 NOTE — Medication Information (Signed)
Summary: RX Folder  RX Folder   Imported By: Kassie Mends 03/04/2007 09:01:00  _____________________________________________________________________  External Attachment:    Type:   Image     Comment:   rx

## 2010-02-09 NOTE — Progress Notes (Signed)
  Phone Note Call from Patient Call back at Home Phone [phone]   Caller: Son Call For: [name] Summary of Call: Son has questions about Celebrex. She needs to take more than 2 daily.  Has increased pain recently. Initial call taken by: [facility] CMA AAMA,  November 30, 2009 11:09 AM  Follow-up for Phone Call        should not take any NSAID regularly.  she was supposed to limit celebrex to occassionally.  d/c celebrex due to warfarin interaction start acetaminophen 650mg  by mouth with each meal Follow-up by: [name],  November 30, 2009 1:38 PM  Additional Follow-up for Phone Call Additional follow up Details #1::        spoke with pt, and she understands........ scheduled her PT and INR Additional Follow-up by: Lynann Beaver CMA AAMA,  November 30, 2009 2:04 PM

## 2010-02-09 NOTE — Progress Notes (Signed)
Summary: question about med  Phone Note Call from Patient Call back at Home Phone [phone]   Caller: Patient Call For: [name] Summary of Call: pt is taking Crestor 20mg  and she wants to know if you could give her 40mg  and let her cut the pill in half.  I gave her 2wks of samples on the 20mg  I also didn't know if you wanted her to repeat Lipids Initial call taken by: Alfred Levins, CMA,  October 31, 2009 4:51 PM  Follow-up for Phone Call        ok Follow-up by: [name],  November 01, 2009 11:52 AM  Additional Follow-up for Phone Call Additional follow up Details #1::        Phone Call Completed, Rx Called In Additional Follow-up by: Alfred Levins, CMA,  November 01, 2009 3:36 PM    New/Updated Medications: CRESTOR 40 MG TABS (ROSUVASTATIN CALCIUM) 1/2 by mouth once daily Prescriptions: CRESTOR 40 MG TABS (ROSUVASTATIN CALCIUM) 1/2 by mouth once daily  #30 x 2   Entered by:   Alfred Levins, CMA   Authorized by:   [name]   Signed by:   Alfred Levins, CMA on 11/01/2009   Method used:   Electronically to        [facility]  [phone]* (retail)       [address] [place], [place]  [address]       Ph: [phone] or [phone]       Fax: [phone]   RxID:   [id number]

## 2010-02-09 NOTE — Assessment & Plan Note (Signed)
Summary: ROA/IF   Vital Signs:  Patient Profile:   77 Years Old Female Weight:      154 pounds Pulse rate:   98 / minute Pulse rhythm:   irregular BP sitting:   140 / 78  Vitals Entered By: Lynann Beaver CMA (October 23, 2006 4:20 PM)                 Chief Complaint:  discuss echo results.  History of Present Illness: f/u afib--no sxs. She is feeling better.Edema has resolved She has a lot of questions about alcohol use and afib and warfarin use.  home BPS 120-150/70-84. no palpitations, no neurologic deficits  Current Allergies: ! ACETYL SALICYLIC ACID (ASPIRIN)  Past Medical History:    Reviewed history from 10/15/2006 and no changes required:       Anxiety       GERD-there was a qustion of Barrett's-not documented on Bx       Hyperlipidemia       Hypertension       Mohs procedure.        Atrial fibrillation  Past Surgical History:    Reviewed history from 09/23/2006 and no changes required:       bcc-face       Hysterectomy   Family History:    Reviewed history from 10/15/2006 and no changes required:       Family History of Colon CA 1st degree relative <60       Family History Breast cancer 1st degree relative <50       Family History Hypertension  Social History:    Reviewed history from 09/23/2006 and no changes required:       Retired       Regular exercise-no       Married    Review of Systems       r shoulder pain chronic, ongoing for months   Physical Exam  General:     Well-developed,well-nourished,in no acute distress; alert,appropriate and cooperative throughout examination Head:     normocephalic.   Eyes:     pupils equal and pupils round.   Neck:     No deformities, masses, or tenderness noted. Chest Wall:     No deformities, masses, or tenderness noted. Lungs:     Normal respiratory effort, chest expands symmetrically. Lungs are clear to auscultation, no crackles or wheezes. Heart:     irr, ir rhythm S1 and S2 normal  without gallop, murmur, click, rub or other extra sounds. Abdomen:     Bowel sounds positive,abdomen soft and non-tender without masses, organomegaly or hernias noted. Msk:     No deformity or scoliosis noted of thoracic or lumbar spine.   Pulses:     R and L carotid,radial,femoral,dorsalis pedis and posterior tibial pulses are full and equal bilaterally Extremities:     No clubbing, cyanosis, edema, or deformity noted   Neurologic:     No cranial nerve deficits noted. Station and gait are normal. Sensory, motor and coordinative functions appear intact. Skin:     Intact without suspicious lesions or rashes Psych:     Cognition and judgment appear intact. Alert and cooperative with normal attention span and concentration. No apparent delusions, illusions, hallucinations    Impression & Recommendations:  Problem # 1:  ATRIAL FIBRILLATION (ICD-427.31) reviewed echo,  Her updated medication list for this problem includes:    Warfarin Sodium 5 Mg Tabs (Warfarin sodium) .Marland Kitchen...     Digoxin 0.125 Mg Tabs (Digoxin) .Marland KitchenMarland KitchenMarland KitchenMarland Kitchen  Once daily  Her updated medication list for this problem includes:    Warfarin Sodium 5 Mg Tabs (Warfarin sodium) .Marland Kitchen... 01/08/10/1/11/2    Digoxin 0.125 Mg Tabs (Digoxin) ..... Once daily  Orders: Protime (16109UE) Fingerstick (45409)   Problem # 2:  TRICUSPID REGURGITATION (ICD-397.0)  Her updated medication list for this problem includes:    Warfarin Sodium 5 Mg Tabs (Warfarin sodium) .Marland Kitchen... 01/08/10/1/11/2 see echo  Complete Medication List: 1)  Alprazolam 0.5 Mg Tabs (Alprazolam) .... Take 1 tablet by mouth once a day 2)  Hydrochlorothiazide 25 Mg Tabs (Hydrochlorothiazide) .... Take 1 tablet by mouth every morning 3)  Omeprazole 20 Mg Cpdr (Omeprazole) .... Take 1 capsule by mouth once a day 4)  Vytorin 10-40 Mg Tabs (Ezetimibe-simvastatin) .... Take 1 tablet by mouth once a day 5)  Gnp Allergy Relief 10 Mg Tbdp (Loratadine) .... ???? 6)  Warfarin Sodium 5 Mg  Tabs (Warfarin sodium) .... 01/08/10/1/11/2 7)  Dilt-xr 240 Mg Cp24 (Diltiazem hcl) .Marland Kitchen.. 1 capsule once daily 8)  [facility] Daily Multiple Tabs (Multiple vitamin) .Marland Kitchen.. 1 once daily 9)  Digoxin 0.125 Mg Tabs (Digoxin) .... Once daily   Patient Instructions: 1)  see me 1week    ]  ANTICOAGULATION RECORD PREVIOUS REGIMEN & LAB RESULTS Anticoagulation Diagnosis:  Atrial fibrillation on  10/18/2006 Previous INR Goal Range:  2.0-3.0 on  10/18/2006 Previous INR:  2.1 on  10/18/2006 Previous Coumadin Dose(mg):  5mg  qd on  10/18/2006 Previous Regimen:  same on  10/18/2006  NEW REGIMEN & LAB RESULTS Current INR: 4.8 Current Coumadin Dose(mg): 5mg ,2mg  alt. Regimen: 2.5;2.5,5mg   Provider: [id number]      Repeat testing in: next week MEDICATIONS ALPRAZOLAM 0.5 MG TABS (ALPRAZOLAM) Take 1 tablet by mouth once a day HYDROCHLOROTHIAZIDE 25 MG TABS (HYDROCHLOROTHIAZIDE) Take 1 tablet by mouth every morning OMEPRAZOLE 20 MG CPDR (OMEPRAZOLE) Take 1 capsule by mouth once a day VYTORIN 10-40 MG TABS (EZETIMIBE-SIMVASTATIN) Take 1 tablet by mouth once a day GNP ALLERGY RELIEF 10 MG  TBDP (LORATADINE) ???? WARFARIN SODIUM 5 MG TABS (WARFARIN SODIUM) 01/08/10/1/11/2 DILT-XR 240 MG  CP24 (DILTIAZEM HCL) 1 capsule once daily [facility] MULT[facility]   TABS (MULTIPLE VITAMIN) 1 once daily DIGOXIN 0.125 MG TABS (DIGOXIN) once daily   Anticoagulation Visit Questionnaire      Coumadin dose missed/changed:  No      Abnormal Bleeding Symptoms:  No   Any diet changes including alcohol intake, vegetables or greens since the last visit:  No Any illnesses or hospitalizations since the last visit:  No Any signs of clotting since the last visit (including chest discomfort, dizziness, shortness of breath, arm tingling, slurred speech, swelling or redness in leg):  No   Laboratory Results   Blood Tests     PT: 26.4 s   (Normal Range: 10.6-13.4)  INR: 4.8   (Normal Range: 0.88-1.12   Therap INR: 2.0-3.5) Comments:  ...................................................................Milica Zimonjic  October 24, 2006 11:16 AM

## 2010-02-09 NOTE — Assessment & Plan Note (Signed)
Summary: F/U (EVERY 6 MONTHS PER PT)//SAH   Vital Signs:  Patient Profile:   77 Years Old Female Weight:      152 pounds Temp:     97.6 degrees F oral Pulse rate:   88 / minute  Vitals Entered By: Gladis Riffle, RN (October 15, 2006 9:07 AM)                 Chief Complaint:  ROV, patient is fasting--C/O ankle swelling periodic, and last 2 days ago.  History of Present Illness:  Follow-Up Visit: htn, lipids, Genella Rife She notes some minimal lower extremeity--occurs rarely-last time was 2 days ago and was not and has not been associated with any other sxs.        This is a 77 year old woman who presents for Follow-up visit.  The patient denies chest pain, palpitations, dizziness, syncope, low blood sugar symptoms, high blood sugar symptoms, edema, SOB, DOE, PND, and orthopnea.  The patient reports taking meds as prescribed and not monitoring BP.  When questioned about possible medication side effects, the patient notes none.    Hypertension History:      She denies headache, chest pain, palpitations, dyspnea with exertion, orthopnea, PND, peripheral edema, visual symptoms, neurologic problems, syncope, and side effects from treatment.        Positive major cardiovascular risk factors include female age 75 years old or older, hyperlipidemia, and hypertension.  Negative major cardiovascular risk factors include no history of diabetes and negative family history for ischemic heart disease.        Further assessment for target organ damage reveals no history of ASHD, stroke/TIA, or peripheral vascular disease.    Lipid Management History:      Positive NCEP/ATP III risk factors include female age 75 years old or older and hypertension.  Negative NCEP/ATP III risk factors include no history of early menopause without estrogen hormone replacement, non-diabetic, no family history for ischemic heart disease, no ASHD (atherosclerotic heart disease), no prior stroke/TIA, no peripheral vascular disease,  and no history of aortic aneurysm.      Current Allergies: ! ACETYL SALICYLIC ACID (ASPIRIN)  Past Medical History:    Reviewed history from 09/23/2006 and no changes required:       Anxiety       GERD-there was a qustion of Barrett's-not documented on Bx       Hyperlipidemia       Hypertension       Mohs procedure.        Atrial fibrillation  Past Surgical History:    Reviewed history from 09/23/2006 and no changes required:       bcc-face       Hysterectomy   Family History:    Family History of Colon CA 1st degree relative <60    Family History Breast cancer 1st degree relative <50    Family History Hypertension  Social History:    Reviewed history from 09/23/2006 and no changes required:       Retired       Regular exercise-no       Married    Review of Systems       she denies all complaints in a complete ROS   Physical Exam  General:     Well-developed,well-nourished,in no acute distress; alert,appropriate and cooperative throughout examination Head:     Normocephalic and atraumatic without obvious abnormalities. No apparent alopecia or balding. Eyes:     vision grossly intact, pupils equal, and  pupils round.   Ears:     R ear normal, L ear normal, and no external deformities.   Nose:     no external deformity.   Neck:     No deformities, masses, or tenderness noted. Chest Wall:     no deformities and no tenderness.   Lungs:     Normal respiratory effort, chest expands symmetrically. Lungs are clear to auscultation, no crackles or wheezes. Heart:     irr, irr rhythm,  without gallop, murmur, click, rub or other extra sounds. Abdomen:     Bowel sounds positive,abdomen soft and non-tender without masses, organomegaly or hernias noted. Msk:     No deformity or scoliosis noted of thoracic or lumbar spine.   Pulses:     R and L carotid,radial,femoral,dorsalis pedis and posterior tibial pulses are full and equal bilaterally Extremities:     No  clubbing, cyanosis, edema, or deformity noted  Neurologic:     No cranial nerve deficits noted. Station and gait are normal.  Sensory, motor and coordinative functions appear intact. Skin:     Intact without suspicious lesions or rashes Cervical Nodes:     No lymphadenopathy noted Axillary Nodes:     No palpable lymphadenopathy Inguinal Nodes:     No significant adenopathy Psych:     Cognition and judgment appear intact. Alert and cooperative with normal attention span and concentration. No apparent delusions, illusions, hallucinations    Impression & Recommendations: lot [id number], exp June 30,2009, [employer], left deltoid IM, 0.5 cc   Problem # 2:  ATRIAL FIBRILLATION (ICD-427.31) new diagnosis : long discussion. she understands the need for treatment and anticoagulation. Understands need for further studies RVR needs rx for rate control and anticoagulation needs ECHO Her updated medication list for this problem includes:    Warfarin Sodium 5 Mg Tabs (Warfarin sodium) ..... Once daily or as directed  Orders: EKG w/ Interpretation (93000) Cardiology Referral (Cardiology) TLB-BMP (Basic Metabolic Panel-BMET) (80048-METABOL) TLB-TSH (Thyroid Stimulating Hormone) (84443-TSH) TLB-CBC Platelet - w/Differential (85025-CBCD)   Problem # 3:  HYPERTENSION (ICD-401.9)  The following medications were removed from the medication list:    Lotrel 5-10 Mg Caps (Amlodipine besy-benazepril hcl) .Marland Kitchen... Take 1 capsule by mouth once a day  Her updated medication list for this problem includes:    Hydrochlorothiazide 25 Mg Tabs (Hydrochlorothiazide) .Marland Kitchen... Take 1 tablet by mouth every morning    Diltiazem Hcl Cr 240 Mg Cp24 (Diltiazem hcl) ..... Once daily  Orders: Venipuncture (16109)  Prior BP: 160/78 (09/23/2006)  10 Yr Risk Heart Disease: Not enough information  Labs Reviewed: Creat: 1.2 (04/25/2006) Chol: 227 (04/25/2006)   HDL: 53.6 (04/25/2006)   LDL: DEL (04/25/2006)   TG:  245 (04/25/2006)   Problem # 4:  HYPERLIPIDEMIA (ICD-272.4)  Her updated medication list for this problem includes:    Vytorin 10-40 Mg Tabs (Ezetimibe-simvastatin) .Marland Kitchen... Take 1 tablet by mouth once a day  Orders: Venipuncture (60454) TLB-Lipid Panel (80061-LIPID) TLB-Hepatic/Liver Function Pnl (80076-HEPATIC)  Labs Reviewed: Chol: 227 (04/25/2006)   HDL: 53.6 (04/25/2006)   LDL: DEL (04/25/2006)   TG: 245 (04/25/2006) SGOT: 27 (04/25/2006)   SGPT: 24 (04/25/2006)  Lipid Goals: Chol Goal: 200 (10/15/2006)   HDL Goal: 40 (10/15/2006)   LDL Goal: 130 (10/15/2006)   TG Goal: 150 (10/15/2006)  10 Yr Risk Heart Disease: Not enough information   Problem # 5:  HYPERGLYCEMIA (ICD-790.29)  Orders: Venipuncture (09811) TLB-A1C / Hgb A1C (Glycohemoglobin) (83036-A1C)  Labs Reviewed: Creat: 1.2 (04/25/2006)  Complete Medication List: 1)  Alprazolam 0.5 Mg Tabs (Alprazolam) .... Take 1 tablet by mouth once a day 2)  Hydrochlorothiazide 25 Mg Tabs (Hydrochlorothiazide) .... Take 1 tablet by mouth every morning 3)  Omeprazole 20 Mg Cpdr (Omeprazole) .... Take 1 capsule by mouth once a day 4)  Vytorin 10-40 Mg Tabs (Ezetimibe-simvastatin) .... Take 1 tablet by mouth once a day 5)  Gnp Allergy Relief 10 Mg Tbdp (Loratadine) .... ???? 6)  Centrum Silver Tabs (Multiple vitamins-minerals) .... Qd 7)  Diltiazem Hcl Cr 240 Mg Cp24 (Diltiazem hcl) .... Once daily 8)  Warfarin Sodium 5 Mg Tabs (Warfarin sodium) .... Once daily or as directed  Other Orders: Flu Vaccine 31yrs + (16109) Admin 1st Vaccine (60454)  Hypertension Assessment/Plan:      The patient's hypertensive risk group is category B: At least one risk factor (excluding diabetes) with no target organ damage.    Lipid Assessment/Plan:      Based on NCEP/ATP III, the patient's risk factor category is "2 or more risk factors and a calculated 10 year CAD risk of < 20%".  From this information, the patient's calculated lipid  goals are as follows: Total cholesterol goal is 200; LDL cholesterol goal is 130; HDL cholesterol goal is 40; Triglyceride goal is 150.     Patient Instructions: 1)  atrial Fibrillation (an irregular heart beat that puts you at risk for a stroke).  2)  YOu will get a call for an ultrasound of your heart 3)  SEE ME FRIDAY.    Prescriptions: WARFARIN SODIUM 5 MG TABS (WARFARIN SODIUM) once daily or as directed  #60 x 6   Entered and Authorized by:   [name]   Signed by:   [name] on 10/15/2006   Method used:   Electronically sent to ...       [facility]  [phone]*       [address]       [place], [place]  [address]       Ph: [phone] or [phone]       Fax: [phone]   RxID:   [id number] DILTIAZEM HCL CR 240 MG CP24 (DILTIAZEM HCL) once daily  #30 x 11   Entered and Authorized by:   [name]   Signed by:   [name] on 10/15/2006   Method used:   Electronically sent to ...       [facility]  [phone]*       [address]       [place], [place]  [address]       Ph: [phone] or [phone]       Fax: [phone]   RxID:   [id number]  ]  Influenza Vaccine    Vaccine Type: Fluvax 3+    Given by: Gladis Riffle, RN  Flu Vaccine Consent Questions    Do you have a history of severe allergic reactions to this vaccine? no    Any prior history of allergic reactions to egg and/or gelatin? no    Do you have a sensitivity to the preservative Thimersol? no    Do you have a past history of Guillan-Barre Syndrome? no    Do you currently have an acute febrile illness? no    Have you ever had a severe reaction to latex? no    Vaccine information given and explained to patient? yes    Are you currently pregnant? no

## 2010-02-09 NOTE — Progress Notes (Signed)
  Phone Note Call from Patient   Caller: Patient walks in  Call For: [name] Summary of Call: Pt walks in stating she cannot afford the Lipofen.  Given #25 samples, and would like Fenobibrate next refill.  Insurance will not pay for all the chol meds stating she is on too many. Initial call taken by: Lynann Beaver CMA AAMA,  February 03, 2010 10:47 AM  Follow-up for Phone Call        ok to change fenofibrate 150 mg by mouth once daily #90/3 Follow-up by: [name],  February 03, 2010 12:27 PM

## 2010-02-09 NOTE — Progress Notes (Signed)
Summary: SAMPLES not available  Phone Note Call from Patient Call back at Home Phone [phone]   Caller: PT LIVE Call For: SWORDS Summary of Call: SHE WILL NOT GET HER VYTORIN FROM THE MAIL ORDER FOR 3 DAYS OR SO.  DOES DR SWORDS HAVE A FEW SAMPLES SHE COULD HAVE OR DOES HE WANT HER TO SKIP THOSE FEW DOSES.   Initial call taken by: Roselle Locus,  March 17, 2008 9:48 AM  Follow-up for Phone Call        we have no samples of vytorin. she will have to skip doses. Follow-up by: Sindy Guadeloupe RN,  March 17, 2008 10:04 AM

## 2010-02-09 NOTE — Assessment & Plan Note (Signed)
Summary: pt//ccm/pt rescd//ccm   Nurse Visit   Allergies: 1)  ! Acetyl Salicylic Acid (Aspirin) Laboratory Results   Blood Tests      INR: 2.8   (Normal Range: 0.88-1.12   Therap INR: 2.0-3.5) Comments: [name]  September 16, 2009 9:48 AM     Orders Added: 1)  Est. Patient Level I [99211] 2)  Protime [16109UE]   ANTICOAGULATION RECORD PREVIOUS REGIMEN & LAB RESULTS Anticoagulation Diagnosis:  Atrial fibrillation on  10/18/2006 Previous INR Goal Range:  2.0-3.0 on  10/18/2006 Previous INR:  2.5 on  08/19/2009 Previous Coumadin Dose(mg):  None only on  thursdays then 2.5mg  qd on  08/04/2009 Previous Regimen:  Same Dose on  06/02/2009 Previous Coagulation Comments:  Patient was asked to recheck her coumadin in 2 weeks but she decided to check it in 4 weeks. on  08/04/2009  NEW REGIMEN & LAB RESULTS Current INR: 2.8 Regimen: same  Repeat testing in: 4 weeks  Anticoagulation Visit Questionnaire Coumadin dose missed/changed:  No Abnormal Bleeding Symptoms:  No  Any diet changes including alcohol intake, vegetables or greens since the last visit:  No Any illnesses or hospitalizations since the last visit:  No Any signs of clotting since the last visit (including chest discomfort, dizziness, shortness of breath, arm tingling, slurred speech, swelling or redness in leg):  No  MEDICATIONS ALPRAZOLAM 0.5 MG TABS (ALPRAZOLAM) Take 1 tablet by mouth once a day OMEPRAZOLE 20 MG CPDR (OMEPRAZOLE) Take 1 capsule by mouth once a day GNP ALLERGY RELIEF 10 MG  TBDP (LORATADINE) once daily WARFARIN SODIUM 5 MG TABS (WARFARIN SODIUM) once daily as directed [facility] DA[facility] MULTIPLE   TABS (MULTIPLE VITAMIN) 1 once daily DIGOXIN 0.125 MG  TABS (DIGOXIN) Take 1 tablet by mouth once a day LISINOPRIL 20 MG TABS (LISINOPRIL) 1 tablet by mouth daily CRESTOR 20 MG TABS (ROSUVASTATIN CALCIUM) Take 1 tablet by mouth at bedtime MELOXICAM 7.5 MG  TABS (MELOXICAM) one by mouth daily as  needed OPTIVE 0.5-0.9 % SOLN (CARBOXYMETHYLCELLUL-GLYCERIN)

## 2010-02-09 NOTE — Assessment & Plan Note (Signed)
Summary: 2 month roa/jprotime/jls   Vital Signs:  Patient Profile:   77 Years Old Female Weight:      152.5 pounds Temp:     98.1 degrees F Pulse rate:   66 / minute Pulse rhythm:   irregular BP sitting:   172 / 88  (left arm)  Vitals Entered By: Gladis Riffle, RN (March 03, 2007 3:26 PM)                 Chief Complaint:  2 month rov--BP at home 130/68-186/113--requests samples vytorin as mail order not arrived yet.  History of Present Illness:  TRICUSPID REGURGITATION (ICD-397.0)-by echo, no SOB, or other sxs HYPERGLYCEMIA (ICD-790.29)-needs f/u ATRIAL FIBRILLATION (ICD-427.31)-no palpitations, SOB, neurologic deficit HYPERTENSION (ICD-401.9)-tolerating meds without difficulty HYPERLIPIDEMIA (ICD-272.4)-no sxs on meds GERD (ICD-530.81)-tolerating meds , no sxs, Chronic anticoagulation---no bleeding.   Current Meds:  ALPRAZOLAM 0.5 MG TABS (ALPRAZOLAM) Take 1 tablet by mouth once a day HYDROCHLOROTHIAZIDE 25 MG TABS (HYDROCHLOROTHIAZIDE) Take 1 tablet by mouth every morning OMEPRAZOLE 20 MG CPDR (OMEPRAZOLE) Take 1 capsule by mouth once a day VYTORIN 10-40 MG TABS (EZETIMIBE-SIMVASTATIN) Take 1 tablet by mouth once a day GNP ALLERGY RELIEF 10 MG  TBDP (LORATADINE) once daily WARFARIN SODIUM 5 MG TABS (WARFARIN SODIUM) once daily as directed METOPROLOL TARTRATE 50 MG TABS (METOPROLOL TARTRATE) 1 by mouth two times a day [facility] DAILY MULTIPLE   TABS (MULTIPLE VITAMIN) 1 once daily DIGOXIN 0.125 MG TABS (DIGOXIN) once daily * SUSTANE EYE DROPS qd  Social History: Retired Regular exercise-no Married  Family History: Family History of Colon CA 1st degree relative <60 Family History Breast cancer 1st degree relative <50 Family History Hypertension  Past Medical History: Anxiety GERD-there was a qustion of Barrett's-not documented on Bx Hyperlipidemia Hypertension Mohs procedure.  Atrial fibrillation Colon Polyps       Current Allergies (reviewed today): !  ACETYL SALICYLIC ACID (ASPIRIN)     Review of Systems       no other complaints in a complete ROS has seen dermatlogy recently---multiple cryotherapies.    Physical Exam  General:     Well-developed,well-nourished,in no acute distress; alert,appropriate and cooperative throughout examination Head:     normocephalic and atraumatic.   Eyes:     pupils equal and pupils round.   Neck:     No deformities, masses, or tenderness noted. Chest Wall:     No deformities, masses, or tenderness noted. Lungs:     Normal respiratory effort, chest expands symmetrically. Lungs are clear to auscultation, no crackles or wheezes. Heart:     Normal rate .Marland Kitchen S1 and S2 normal without gallop, murmur, click, rub or other extra sounds.irregular rhythm.   Abdomen:     Bowel sounds positive,abdomen soft and non-tender without masses, organomegaly or hernias noted. Msk:     No deformity or scoliosis noted of thoracic or lumbar spine.   Pulses:     R radial normal and L radial normal.   Neurologic:     cranial nerves II-XII intact and gait normal.   Cervical Nodes:     no anterior cervical adenopathy and no posterior cervical adenopathy.   Psych:     normally interactive and good eye contact.      Impression & Recommendations:  Problem # 1:  ATRIAL FIBRILLATION (ICD-427.31) continue current medications except adjust dose of warfarin. Her updated medication list for this problem includes:    Warfarin Sodium 5 Mg Tabs (Warfarin sodium) ..... Once daily as directed  Metoprolol Tartrate 50 Mg Tabs (Metoprolol tartrate) .Marland Kitchen... 1 by mouth two times a day    Digoxin 0.125 Mg Tabs (Digoxin) ..... Once daily  Reviewed the following: PT: 18.5 (01/30/2007)   INR: 2.3 (01/30/2007) Next Protime: 4weks (dated on 01/30/2007)  Orders: Protime (37106YI)  Reviewed the following: PT: 18.5 (01/30/2007)   INR: 4.0 (03/03/2007) Coumadin Dose (weekly): N/A (03/03/2007) Next Protime: 4 weeks (dated on  03/03/2007)   Problem # 2:  HYPERTENSION (ICD-401.9) not adequately controlled.  Start lisinopril.all side effects discussed. Her updated medication list for this problem includes:    Hydrochlorothiazide 25 Mg Tabs (Hydrochlorothiazide) .Marland Kitchen... Take 1 tablet by mouth every morning    Metoprolol Tartrate 50 Mg Tabs (Metoprolol tartrate) .Marland Kitchen... 1 by mouth two times a day    Lisinopril 20 Mg Tabs (Lisinopril) .Marland Kitchen... 1 tablet by mouth daily  BP today: 172/88 Prior BP: 145/83 (01/30/2007)  Prior 10 Yr Risk Heart Disease: Not enough information (10/15/2006)  Labs Reviewed: Creat: 1.1 (10/15/2006) Chol: 215 (10/15/2006)   HDL: 45.5 (10/15/2006)   LDL: DEL (10/15/2006)   TG: 343 (10/15/2006)   Problem # 3:  HYPERLIPIDEMIA (ICD-272.4) needs follow up laboratories. Her updated medication list for this problem includes:    Vytorin 10-40 Mg Tabs (Ezetimibe-simvastatin) .Marland Kitchen... Take 1 tablet by mouth once a day  Labs Reviewed: Chol: 215 (10/15/2006)   HDL: 45.5 (10/15/2006)   LDL: DEL (10/15/2006)   TG: 343 (10/15/2006) SGOT: 29 (10/15/2006)   SGPT: 24 (10/15/2006)  Lipid Goals: Chol Goal: 200 (10/15/2006)   HDL Goal: 40 (10/15/2006)   LDL Goal: 130 (10/15/2006)   TG Goal: 150 (10/15/2006)  Prior 10 Yr Risk Heart Disease: Not enough information (10/15/2006)  Orders: TLB-Hepatic/Liver Function Pnl (80076-HEPATIC) TLB-Lipid Panel (80061-LIPID)   Problem # 4:  TRICUSPID REGURGITATION (ICD-397.0) no sxs Her updated medication list for this problem includes:    Warfarin Sodium 5 Mg Tabs (Warfarin sodium) ..... Once daily as directed    Metoprolol Tartrate 50 Mg Tabs (Metoprolol tartrate) .Marland Kitchen... 1 by mouth two times a day   Complete Medication List: 1)  Alprazolam 0.5 Mg Tabs (Alprazolam) .... Take 1 tablet by mouth once a day 2)  Hydrochlorothiazide 25 Mg Tabs (Hydrochlorothiazide) .... Take 1 tablet by mouth every morning 3)  Omeprazole 20 Mg Cpdr (Omeprazole) .... Take 1 capsule by mouth  once a day 4)  Vytorin 10-40 Mg Tabs (Ezetimibe-simvastatin) .... Take 1 tablet by mouth once a day 5)  Gnp Allergy Relief 10 Mg Tbdp (Loratadine) .... Once daily 6)  Warfarin Sodium 5 Mg Tabs (Warfarin sodium) .... Once daily as directed 7)  Metoprolol Tartrate 50 Mg Tabs (Metoprolol tartrate) .Marland Kitchen.. 1 by mouth two times a day 8)  [facility] Daily Multiple Tabs (Multiple vitamin) .Marland Kitchen.. 1 once daily 9)  Digoxin 0.125 Mg Tabs (Digoxin) .... Once daily 10)  Sustane Eye Drops  .... Qd 11)  Lisinopril 20 Mg Tabs (Lisinopril) .Marland Kitchen.. 1 tablet by mouth daily  Other Orders: Venipuncture (94854) TLB-BMP (Basic Metabolic Panel-BMET) (80048-METABOL)   Patient Instructions: 1)  Please schedule a follow-up appointment in 1 month.    Prescriptions: LISINOPRIL 20 MG TABS (LISINOPRIL) 1 tablet by mouth daily  #90 x 3   Entered and Authorized by:   [name]   Signed by:   [name] on 03/03/2007   Method used:   Electronically [facility] to ...       [facility]  [address]  [phone]*       [address] [place], [place]  [address]       Ph: [phone] or [phone]       Fax: [phone]   RxID:   [id number]  ][CPOE-Anticoagulation-CCC]   ANTICOAGULATION RECORD PREVIOUS REGIMEN & LAB RESULTS Anticoagulation Diagnosis:  Atrial fibrillation on  10/18/2006 Previous INR Goal Range:  2.0-3.0 on  10/18/2006 Previous INR:  2.3 on  01/30/2007 Previous Coumadin Dose(mg):  2.5x5days/none on mon,thur. on  01/30/2007 Previous Regimen:  same on  01/30/2007  NEW REGIMEN & LAB RESULTS Current INR: 4.0 Regimen: None m,w,f 2.5mg  other days Coagulation Comments: OV      Repeat testing in: 4 weeks MEDICATIONS ALPRAZOLAM 0.5 MG TABS (ALPRAZOLAM) Take 1 tablet by mouth once a day HYDROCHLOROTHIAZIDE 25 MG TABS (HYDROCHLOROTHIAZIDE) Take 1 tablet by mouth every morning OMEPRAZOLE 20 MG CPDR (OMEPRAZOLE) Take 1 capsule by mouth once a day VYTORIN 10-40 MG TABS (EZETIMIBE-SIMVASTATIN) Take  1 tablet by mouth once a day GNP ALLERGY RELIEF 10 MG  TBDP (LORATADINE) once daily WARFARIN SODIUM 5 MG TABS (WARFARIN SODIUM) once daily as directed METOPROLOL TARTRATE 50 MG TABS (METOPROLOL TARTRATE) 1 by mouth two times a day [facility] DAILY MULTIPLE   TABS (MULTIPLE VITAMIN) 1 once daily DIGOXIN 0.125 MG TABS (DIGOXIN) once daily * SUSTANE EYE DROPS qd LISINOPRIL 20 MG TABS (LISINOPRIL) 1 tablet by mouth daily   Anticoagulation Visit Questionnaire      Coumadin dose missed/changed:  N/A      Abnormal Bleeding Symptoms:  No   Any diet changes including alcohol intake, vegetables or greens since the last visit:  No Any illnesses or hospitalizations since the last visit:  No Any signs of clotting since the last visit (including chest discomfort, dizziness, shortness of breath, arm tingling, slurred speech, swelling or redness in leg):  No

## 2010-02-09 NOTE — Assessment & Plan Note (Signed)
Summary: 1 mnth roa-smm   Vital Signs:  Patient Profile:   77 Years Old Female Weight:      156 pounds Temp:     98.2 degrees F oral Pulse rate:   84 / minute Pulse rhythm:   regular Resp:     12 per minute BP sitting:   122 / 78  Vitals Entered By: Lynann Beaver CMA (November 29, 2006 2:00 PM)                 History of Present Illness:  Follow-Up Visit: Atrial fibrillation---tolerating meds without dificulty, HTN, lipids She feels well.      This is a 77 year old woman who presents for Follow-up visit.  The patient denies chest pain, palpitations, dizziness, syncope, low blood sugar symptoms, high blood sugar symptoms, edema, SOB, DOE, PND, and orthopnea.  Since the last visit the patient notes no new problems or concerns.  The patient reports taking meds as prescribed.  When questioned about possible medication side effects, the patient notes none.    Current Allergies: ! ACETYL SALICYLIC ACID (ASPIRIN)  Past Medical History:    Reviewed history from 10/15/2006 and no changes required:       Anxiety       GERD-there was a qustion of Barrett's-not documented on Bx       Hyperlipidemia       Hypertension       Mohs procedure.        Atrial fibrillation  Past Surgical History:    Reviewed history from 09/23/2006 and no changes required:       bcc-face       Hysterectomy   Family History:    Reviewed history from 10/15/2006 and no changes required:       Family History of Colon CA 1st degree relative <60       Family History Breast cancer 1st degree relative <50       Family History Hypertension  Social History:    Reviewed history from 09/23/2006 and no changes required:       Retired       Regular exercise-no       Married    Review of Systems       no other complaints in a complete ROS    Physical Exam  General:     Well-developed,well-nourished,in no acute distress; alert,appropriate and cooperative throughout examination Head:     Normocephalic  and atraumatic without obvious abnormalities. No apparent alopecia or balding. Eyes:     vision grossly intact and pupils equal.   Neck:     No deformities, masses, or tenderness noted. Chest Wall:     No deformities, masses, or tenderness noted. Lungs:     Normal respiratory effort, chest expands symmetrically. Lungs are clear to auscultation, no crackles or wheezes. Heart:     no murmur, no gallop, no rub, no JVD, no HJR, and irregular rhythm.   Abdomen:     Bowel sounds positive,abdomen soft and non-tender without masses, organomegaly or hernias noted. Msk:     No deformity or scoliosis noted of thoracic or lumbar spine.   Pulses:     R and L carotid,radial,femoral,dorsalis pedis and posterior tibial pulses are full and equal bilaterally Neurologic:     No cranial nerve deficits noted. Station and gait are normal.  Sensory, motor and coordinative functions appear intact. Skin:     Intact without  rashes---she has a large lesion posterior--distal calf-raised, scaley Psych:  Cognition and judgment appear intact. Alert and cooperative with normal attention span and concentration. No apparent delusions, illusions, hallucinations    Impression & Recommendations:  Problem # 1:  ATRIAL FIBRILLATION (ICD-427.31) rate controlled Her updated medication list for this problem includes:    Warfarin Sodium 5 Mg Tabs (Warfarin sodium) .Marland Kitchen... 1/2 5x weekly    Metoprolol Tartrate 50 Mg Tabs (Metoprolol tartrate) .Marland Kitchen... 1 by mouth two times a day    Digoxin 0.125 Mg Tabs (Digoxin) ..... Once daily  Orders: Protime (16109UE) Fingerstick (45409) Cardiology Referral (Cardiology) Protime [phone]) Fingerstick [phone])   Problem # 2:  HYPERTENSION (ICD-401.9)  Her updated medication list for this problem includes:    Hydrochlorothiazide 25 Mg Tabs (Hydrochlorothiazide) .Marland Kitchen... Take 1 tablet by mouth every morning    Metoprolol Tartrate 50 Mg Tabs (Metoprolol tartrate) .Marland Kitchen... 1 by mouth two  times a day  BP today: 122/78 Prior BP: 136/79 (11/13/2006)  Prior 10 Yr Risk Heart Disease: Not enough information (10/15/2006)  Labs Reviewed: Creat: 1.1 (10/15/2006) Chol: 215 (10/15/2006)   HDL: 45.5 (10/15/2006)   LDL: DEL (10/15/2006)   TG: 343 (10/15/2006)  Orders: Cardiology Referral (Cardiology)   Problem # 3:  HYPERLIPIDEMIA (ICD-272.4) continue medications Her updated medication list for this problem includes:    Vytorin 10-40 Mg Tabs (Ezetimibe-simvastatin) .Marland Kitchen... Take 1 tablet by mouth once a day  Labs Reviewed: Chol: 215 (10/15/2006)   HDL: 45.5 (10/15/2006)   LDL: DEL (10/15/2006)   TG: 343 (10/15/2006) SGOT: 29 (10/15/2006)   SGPT: 24 (10/15/2006)  Lipid Goals: Chol Goal: 200 (10/15/2006)   HDL Goal: 40 (10/15/2006)   LDL Goal: 130 (10/15/2006)   TG Goal: 150 (10/15/2006)  Prior 10 Yr Risk Heart Disease: Not enough information (10/15/2006)  Orders: Cardiology Referral (Cardiology)   Complete Medication List: 1)  Alprazolam 0.5 Mg Tabs (Alprazolam) .... Take 1 tablet by mouth once a day 2)  Hydrochlorothiazide 25 Mg Tabs (Hydrochlorothiazide) .... Take 1 tablet by mouth every morning 3)  Omeprazole 20 Mg Cpdr (Omeprazole) .... Take 1 capsule by mouth once a day 4)  Vytorin 10-40 Mg Tabs (Ezetimibe-simvastatin) .... Take 1 tablet by mouth once a day 5)  Gnp Allergy Relief 10 Mg Tbdp (Loratadine) .... ???? 6)  Warfarin Sodium 5 Mg Tabs (Warfarin sodium) .... 1/2 5x weekly 7)  Metoprolol Tartrate 50 Mg Tabs (Metoprolol tartrate) .Marland Kitchen.. 1 by mouth two times a day 8)  [facility] Daily Multiple Tabs (Multiple vitamin) .Marland Kitchen.. 1 once daily 9)  Digoxin 0.125 Mg Tabs (Digoxin) .... Once daily   Patient Instructions: 1)  you need to have your blood checked at least every month while you are on warfarin (coumadin) 2)  Please schedule a follow-up appointment in 1 month.    Prescriptions: METOPROLOL TARTRATE 50 MG TABS (METOPROLOL TARTRATE) 1 by mouth two times a day  #60 x  6   Entered and Authorized by:   [name]   Signed by:   [name] on 11/29/2006   Method used:   Electronically sent to ...       [facility]  [phone]*       [address]       [place], [place]  [address]       Ph: [phone] or [phone]       Fax: [phone]   RxID:   [id number]  ]  ANTICOAGULATION RECORD PREVIOUS REGIMEN & LAB RESULTS Anticoagulation Diagnosis:  Atrial fibrillation on  10/18/2006 Previous INR  Goal Range:  2.0-3.0 on  10/18/2006 Previous INR:  2.9 on  11/13/2006 Previous Coumadin Dose(mg):  2.5X5 DAYS/NONE-MON,TH on  11/13/2006 Previous Regimen:  SAME on  11/13/2006  NEW REGIMEN & LAB RESULTS Current INR: 1.9 Current Coumadin Dose(mg): 2.5x5days,none-mon,th Regimen: 2.5x6days/none -thu  Provider: [id number]      Repeat testing in: 2month MEDICATIONS ALPRAZOLAM 0.5 MG TABS (ALPRAZOLAM) Take 1 tablet by mouth once a day HYDROCHLOROTHIAZIDE 25 MG TABS (HYDROCHLOROTHIAZIDE) Take 1 tablet by mouth every morning OMEPRAZOLE 20 MG CPDR (OMEPRAZOLE) Take 1 capsule by mouth once a day VYTORIN 10-40 MG TABS (EZETIMIBE-SIMVASTATIN) Take 1 tablet by mouth once a day GNP ALLERGY RELIEF 10 MG  TBDP (LORATADINE) ???? WARFARIN SODIUM 5 MG TABS (WARFARIN SODIUM) 1/2 5x weekly METOPROLOL TARTRATE 50 MG TABS (METOPROLOL TARTRATE) 1 by mouth two times a day [facility] DAILY MULTIPLE   TABS (MULTIPLE VITAMIN) 1 once daily DIGOXIN 0.125 MG TABS (DIGOXIN) once daily   Anticoagulation Visit Questionnaire      Coumadin dose missed/changed:  No      Abnormal Bleeding Symptoms:  No   Any diet changes including alcohol intake, vegetables or greens since the last visit:  No Any illnesses or hospitalizations since the last visit:  No Any signs of clotting since the last visit (including chest discomfort, dizziness, shortness of breath, arm tingling, slurred speech, swelling or redness in leg):  No   Laboratory Results   Blood Tests     PT:  17.0 s   (Normal Range: 10.6-13.4)  INR: 1.9   (Normal Range: 0.88-1.12   Therap INR: 2.0-3.5) Comments: ...................................................................Milica Zimonjic  November 29, 2006 2:56 PM       Vital Signs:  Patient Profile:   77 Years Old Female Weight:      156 pounds Temp:     98.2 degrees F oral Pulse rate:   84 / minute Pulse rhythm:   regular Resp:     12 per minute BP sitting:   122 / 78

## 2010-02-09 NOTE — Assessment & Plan Note (Signed)
Summary: pt/mm   Nurse Visit   Allergies: 1)  ! Acetyl Salicylic Acid (Aspirin) Laboratory Results   Blood Tests     PT: 22.5 s   (Normal Range: 10.6-13.4)  INR: 3.5   (Normal Range: 0.88-1.12   Therap INR: 2.0-3.5) Comments: [name]  January 26, 2009 10:50 AM     Orders Added: 1)  Est. Patient Level I [99211] 2)  Protime [16109UE]   ANTICOAGULATION RECORD PREVIOUS REGIMEN & LAB RESULTS Anticoagulation Diagnosis:  Atrial fibrillation on  10/18/2006 Previous INR Goal Range:  2.0-3.0 on  10/18/2006 Previous INR:  2.8 on  12/29/2008 Previous Coumadin Dose(mg):  none Mon. & Thurs others 2.5mg  on  06/21/2008 Previous Regimen:  same on  12/29/2008 Previous Coagulation Comments:  Approved by Dr. Lovell Sheehan. on  10/22/2007  NEW REGIMEN & LAB RESULTS Current INR: 3.5 Regimen: hold 1 day then resume Coagulation Comments: Dr Lovell Sheehan approved Repeat testing in: 1 month  Anticoagulation Visit Questionnaire Coumadin dose missed/changed:  No Abnormal Bleeding Symptoms:  No  Any diet changes including alcohol intake, vegetables or greens since the last visit:  No Any illnesses or hospitalizations since the last visit:  No Any signs of clotting since the last visit (including chest discomfort, dizziness, shortness of breath, arm tingling, slurred speech, swelling or redness in leg):  Yes  MEDICATIONS ALPRAZOLAM 0.5 MG TABS (ALPRAZOLAM) Take 1 tablet by mouth once a day HYDROCHLOROTHIAZIDE 25 MG TABS (HYDROCHLOROTHIAZIDE) Take 1 tablet by mouth every morning OMEPRAZOLE 20 MG CPDR (OMEPRAZOLE) Take 1 capsule by mouth once a day GNP ALLERGY RELIEF 10 MG  TBDP (LORATADINE) once daily WARFARIN SODIUM 5 MG TABS (WARFARIN SODIUM) once daily as directed [facility] DAILY MULTIPLE   TABS (MULTIPLE VITAMIN) 1 once daily DIGOXIN 0.125 MG  TABS (DIGOXIN) Take 1 tablet by mouth once a day SYSTANE NIGHTTIME  OINT (WHITE PETROLATUM-MINERAL OIL) once daily as directed LISINOPRIL 20 MG TABS  (LISINOPRIL) 1 tablet by mouth daily SIMVASTATIN 40 MG TABS (SIMVASTATIN) Take one tablet at bedtime

## 2010-02-09 NOTE — Assessment & Plan Note (Signed)
Summary: FOOT PAIN / GOUT? // RS   Vital Signs:  Patient profile:   77 year old female Weight:      135 pounds Temp:     97.8 degrees F oral Pulse rate:   100 / minute Pulse rhythm:   regular BP sitting:   162 / 84  (left arm) Cuff size:   regular  Vitals Entered By: Alfred Levins, CMA (December 16, 2009 10:48 AM) CC: gout lt foot   CC:  gout lt foot.  History of Present Illness: foot pain told had fracture from foot doctor---treated with boot hx is much more consistent with gout---sxs relieved with son's colchicine.   Current Medications (verified): 1)  Alprazolam 0.5 Mg Tabs (Alprazolam) .... Take 1 Tablet By Mouth Once A Day 2)  Omeprazole 20 Mg Cpdr (Omeprazole) .... Take 1 Capsule By Mouth Once A Day 3)  Gnp Allergy Relief 10 Mg  Tbdp (Loratadine) .... Once Daily 4)  Warfarin Sodium 5 Mg Tabs (Warfarin Sodium) .... Once Daily As Directed 5)  Cvs Daily Multiple   Tabs (Multiple Vitamin) .Marland Kitchen.. 1 Once Daily 6)  Digoxin 0.125 Mg  Tabs (Digoxin) .... Take 1 Tablet By Mouth Once A Day 7)  Lisinopril 20 Mg Tabs (Lisinopril) .Marland Kitchen.. 1 Tablet By Mouth Daily 8)  Crestor 40 Mg Tabs (Rosuvastatin Calcium) .... 1/2 By Mouth Once Daily 9)  Optive 0.5-0.9 % Soln (Carboxymethylcellul-Glycerin) 10)  Colcrys 0.6 Mg Tabs (Colchicine) .... For 5 Days  Allergies (verified): 1)  ! Acetyl Salicylic Acid (Aspirin)  Past History:  Past Medical History: Last updated: 02/18/2007 Anxiety GERD-there was a qustion of Barrett's-not documented on Bx Hyperlipidemia Hypertension Mohs procedure.  Atrial fibrillation Colon Polyps  Past Surgical History: Last updated: 04/04/2009 bcc-face Hysterectomy Cataract extraction--left eye  Family History: Last updated: 10/15/2006 Family History of Colon CA 1st degree relative <60 Family History Breast cancer 1st degree relative <50 Family History Hypertension  Social History: Last updated: 09/23/2006 Retired Regular exercise-no Married  Risk  Factors: Exercise: no (08/19/2009)  Risk Factors: Smoking Status: never (08/19/2009)  Physical Exam  General:  alert and well-developed.   Head:  normocephalic and atraumatic.   Eyes:  pupils equal and pupils round.   Ears:  R ear normal and L ear normal.   Neck:  cervical spine tenderness Heart:  normal rate and irregular rhythm.   Abdomen:  soft.   Skin:  turgor normal and color normal.     Impression & Recommendations:  Problem # 1:  GOUT (ICD-274.9) she has gout, I doubt that she ever had a foot fracture that was the cause of her sxs.  she started allopurinol a few days ago Her updated medication list for this problem includes:    Colcrys 0.6 Mg Tabs (Colchicine) .Marland Kitchen... 1 three times a day for acute gout flare    Allopurinol 300 Mg Tabs (Allopurinol) .Marland Kitchen... Take 1 tab every day  Complete Medication List: 1)  Alprazolam 0.5 Mg Tabs (Alprazolam) .... Take 1 tablet by mouth once a day 2)  Omeprazole 20 Mg Cpdr (Omeprazole) .... Take 1 capsule by mouth once a day 3)  Gnp Allergy Relief 10 Mg Tbdp (Loratadine) .... Once daily 4)  Warfarin Sodium 5 Mg Tabs (Warfarin sodium) .... Once daily as directed 5)  Cvs Daily Multiple Tabs (Multiple vitamin) .Marland Kitchen.. 1 once daily 6)  Digoxin 0.125 Mg Tabs (Digoxin) .... Take 1 tablet by mouth once a day 7)  Lisinopril 20 Mg Tabs (Lisinopril) .Marland Kitchen.. 1 tablet  by mouth daily 8)  Crestor 40 Mg Tabs (Rosuvastatin calcium) .... 1/2 by mouth once daily 9)  Optive 0.5-0.9 % Soln (Carboxymethylcellul-glycerin) 10)  Colcrys 0.6 Mg Tabs (Colchicine) .Marland Kitchen.. 1 three times a day for acute gout flare 11)  Allopurinol 300 Mg Tabs (Allopurinol) .... Take 1 tab every day  Patient Instructions: 1)  protime 2 weeks  2)  see me 6 weeks from now (cancel all other appts) Prescriptions: COLCRYS 0.6 MG TABS (COLCHICINE) 1 three times a day for acute gout flare  #40 x 1   Entered and Authorized by:   [name]   Signed by:   [name] on 12/16/2009    Method used:   Electronically to        [facility]  [phone]* (retail)       [address] [place], [place]  [address]       Ph: [phone] or [phone]       Fax: [phone]   RxID:   [id number] ALLOPURINOL 300 MG TABS (ALLOPURINOL) Take 1 tab every day  #90 x 3   Entered and Authorized by:   [name]   Signed by:   [name] on 12/16/2009   Method used:   Electronically to        [facility]  [phone]* (retail)       [address] [place], [place]  [address]       Ph: [phone] or [phone]       Fax: [phone]   RxID:   [id number]    Orders Added: 1)  Est. Patient Level III [06269]

## 2010-02-09 NOTE — Assessment & Plan Note (Signed)
Summary: discuss gout/et   Vital Signs:  Patient Profile:   77 Years Old Female Pulse rate:   80 / minute Resp:     12 per minute BP sitting:   148 / 80  (left arm)  Vitals Entered By: Gladis Riffle, RN (February 10, 2008 10:50 AM)                 Chief Complaint:  discuss gout right great toe since 1/31--had fourindomethacine he took and asking about allopurinol.    Prior Medications Reviewed Using: List Brought by Patient  Prior Medication List:  ALPRAZOLAM 0.5 MG TABS (ALPRAZOLAM) Take 1 tablet by mouth once a day HYDROCHLOROTHIAZIDE 25 MG TABS (HYDROCHLOROTHIAZIDE) Take 1 tablet by mouth every morning OMEPRAZOLE 20 MG CPDR (OMEPRAZOLE) Take 1 capsule by mouth once a day VYTORIN 10-40 MG TABS (EZETIMIBE-SIMVASTATIN) Take 1 tablet by mouth once a day GNP ALLERGY RELIEF 10 MG  TBDP (LORATADINE) once daily WARFARIN SODIUM 5 MG TABS (WARFARIN SODIUM) once daily as directed [facility] DAILY MULTIPLE   TABS (MULTIPLE VITAMIN) 1 once daily DIGOXIN 0.125 MG  TABS (DIGOXIN) Take 1 tablet by mouth once a day SYSTANE NIGHTTIME  OINT (WHITE PETROLATUM-MINERAL OIL) once daily as directed LISINOPRIL 20 MG TABS (LISINOPRIL) 1 tablet by mouth daily   Current Allergies (reviewed today): ! ACETYL SALICYLIC ACID (ASPIRIN)        Impression & Recommendations:  Problem # 1:  GOUT (ICD-274.9) recurrent episodes discussed for 20 minutes trial allopurinol Her updated medication list for this problem includes:    Indomethacin 50 Mg Caps (Indomethacin) .Marland Kitchen... Take 1 tablet by mouth two times a day as needed for gout    Allopurinol 300 Mg Tabs (Allopurinol) .Marland Kitchen... Take 1 tab every day   Complete Medication List: 1)  Alprazolam 0.5 Mg Tabs (Alprazolam) .... Take 1 tablet by mouth once a day 2)  Hydrochlorothiazide 25 Mg Tabs (Hydrochlorothiazide) .... Take 1 tablet by mouth every morning 3)  Omeprazole 20 Mg Cpdr (Omeprazole) .... Take 1 capsule by mouth once a day 4)  Vytorin 10-40 Mg Tabs  (Ezetimibe-simvastatin) .... Take 1 tablet by mouth once a day 5)  Gnp Allergy Relief 10 Mg Tbdp (Loratadine) .... Once daily 6)  Warfarin Sodium 5 Mg Tabs (Warfarin sodium) .... Once daily as directed 7)  [facility] Daily Multiple Tabs (Multiple vitamin) .Marland Kitchen.. 1 once daily 8)  Digoxin 0.125 Mg Tabs (Digoxin) .... Take 1 tablet by mouth once a day 9)  Systane Nighttime Oint (White petrolatum-mineral oil) .... Once daily as directed 10)  Lisinopril 20 Mg Tabs (Lisinopril) .Marland Kitchen.. 1 tablet by mouth daily 11)  Indomethacin 50 Mg Caps (Indomethacin) .... Take 1 tablet by mouth two times a day as needed for gout 12)  Allopurinol 300 Mg Tabs (Allopurinol) .... Take 1 tab every day   Patient Instructions: 1)  indomethacin instructions: take one daily for 5 days to prevent gout flare up while starting allopurinol   Prescriptions: ALLOPURINOL 300 MG TABS (ALLOPURINOL) Take 1 tab every day  #90 x 3   Entered and Authorized by:   [name]   Signed by:   [name] on 02/10/2008   Method used:   Electronically to        [facility]  [phone]* (retail)       [address] [place], [place]  [address]       Ph: [phone] or [phone]  Fax: [phone]   RxID:   [id number] INDOMETHACIN 50 MG CAPS (INDOMETHACIN) Take 1 tablet by mouth two times a day as needed for gout  #20 x 0   Entered and Authorized by:   [name]   Signed by:   [name] on 02/10/2008   Method used:   Electronically to        [facility]  [phone]* (retail)       [address] [place], [place]  [address]       Ph: [phone] or [phone]       Fax: [phone]   RxID:   [id number]

## 2010-02-09 NOTE — Assessment & Plan Note (Signed)
Summary: 1 month rov, fasting for lipid (272.4)/et/PT RESCD/CCM   Vital Signs:  Patient Profile:   77 Years Old Female Weight:      141 pounds Temp:     97.9 degrees F Pulse rate:   64 / minute BP sitting:   148 / 96  (left arm)  Vitals Entered By: Gladis Riffle, RN (April 01, 2007 9:59 AM)                 Chief Complaint:  1 month rov and fasting and needs pro time.  History of Present Illness:   TRICUSPID REGURGITATION (ICD-397.0)-by echo HYPERGLYCEMIA (ICD-790.29)--no sxs---needs followup ATRIAL FIBRILLATION (ICD-427.31)-chronic--regular protime check HYPERTENSION (ICD-401.9)-no sxs...home bps 107-138/58-91 HYPERLIPIDEMIA (ICD-272.4)--tolerating meds without difficulty GERD (ICD-530.81)--no sxs on meds  Past Medical History: Anxiety GERD-there was a qustion of Barrett's-not documented on Bx Hyperlipidemia Hypertension Mohs procedure.  Atrial fibrillation Colon Polyps  Current Meds:  ALPRAZOLAM 0.5 MG TABS (ALPRAZOLAM) Take 1 tablet by mouth once a day HYDROCHLOROTHIAZIDE 25 MG TABS (HYDROCHLOROTHIAZIDE) Take 1 tablet by mouth every morning OMEPRAZOLE 20 MG CPDR (OMEPRAZOLE) Take 1 capsule by mouth once a day VYTORIN 10-40 MG TABS (EZETIMIBE-SIMVASTATIN) Take 1 tablet by mouth once a day GNP ALLERGY RELIEF 10 MG  TBDP (LORATADINE) once daily WARFARIN SODIUM 5 MG TABS (WARFARIN SODIUM) once daily as directed METOPROLOL TARTRATE 50 MG TABS (METOPROLOL TARTRATE) 1 by mouth two times a day [facility] DAILY MULTIPLE   TABS (MULTIPLE VITAMIN) 1 once daily DIGOXIN 0.125 MG TABS (DIGOXIN) once daily * SUSTANE EYE DROPS qd LISINOPRIL 20 MG TABS (LISINOPRIL) 1 tablet by mouth daily  Social History: Retired Regular exercise-no Married  Family History: Family History of Colon CA 1st degree relative <60 Family History Breast cancer 1st degree relative <50 Family History Hypertension        Current Allergies (reviewed today): ! ACETYL SALICYLIC ACID  (ASPIRIN)     Review of Systems       no other complaints in a complete ROS    Physical Exam  General:     Well-developed,well-nourished,in no acute distress; alert,appropriate and cooperative throughout examination Head:     normocephalic and atraumatic.   Eyes:     pupils equal and pupils round.   Ears:     R ear normal, L ear normal, and no external deformities.   Nose:     no external deformity.   Neck:     No deformities, masses, or tenderness noted. Chest Wall:     No deformities, masses, or tenderness noted. Lungs:     Normal respiratory effort, chest expands symmetrically. Lungs are clear to auscultation, no crackles or wheezes. Heart:     Normal rate .Marland Kitchen S1 and S2 normal without gallop, murmur, click, rub or other extra sounds.irregular rhythm.   Abdomen:     Bowel sounds positive,abdomen soft and non-tender without masses, organomegaly or hernias noted. Pulses:     R radial normal and L radial normal.   Extremities:     No clubbing, cyanosis, edema, or deformity noted  Neurologic:     cranial nerves II-XII intact and gait normal.   Cervical Nodes:     no anterior cervical adenopathy and no posterior cervical adenopathy.   Psych:     normally interactive and good eye contact.      Impression & Recommendations:  Problem # 1:  HYPERGLYCEMIA (ICD-790.29) will recheck labs today Labs Reviewed: HgBA1c: 6.1 (10/15/2006)   Creat: 0.9 (03/03/2007)     Orders: TLB-A1C /  Hgb A1C (Glycohemoglobin) (83036-A1C)   Problem # 2:  ATRIAL FIBRILLATION (ICD-427.31) asymptomatic.  No bleeding complications.  Continue current medications. Her updated medication list for this problem includes:    Warfarin Sodium 5 Mg Tabs (Warfarin sodium) ..... Once daily as directed    Metoprolol Tartrate 50 Mg Tabs (Metoprolol tartrate) .Marland Kitchen... 1 by mouth two times a day    Digoxin 0.125 Mg Tabs (Digoxin) ..... Once daily  Reviewed the following: PT: 18.5 (01/30/2007)   INR: 4.0  (03/03/2007) Coumadin Dose (weekly): N/A (03/03/2007) Prior Coumadin Dose (weekly): N/A (03/03/2007) Next Protime: 4 weeks (dated on 03/03/2007)  Orders: Protime (04540JW) Venipuncture (11914)   Problem # 3:  HYPERTENSION (ICD-401.9) fair control at home Her updated medication list for this problem includes:    Hydrochlorothiazide 25 Mg Tabs (Hydrochlorothiazide) .Marland Kitchen... Take 1 tablet by mouth every morning    Metoprolol Tartrate 50 Mg Tabs (Metoprolol tartrate) .Marland Kitchen... 1 by mouth two times a day    Lisinopril 20 Mg Tabs (Lisinopril) .Marland Kitchen... 1 tablet by mouth daily  BP today: 148/96 Prior BP: 172/88 (03/03/2007)  Prior 10 Yr Risk Heart Disease: Not enough information (10/15/2006)  Labs Reviewed: Creat: 0.9 (03/03/2007) Chol: 179 (03/03/2007)   HDL: 33.0 (03/03/2007)   LDL: DEL (03/03/2007)   TG: 685 (03/03/2007)  Orders: TLB-BMP (Basic Metabolic Panel-BMET) (80048-METABOL)   Problem # 4:  HYPERLIPIDEMIA (ICD-272.4) continue current medications. Her updated medication list for this problem includes:    Vytorin 10-40 Mg Tabs (Ezetimibe-simvastatin) .Marland Kitchen... Take 1 tablet by mouth once a day  Labs Reviewed: Chol: 179 (03/03/2007)   HDL: 33.0 (03/03/2007)   LDL: DEL (03/03/2007)   TG: 685 (03/03/2007) SGOT: 35 (03/03/2007)   SGPT: 30 (03/03/2007)  Lipid Goals: Chol Goal: 200 (10/15/2006)   HDL Goal: 40 (10/15/2006)   LDL Goal: 130 (10/15/2006)   TG Goal: 150 (10/15/2006)  Prior 10 Yr Risk Heart Disease: Not enough information (10/15/2006)  Orders: TLB-Lipid Panel (80061-LIPID) TLB-Hepatic/Liver Function Pnl (80076-HEPATIC)   Problem # 5:  GERD (ICD-530.81)  Her updated medication list for this problem includes:    Omeprazole 20 Mg Cpdr (Omeprazole) .Marland Kitchen... Take 1 capsule by mouth once a day  Diagnostics Reviewed:  Discussed lifestyle modifications, diet, antacids/medications, and preventive measures. Handout provided.   Complete Medication List: 1)  Alprazolam 0.5 Mg Tabs  (Alprazolam) .... Take 1 tablet by mouth once a day 2)  Hydrochlorothiazide 25 Mg Tabs (Hydrochlorothiazide) .... Take 1 tablet by mouth every morning 3)  Omeprazole 20 Mg Cpdr (Omeprazole) .... Take 1 capsule by mouth once a day 4)  Vytorin 10-40 Mg Tabs (Ezetimibe-simvastatin) .... Take 1 tablet by mouth once a day 5)  Gnp Allergy Relief 10 Mg Tbdp (Loratadine) .... Once daily 6)  Warfarin Sodium 5 Mg Tabs (Warfarin sodium) .... Once daily as directed 7)  Metoprolol Tartrate 50 Mg Tabs (Metoprolol tartrate) .Marland Kitchen.. 1 by mouth two times a day 8)  [facility]vs Daily Multiple Tabs (Multiple vitamin) .Marland Kitchen.. 1 once daily 9)  Digoxin 0.125 Mg Tabs (Digoxin) .... Once daily 10)  Sustane Eye Drops  .... Qd 11)  Lisinopril 20 Mg Tabs (Lisinopril) .Marland Kitchen.. 1 tablet by mouth daily   Patient Instructions: 1)  Please schedule a follow-up appointment in 3 months.    ]  ANTICOAGULATION RECORD PREVIOUS REGIMEN & LAB RESULTS Anticoagulation Diagnosis:  Atrial fibrillation on  10/18/2006 Previous INR Goal Range:  2.0-3.0 on  10/18/2006 Previous INR:  4.0 on  03/03/2007 Previous Coumadin Dose(mg):  2.5x5days/none on mon,thur. on  01/30/2007 Previous Regimen:  None m,w,f 2.5mg  other days on  03/03/2007 Previous Coagulation Comments:  OV on  03/03/2007  NEW REGIMEN & LAB RESULTS Current INR: 1.4 Regimen: NONE ON TUE,THU, 2.5MG  OTHER DAYS Coagulation Comments: OV      Repeat testing in: 2 WEEKS MEDICATIONS ALPRAZOLAM 0.5 MG TABS (ALPRAZOLAM) Take 1 tablet by mouth once a day HYDROCHLOROTHIAZIDE 25 MG TABS (HYDROCHLOROTHIAZIDE) Take 1 tablet by mouth every morning OMEPRAZOLE 20 MG CPDR (OMEPRAZOLE) Take 1 capsule by mouth once a day VYTORIN 10-40 MG TABS (EZETIMIBE-SIMVASTATIN) Take 1 tablet by mouth once a day GNP ALLERGY RELIEF 10 MG  TBDP (LORATADINE) once daily WARFARIN SODIUM 5 MG TABS (WARFARIN SODIUM) once daily as directed METOPROLOL TARTRATE 50 MG TABS (METOPROLOL TARTRATE) 1 by mouth two times a day [facility]  DAILY MULTIPLE   TABS (MULTIPLE VITAMIN) 1 once daily DIGOXIN 0.125 MG TABS (DIGOXIN) once daily * SUSTANE EYE DROPS qd LISINOPRIL 20 MG TABS (LISINOPRIL) 1 tablet by mouth daily   Anticoagulation Visit Questionnaire      Coumadin dose missed/changed:  No      Abnormal Bleeding Symptoms:  No   Any diet changes including alcohol intake, vegetables or greens since the last visit:  No Any illnesses or hospitalizations since the last visit:  No Any signs of clotting since the last visit (including chest discomfort, dizziness, shortness of breath, arm tingling, slurred speech, swelling or redness in leg):  No   Laboratory Results   Blood Tests    Date/Time Reported: April 01, 2007 11:23 AM   PT: 14.7 s   (Normal Range: 10.6-13.4)  INR: 1.4   (Normal Range: 0.88-1.12   Therap INR: 2.0-3.5) Comments: ..................................................................Marland KitchenWynona Canes, CMA  April 01, 2007 11:24 AM

## 2010-02-09 NOTE — Assessment & Plan Note (Signed)
Summary: painful foot/nn   Vital Signs:  Patient Profile:   77 Years Old Female Temp:     98 degrees F oral Pulse rate:   80 / minute Pulse rhythm:   irregular Resp:     14 per minute BP sitting:   180 / 98  (left arm)  Vitals Entered By: Gladis Riffle, RN (December 24, 2007 8:39 AM)                 Chief Complaint:  painful foot.  History of Present Illness: 2 day hx of right great toe pain at mtp joint red painful 5/10 no trauma no associated sxs pain disrupts sleep   Past Medical History: Anxiety GERD-there was a qustion of Barrett's-not documented on Bx Hyperlipidemia Hypertension Mohs procedure.  Atrial fibrillation Colon Polyps  Past Surgical History: bcc-face Hysterectomy  Social History: Retired Regular exercise-no Married  Family History: Family History of Colon CA 1st degree relative <60 Family History Breast cancer 1st degree relative <50 Family History Hypertension  no other complaints in a complete ROS     Prior Medication List:  ALPRAZOLAM 0.5 MG TABS (ALPRAZOLAM) Take 1 tablet by mouth once a day HYDROCHLOROTHIAZIDE 25 MG TABS (HYDROCHLOROTHIAZIDE) Take 1 tablet by mouth every morning OMEPRAZOLE 20 MG CPDR (OMEPRAZOLE) Take 1 capsule by mouth once a day VYTORIN 10-40 MG TABS (EZETIMIBE-SIMVASTATIN) Take 1 tablet by mouth once a day GNP ALLERGY RELIEF 10 MG  TBDP (LORATADINE) once daily WARFARIN SODIUM 5 MG TABS (WARFARIN SODIUM) once daily as directed [facility] DAILY MULTIPLE   TABS (MULTIPLE VITAMIN) 1 once daily DIGOXIN 0.125 MG  TABS (DIGOXIN) Take 1 tablet by mouth once a day SYSTANE NIGHTTIME  OINT (WHITE PETROLATUM-MINERAL OIL) once daily as directed LISINOPRIL 20 MG TABS (LISINOPRIL) 1 tablet by mouth daily   Current Allergies (reviewed today): ! ACETYL SALICYLIC ACID (ASPIRIN)      Physical Exam  General:     Well-developed,well-nourished,in no acute distress; alert,appropriate and cooperative throughout examination Head:  Normocephalic and atraumatic without obvious abnormalities. No apparent alopecia or balding. Eyes:     pupils equal and pupils round.   Ears:     R ear normal and L ear normal.   Neck:     No deformities, masses, or tenderness noted. Lungs:     Normal respiratory effort, chest expands symmetrically. Lungs are clear to auscultation, no crackles or wheezes. Heart:     controlled ventricular response, irr irr rate Msk:     1st mtp joint right red and swollen. pain with any movement or palpation    Impression & Recommendations:  Problem # 1:  GOUT (ICD-274.9) presumptive dx based on clinical findings trial NSAID, risks and side effects discussed in light of her other meds and medical problems Her updated medication list for this problem includes:    Indomethacin 50 Mg Caps (Indomethacin) .Marland Kitchen... Take 1 tablet by mouth two times a day with food discussed with patient and daughter call if sxs persist   Problem # 2:  ATRIAL FIBRILLATION (ICD-427.31) protime today. Continue monthly protimes. Her updated medication list for this problem includes:    Warfarin Sodium 5 Mg Tabs (Warfarin sodium) ..... Once daily as directed    Digoxin 0.125 Mg Tabs (Digoxin) .Marland Kitchen... Take 1 tablet by mouth once a day  Orders: Protime ([id number]) Fingerstick (78469)   Complete Medication List: 1)  Alprazolam 0.5 Mg Tabs (Alprazolam) .... Take 1 tablet by mouth once a day 2)  Hydrochlorothiazide 25 Mg Tabs (  Hydrochlorothiazide) .... Take 1 tablet by mouth every morning 3)  Omeprazole 20 Mg Cpdr (Omeprazole) .... Take 1 capsule by mouth once a day 4)  Vytorin 10-40 Mg Tabs (Ezetimibe-simvastatin) .... Take 1 tablet by mouth once a day 5)  Gnp Allergy Relief 10 Mg Tbdp (Loratadine) .... Once daily 6)  Warfarin Sodium 5 Mg Tabs (Warfarin sodium) .... Once daily as directed 7)  [facility]vs Daily Multiple Tabs (Multiple vitamin) .Marland Kitchen.. 1 once daily 8)  Digoxin 0.125 Mg Tabs (Digoxin) .... Take 1 tablet by mouth once a  day 9)  Systane Nighttime Oint (White petrolatum-mineral oil) .... Once daily as directed 10)  Lisinopril 20 Mg Tabs (Lisinopril) .Marland Kitchen.. 1 tablet by mouth daily 11)  Indomethacin 50 Mg Caps (Indomethacin) .... Take 1 tablet by mouth two times a day with food    Prescriptions: INDOMETHACIN 50 MG CAPS (INDOMETHACIN) Take 1 tablet by mouth two times a day with food  #14 x 0   Entered and Authorized by:   [name]   Signed by:   [name] on 12/24/2007   Method used:   Electronically to        [facility]  [phone]* (retail)       [address] [place], [place]  [address]       Ph: [phone] or [phone]       Fax: [phone]   RxID:   [id number]  ] Laboratory Results   Blood Tests    Date/Time Reported: December 24, 2007 12:29 PM   PT: 16.9 s   (Normal Range: 10.6-13.4)  INR: 1.9   (Normal Range: 0.88-1.12   Therap INR: 2.0-3.5) Comments: [name], CMA  December 24, 2007 12:29 PM       ANTICOAGULATION RECORD PREVIOUS REGIMEN & LAB RESULTS Anticoagulation Diagnosis:  Atrial fibrillation on  10/18/2006 Previous INR Goal Range:  2.0-3.0 on  10/18/2006 Previous INR:  1.7 on  11/25/2007 Previous Coumadin Dose(mg):  2.5X5DAYS/NONE  ON TU,TH on  06/18/2007 Previous Regimen:  none Tues. & Thurs. 2.5mg  other days on  11/25/2007 Previous Coagulation Comments:  Approved by Dr. Lovell Sheehan. on  10/22/2007  NEW REGIMEN & LAB RESULTS Current INR: 1.9 Regimen: same dose       Repeat testing in: 4 weeks MEDICATIONS ALPRAZOLAM 0.5 MG TABS (ALPRAZOLAM) Take 1 tablet by mouth once a day HYDROCHLOROTHIAZIDE 25 MG TABS (HYDROCHLOROTHIAZIDE) Take 1 tablet by mouth every morning OMEPRAZOLE 20 MG CPDR (OMEPRAZOLE) Take 1 capsule by mouth once a day VYTORIN 10-40 MG TABS (EZETIMIBE-SIMVASTATIN) Take 1 tablet by mouth once a day GNP ALLERGY RELIEF 10 MG  TBDP (LORATADINE) once daily WARFARIN SODIUM 5 MG TABS (WARFARIN SODIUM) once daily as  directed [facility] DAILY MULTIPLE   TABS (MULTIPLE VITAMIN) 1 once daily DIGOXIN 0.125 MG  TABS (DIGOXIN) Take 1 tablet by mouth once a day SYSTANE NIGHTTIME  OINT (WHITE PETROLATUM-MINERAL OIL) once daily as directed LISINOPRIL 20 MG TABS (LISINOPRIL) 1 tablet by mouth daily INDOMETHACIN 50 MG CAPS (INDOMETHACIN) Take 1 tablet by mouth two times a day with food   Anticoagulation Visit Questionnaire      Coumadin dose missed/changed:  No      Abnormal Bleeding Symptoms:  No   Any diet changes including alcohol intake, vegetables or greens since the last visit:  No Any illnesses or hospitalizations since the last visit:  No Any signs of clotting since the last visit (including chest discomfort,  dizziness, shortness of breath, arm tingling, slurred speech, swelling or redness in leg):  No

## 2010-02-09 NOTE — Assessment & Plan Note (Signed)
Summary: pt/njr   Nurse Visit   Vital Signs:  Patient profile:   77 year old female BP sitting:   124 / 82    Allergies: 1)  ! Acetyl Salicylic Acid (Aspirin)  Laboratory Results   Blood Tests     PT: 20.9 s   (Normal Range: 10.6-13.4)  INR: 3.0   (Normal Range: 0.88-1.12   Therap INR: 2.0-3.5) Comments: [name]  May 24, 2008 10:15 AM       Orders Added: 1)  Est. Patient Level I [99211] 2)  Fingerstick [36416] 3)  Protime [[id number]]      ANTICOAGULATION RECORD PREVIOUS REGIMEN & LAB RESULTS Anticoagulation Diagnosis:  Atrial fibrillation on  10/18/2006 Previous INR Goal Range:  2.0-3.0 on  10/18/2006 Previous INR:  1.7 on  04/23/2008 Previous Coumadin Dose(mg):  2.5mg  qd on  02/10/2008 Previous Regimen:  none Mon. or Thurs. 2.5 all other days on  03/24/2008 Previous Coagulation Comments:  Approved by Dr. Lovell Sheehan. on  10/22/2007  NEW REGIMEN & LAB RESULTS Current INR: 3.0 Regimen: none Mon. or Thurs. 2.5 all other days  (no change)  Repeat testing in: 1 month  Anticoagulation Visit Questionnaire Coumadin dose missed/changed:  No Abnormal Bleeding Symptoms:  No  Any diet changes including alcohol intake, vegetables or greens since the last visit:  No Any illnesses or hospitalizations since the last visit:  No Any signs of clotting since the last visit (including chest discomfort, dizziness, shortness of breath, arm tingling, slurred speech, swelling or redness in leg):  No  MEDICATIONS ALPRAZOLAM 0.5 MG TABS (ALPRAZOLAM) Take 1 tablet by mouth once a day HYDROCHLOROTHIAZIDE 25 MG TABS (HYDROCHLOROTHIAZIDE) Take 1 tablet by mouth every morning OMEPRAZOLE 20 MG CPDR (OMEPRAZOLE) Take 1 capsule by mouth once a day VYTORIN 10-40 MG TABS (EZETIMIBE-SIMVASTATIN) Take 1 tablet by mouth once a day GNP ALLERGY RELIEF 10 MG  TBDP (LORATADINE) once daily WARFARIN SODIUM 5 MG TABS (WARFARIN SODIUM) once daily as directed [facility]S DAILY MULTIPLE   TABS  (MULTIPLE VITAMIN) 1 once daily DIGOXIN 0.125 MG  TABS (DIGOXIN) Take 1 tablet by mouth once a day SYSTANE NIGHTTIME  OINT (WHITE PETROLATUM-MINERAL OIL) once daily as directed LISINOPRIL 20 MG TABS (LISINOPRIL) 1 tablet by mouth daily    Vital Signs:  Patient Profile:   77 year old female BP sitting:   124 / 82  (left arm)

## 2010-02-09 NOTE — Progress Notes (Signed)
Summary: Switch Medication  Phone Note Call from Patient Call back at Home Phone [phone]   Caller: Patient Summary of Call: Pt would like to switch from crestor to lipitor.  Please advise if this is ok and if so needs a rx. Initial call taken by: Trixie Dredge,  January 13, 2010 3:51 PM  Follow-up for Phone Call        could change to lipitor 40 mg by mouth once daily #90/3 Follow-up by: [name],  January 13, 2010 4:48 PM  Additional Follow-up for Phone Call Additional follow up Details #1::        LMTCB Additional Follow-up by: [facility] CMA AAMA,  January 13, 2010 5:03 PM    New/Updated Medications: LIPITOR 40 MG TABS (ATORVASTATIN CALCIUM) one  by mouth daily Prescriptions: LIPITOR 40 MG TABS (ATORVASTATIN CALCIUM) one  by mouth daily  #90 x 3   Entered by:   Lynann Beaver CMA AAMA   Authorized by:   [name]   Signed by:   Lynann Beaver CMA AAMA on 01/16/2010   Method used:   Electronically to        [facility]  [phone]* (retail)       [address] [place], [place]  [address]       Ph: [phone] or [phone]       Fax: [phone]   RxID:   [id number]  Notified pt.

## 2010-02-09 NOTE — Assessment & Plan Note (Signed)
Summary: pt/Deanna [name]   Nurse Visit   Vital Signs:  Patient Profile:   77 Years Old Female Pulse rate:   66 / minute BP sitting:   135 / 80  (left arm)                 Prior Medications: ALPRAZOLAM 0.5 MG TABS (ALPRAZOLAM) Take 1 tablet by mouth once a day HYDROCHLOROTHIAZIDE 25 MG TABS (HYDROCHLOROTHIAZIDE) Take 1 tablet by mouth every morning OMEPRAZOLE 20 MG CPDR (OMEPRAZOLE) Take 1 capsule by mouth once a day VYTORIN 10-40 MG TABS (EZETIMIBE-SIMVASTATIN) Take 1 tablet by mouth once a day GNP ALLERGY RELIEF 10 MG  TBDP (LORATADINE) once daily WARFARIN SODIUM 5 MG TABS (WARFARIN SODIUM) once daily as directed [facility] DAILY MULTIPLE   TABS (MULTIPLE VITAMIN) 1 once daily DIGOXIN 0.125 MG  TABS (DIGOXIN) Take 1 tablet by mouth once a day SYSTANE NIGHTTIME  OINT (WHITE PETROLATUM-MINERAL OIL) once daily as directed LISINOPRIL 20 MG TABS (LISINOPRIL) 1 tablet by mouth daily Current Allergies: ! ACETYL SALICYLIC ACID (ASPIRIN) Laboratory Results   Blood Tests    Date/Time Reported: October 22, 2007 1:46 PM   PT: 15.9 s   (Normal Range: 10.6-13.4)  INR: 1.7   (Normal Range: 0.88-1.12   Therap INR: 2.0-3.5) Comments: [name]  October 22, 2007 1:46 PM       Orders Added: 1)  Est. Patient Level I [99211] 2)  Fingerstick [36416] 3)  Protime Ila.Stager    ]  Vital Signs:  Patient Profile:   77 Years Old Female Pulse rate:   66 / minute BP sitting:   135 / 80                   ANTICOAGULATION RECORD PREVIOUS REGIMEN & LAB RESULTS Anticoagulation Diagnosis:  Atrial fibrillation on  10/18/2006 Previous INR Goal Range:  2.0-3.0 on  10/18/2006 Previous INR:  2.0 on  09/22/2007 Previous Coumadin Dose(mg):  2.5X5DAYS/NONE  ON TU,TH on  06/18/2007 Previous Regimen:  same on  08/19/2007 Previous Coagulation Comments:  OV on  04/01/2007  NEW REGIMEN & LAB RESULTS Current INR: 1.7 Regimen: Take this Thurs. then resume Coagulation Comments: Approved by Dr.  Lovell Sheehan. Repeat testing in: 2 weeks  Anticoagulation Visit Questionnaire Coumadin dose missed/changed:  No Abnormal Bleeding Symptoms:  No  Any diet changes including alcohol intake, vegetables or greens since the last visit:  No Any illnesses or hospitalizations since the last visit:  No Any signs of clotting since the last visit (including chest discomfort, dizziness, shortness of breath, arm tingling, slurred speech, swelling or redness in leg):  No  MEDICATIONS ALPRAZOLAM 0.5 MG TABS (ALPRAZOLAM) Take 1 tablet by mouth once a day HYDROCHLOROTHIAZIDE 25 MG TABS (HYDROCHLOROTHIAZIDE) Take 1 tablet by mouth every morning OMEPRAZOLE 20 MG CPDR (OMEPRAZOLE) Take 1 capsule by mouth once a day VYTORIN 10-40 MG TABS (EZETIMIBE-SIMVASTATIN) Take 1 tablet by mouth once a day GNP ALLERGY RELIEF 10 MG  TBDP (LORATADINE) once daily WARFARIN SODIUM 5 MG TABS (WARFARIN SODIUM) once daily as directed [facility] DAILY MULTIPLE   TABS (MULTIPLE VITAMIN) 1 once daily DIGOXIN 0.125 MG  TABS (DIGOXIN) Take 1 tablet by mouth once a day SYSTANE NIGHTTIME  OINT (WHITE PETROLATUM-MINERAL OIL) once daily as directed LISINOPRIL 20 MG TABS (LISINOPRIL) 1 tablet by mouth daily

## 2010-02-09 NOTE — Assessment & Plan Note (Signed)
Summary: pt labs//ccm   Nurse Visit   Allergies: 1)  ! Acetyl Salicylic Acid (Aspirin) Laboratory Results   Blood Tests      INR: 2.1   (Normal Range: 0.88-1.12   Therap INR: 2.0-3.5) Comments: [name]  November 16, 2009 11:50 AM     Orders Added: 1)  Est. Patient Level I [99211] 2)  Protime [64332RJ]   ANTICOAGULATION RECORD PREVIOUS REGIMEN & LAB RESULTS Anticoagulation Diagnosis:  Atrial fibrillation on  10/18/2006 Previous INR Goal Range:  2.0-3.0 on  10/18/2006 Previous INR:  2.2 on  10/14/2009 Previous Coumadin Dose(mg):  None only on  thursdays then 2.5mg  qd on  08/04/2009 Previous Regimen:  same on  09/16/2009 Previous Coagulation Comments:  Patient was asked to recheck her coumadin in 2 weeks but she decided to check it in 4 weeks. on  08/04/2009  NEW REGIMEN & LAB RESULTS Current INR: 2.1 Regimen: same  Repeat testing in: 4 weeks  Anticoagulation Visit Questionnaire Coumadin dose missed/changed:  No Abnormal Bleeding Symptoms:  No  Any diet changes including alcohol intake, vegetables or greens since the last visit:  No Any illnesses or hospitalizations since the last visit:  No Any signs of clotting since the last visit (including chest discomfort, dizziness, shortness of breath, arm tingling, slurred speech, swelling or redness in leg):  No  MEDICATIONS ALPRAZOLAM 0.5 MG TABS (ALPRAZOLAM) Take 1 tablet by mouth once a day OMEPRAZOLE 20 MG CPDR (OMEPRAZOLE) Take 1 capsule by mouth once a day GNP ALLERGY RELIEF 10 MG  TBDP (LORATADINE) once daily WARFARIN SODIUM 5 MG TABS (WARFARIN SODIUM) once daily as directed [facility] DAILY MULTIPLE   TABS (MULTIPLE VITAMIN) 1 once daily DIGOXIN 0.125 MG  TABS (DIGOXIN) Take 1 tablet by mouth once a day LISINOPRIL 20 MG TABS (LISINOPRIL) 1 tablet by mouth daily CRESTOR 40 MG TABS (ROSUVASTATIN CALCIUM) 1/2 by mouth once daily MELOXICAM 7.5 MG  TABS (MELOXICAM) one by mouth daily as needed OPTIVE 0.5-0.9 % SOLN  (CARBOXYMETHYLCELLUL-GLYCERIN)  CELEBREX 200 MG CAPS (CELECOXIB) 1 by mouth once daily as needed (not to exceed 2 capsules per week)

## 2010-02-09 NOTE — Progress Notes (Signed)
Summary: NEEDS A COPY OF RECENT EKG  Phone Note From Other Clinic   Caller: [facility] Call For: SWORDS Summary of Call: THEY NEED A COPY OF A RECENT EKG ASAP  FAX TO [phone] Initial call taken by: Roselle Locus,  December 16, 2006 11:44 AM  Follow-up for Phone Call        faxed.   Follow-up by: Kassie Mends,  December 16, 2006 3:20 PM

## 2010-02-09 NOTE — Assessment & Plan Note (Signed)
Summary: 6-8 week rov/et   Vital Signs:  Patient profile:   77 year old female Weight:      137 pounds BMI:     23.60 Temp:     98.4 degrees F oral Pulse rate:   84 / minute Pulse rhythm:   irregular Resp:     12 per minute BP sitting:   134 / 70  (left arm) Cuff size:   regular  Vitals Entered By: Gladis Riffle, RN (July 13, 2009 10:57 AM) CC: 6-8 week rov, labs done Is Patient Diabetic? No   CC:  6-8 week rov and labs done.  History of Present Illness:  Follow-Up Visit      This is an 77 year old woman who presents for Follow-up visit.  The patient denies chest pain and palpitations.  Since the last visit the patient notes no new problems or concerns.  The patient reports taking meds as prescribed.  When questioned about possible medication side effects, the patient notes none.    All other systems reviewed and were negative   Preventive Screening-Counseling & Management  Alcohol-Tobacco     Smoking Status: never  Current Problems (verified): 1)  Encounter For Therapeutic Drug Monitoring  (ICD-V58.83) 2)  Gout  (ICD-274.9) 3)  Coumadin Therapy  (ICD-V58.61) 4)  Tricuspid Regurgitation  (ICD-397.0) 5)  Hyperglycemia  (ICD-790.29) 6)  Atrial Fibrillation  (ICD-427.31) 7)  Hypertension  (ICD-401.9) 8)  Hyperlipidemia  (ICD-272.4) 9)  Gerd  (ICD-530.81) 10)  Anxiety  (ICD-300.00)  Current Medications (verified): 1)  Alprazolam 0.5 Mg Tabs (Alprazolam) .... Take 1 Tablet By Mouth Once A Day 2)  Hydrochlorothiazide 25 Mg Tabs (Hydrochlorothiazide) .... Take 1 Tablet By Mouth Every Morning 3)  Omeprazole 20 Mg Cpdr (Omeprazole) .... Take 1 Capsule By Mouth Once A Day 4)  Gnp Allergy Relief 10 Mg  Tbdp (Loratadine) .... Once Daily 5)  Warfarin Sodium 5 Mg Tabs (Warfarin Sodium) .... Once Daily As Directed 6)  [facility] Daily Multiple   Tabs (Multiple Vitamin) .Marland Kitchen.. 1 Once Daily 7)  Digoxin 0.125 Mg  Tabs (Digoxin) .... Take 1 Tablet By Mouth Once A Day 8)  Lisinopril 20 Mg Tabs  (Lisinopril) .Marland Kitchen.. 1 Tablet By Mouth Daily 9)  Crestor 20 Mg Tabs (Rosuvastatin Calcium) .... Take 1 Tablet By Mouth At Bedtime 10)  Celebrex 200 Mg Caps (Celecoxib) .... Take 1 Tablet By Mouth Once A Day As Needed.  Not To Exceed 2 Per Week  Allergies: 1)  ! Acetyl Salicylic Acid (Aspirin)  Past History:  Past Medical History: Last updated: 02/18/2007 Anxiety GERD-there was a qustion of Barrett's-not documented on Bx Hyperlipidemia Hypertension Mohs procedure.  Atrial fibrillation Colon Polyps  Past Surgical History: Last updated: 04/04/2009 bcc-face Hysterectomy Cataract extraction--left eye  Family History: Last updated: 10/15/2006 Family History of Colon CA 1st degree relative <60 Family History Breast cancer 1st degree relative <50 Family History Hypertension  Social History: Last updated: 09/23/2006 Retired Regular exercise-no Married  Risk Factors: Exercise: no (09/23/2006)  Risk Factors: Smoking Status: never (07/13/2009)  Physical Exam  General:  Well-developed,well-nourished,in no acute distress; alert,appropriate and cooperative throughout examination Head:  normocephalic and atraumatic.   Eyes:  pupils equal and pupils round.   Ears:  R ear normal and L ear normal.   Neck:  cervical spine tenderness Chest Wall:  No deformities, masses, or tenderness noted. Lungs:  normal respiratory effort and no intercostal retractions.   Heart:  normal rate and regular rhythm.   Abdomen:  Bowel sounds positive,abdomen soft and non-tender without masses, organomegaly or hernias noted. Msk:  No deformity or scoliosis noted of thoracic or lumbar spine.   Neurologic:  cranial nerves II-XII intact and gait normal.     Impression & Recommendations:  Problem # 1:  HYPERLIPIDEMIA (ICD-272.4)  reviewed labs improved continue current medications  Her updated medication list for this problem includes:    Crestor 20 Mg Tabs (Rosuvastatin calcium) .Marland Kitchen... Take 1 tablet  by mouth at bedtime  Labs Reviewed: SGOT: 36 (07/06/2009)   SGPT: 29 (07/06/2009)  Lipid Goals: Chol Goal: 200 (10/15/2006)   HDL Goal: 40 (10/15/2006)   LDL Goal: 130 (10/15/2006)   TG Goal: 150 (10/15/2006)  Prior 10 Yr Risk Heart Disease: Not enough information (10/15/2006)   HDL:43.30 (07/06/2009), 46.30 (05/19/2009)  LDL:DEL (09/22/2007), DEL (04/01/2007)  Chol:241 (07/06/2009), 305 (05/19/2009)  Trig:781.0 (07/06/2009), 1218.0 (05/19/2009) she wants a replacement for celebrex side effects disucssed she is willing to take all risks related to nsaids in oler people and nsaids with warfarin therapy  Complete Medication List: 1)  Alprazolam 0.5 Mg Tabs (Alprazolam) .... Take 1 tablet by mouth once a day 2)  Hydrochlorothiazide 25 Mg Tabs (Hydrochlorothiazide) .... Take 1 tablet by mouth every morning 3)  Omeprazole 20 Mg Cpdr (Omeprazole) .... Take 1 capsule by mouth once a day 4)  Gnp Allergy Relief 10 Mg Tbdp (Loratadine) .... Once daily 5)  Warfarin Sodium 5 Mg Tabs (Warfarin sodium) .... Once daily as directed 6)  Cvs Daily Multiple Tabs (Multiple vitamin) .Marland Kitchen.. 1 once daily 7)  Digoxin 0.125 Mg Tabs (Digoxin) .... Take 1 tablet by mouth once a day 8)  Lisinopril 20 Mg Tabs (Lisinopril) .Marland Kitchen.. 1 tablet by mouth daily 9)  Crestor 20 Mg Tabs (Rosuvastatin calcium) .... Take 1 tablet by mouth at bedtime 10)  Meloxicam 7.5 Mg Tabs (Meloxicam) .... One by mouth daily as needed  Patient Instructions: 1)  Please schedule a follow-up appointment in 3 months. Prescriptions: MELOXICAM 7.5 MG  TABS (MELOXICAM) one by mouth daily as needed  #20 x 0   Entered and Authorized by:   [name]   Signed by:   [name] on 07/13/2009   Method used:   Electronically to        [facility]  [phone]* (retail)       [address] [place], [place]  [address]       Ph: [phone] or [phone]       Fax: [phone]   RxID:   [id number]

## 2010-02-09 NOTE — Assessment & Plan Note (Signed)
Summary: PT/RS   Nurse Visit   Allergies: 1)  ! Acetyl Salicylic Acid (Aspirin) Laboratory Results   Blood Tests     PT: 20.2 s   (Normal Range: 10.6-13.4)  INR: 2.8   (Normal Range: 0.88-1.12   Therap INR: 2.0-3.5) Comments: [name]  December 29, 2008 10:33 AM     Orders Added: 1)  Est. Patient Level I [99211] 2)  Protime [[id number]]   ANTICOAGULATION RECORD PREVIOUS REGIMEN & LAB RESULTS Anticoagulation Diagnosis:  Atrial fibrillation on  10/18/2006 Previous INR Goal Range:  2.0-3.0 on  10/18/2006 Previous INR:  1.6 on  12/15/2008 Previous Coumadin Dose(mg):  none Mon. & Thurs others 2.5mg  on  06/21/2008 Previous Regimen:  Take 2.5mg  everyday. RTO 2 wks. on  12/15/2008 Previous Coagulation Comments:  Approved by Dr. Lovell Sheehan. on  10/22/2007  NEW REGIMEN & LAB RESULTS Current INR: 2.8 Regimen: same  Repeat testing in: 1 month  Anticoagulation Visit Questionnaire Coumadin dose missed/changed:  No Abnormal Bleeding Symptoms:  No  Any diet changes including alcohol intake, vegetables or greens since the last visit:  No Any illnesses or hospitalizations since the last visit:  No Any signs of clotting since the last visit (including chest discomfort, dizziness, shortness of breath, arm tingling, slurred speech, swelling or redness in leg):  No  MEDICATIONS ALPRAZOLAM 0.5 MG TABS (ALPRAZOLAM) Take 1 tablet by mouth once a day HYDROCHLOROTHIAZIDE 25 MG TABS (HYDROCHLOROTHIAZIDE) Take 1 tablet by mouth every morning OMEPRAZOLE 20 MG CPDR (OMEPRAZOLE) Take 1 capsule by mouth once a day GNP ALLERGY RELIEF 10 MG  TBDP (LORATADINE) once daily WARFARIN SODIUM 5 MG TABS (WARFARIN SODIUM) once daily as directed [facility] DAILY MULTIPLE   TABS (MULTIPLE VITAMIN) 1 once daily DIGOXIN 0.125 MG  TABS (DIGOXIN) Take 1 tablet by mouth once a day SYSTANE NIGHTTIME  OINT (WHITE PETROLATUM-MINERAL OIL) once daily as directed LISINOPRIL 20 MG TABS (LISINOPRIL) 1 tablet by mouth  daily SIMVASTATIN 40 MG TABS (SIMVASTATIN) Take one tablet at bedtime

## 2010-02-09 NOTE — Assessment & Plan Note (Signed)
Summary: lipids,lfts,bmet,a1c and counmdin check//db   Nurse Visit    Prior Medications: ALPRAZOLAM 0.5 MG TABS (ALPRAZOLAM) Take 1 tablet by mouth once a day HYDROCHLOROTHIAZIDE 25 MG TABS (HYDROCHLOROTHIAZIDE) Take 1 tablet by mouth every morning OMEPRAZOLE 20 MG CPDR (OMEPRAZOLE) Take 1 capsule by mouth once a day VYTORIN 10-40 MG TABS (EZETIMIBE-SIMVASTATIN) Take 1 tablet by mouth once a day GNP ALLERGY RELIEF 10 MG  TBDP (LORATADINE) once daily WARFARIN SODIUM 5 MG TABS (WARFARIN SODIUM) once daily as directed [facility] DAILY MULTIPLE   TABS (MULTIPLE VITAMIN) 1 once daily DIGOXIN 0.125 MG  TABS (DIGOXIN) Take 1 tablet by mouth once a day SUSTANE EYE DROPS () qd LISINOPRIL 20 MG TABS (LISINOPRIL) 1 tablet by mouth daily Current Allergies: ! ACETYL SALICYLIC ACID (ASPIRIN)    Orders Added: 1)  Venipuncture [36415] 2)  TLB-Lipid Panel [80061-LIPID] 3)  TLB-BMP (Basic Metabolic Panel-BMET) [80048-METABOL] 4)  TLB-Hepatic/Liver Function Pnl [80076-HEPATIC] 5)  TLB-A1C / Hgb A1C (Glycohemoglobin) [83036-A1C] 6)  Protime Ila.Stager    ]  Appended Document: Orders Update     Clinical Lists Changes  Observations: Added new observation of COMMENTS2: [name], CMA  September 22, 2007 4:19 PM  (09/22/2007 16:18) Added new observation of PT PATIENT: 17.2 s (09/22/2007 16:18) Added new observation of ABNORM BLEED: No  (09/22/2007 16:18) Added new observation of COUMADIN CHG: No  (09/22/2007 16:18) Added new observation of INR: 2.0  (09/22/2007 16:18)       ANTICOAGULATION RECORD PREVIOUS REGIMEN & LAB RESULTS Anticoagulation Diagnosis:  Atrial fibrillation on  10/18/2006 Previous INR Goal Range:  2.0-3.0 on  10/18/2006 Previous INR:  2.8 on  08/19/2007 Previous Coumadin Dose(mg):  2.5X5DAYS/NONE  ON TU,TH on  06/18/2007 Previous Regimen:  same on  08/19/2007 Previous Coagulation Comments:  OV on  04/01/2007  NEW REGIMEN & LAB RESULTS Current INR: 2.0 Regimen:  same  (no change)  MEDICATIONS ALPRAZOLAM 0.5 MG TABS (ALPRAZOLAM) Take 1 tablet by mouth once a day HYDROCHLOROTHIAZIDE 25 MG TABS (HYDROCHLOROTHIAZIDE) Take 1 tablet by mouth every morning OMEPRAZOLE 20 MG CPDR (OMEPRAZOLE) Take 1 capsule by mouth once a day VYTORIN 10-40 MG TABS (EZETIMIBE-SIMVASTATIN) Take 1 tablet by mouth once a day GNP ALLERGY RELIEF 10 MG  TBDP (LORATADINE) once daily WARFARIN SODIUM 5 MG TABS (WARFARIN SODIUM) once daily as directed [facility] DAILY MULTIPLE   TABS (MULTIPLE VITAMIN) 1 once daily DIGOXIN 0.125 MG  TABS (DIGOXIN) Take 1 tablet by mouth once a day * SUSTANE EYE DROPS qd LISINOPRIL 20 MG TABS (LISINOPRIL) 1 tablet by mouth daily   Anticoagulation Visit Questionnaire      Coumadin dose missed/changed:  No      Abnormal Bleeding Symptoms:  No   Any diet changes including alcohol intake, vegetables or greens since the last visit:  No Any illnesses or hospitalizations since the last visit:  No Any signs of clotting since the last visit (including chest discomfort, dizziness, shortness of breath, arm tingling, slurred speech, swelling or redness in leg):  No   Laboratory Results   Blood Tests    Date/Time Reported: September 22, 2007 4:19 PM   PT: 17.2 s   (Normal Range: 10.6-13.4)  INR: 2.0   (Normal Range: 0.88-1.12   Therap INR: 2.0-3.5) Comments: [name], CMA  September 22, 2007 4:19 PM

## 2010-02-09 NOTE — Progress Notes (Signed)
Summary: REQUEST FOR RETURN CALL  Phone Note Call from Patient   Caller: Patient Summary of Call: Pt adv she has questions concerning her meds (crestor v/s lipitor).... would like a return call to  [phone].  Initial call taken by: Debbra Riding,  January 23, 2010 3:08 PM  Follow-up for Phone Call        Do you want her to take Lipitor 40 mg. 1/2 or 1 daily? Follow-up by: Lynann Beaver CMA AAMA,  January 24, 2010 2:19 PM  Additional Follow-up for Phone Call Additional follow up Details #1::        Take 1 tablet by mouth once a day  Additional Follow-up by: [name],  January 24, 2010 2:55 PM    Additional Follow-up for Phone Call Additional follow up Details #2::    Pt notified. Follow-up by: Lynann Beaver CMA AAMA,  January 24, 2010 3:02 PM

## 2010-02-09 NOTE — Consult Note (Signed)
Summary: [facility]  [facility]   Imported By: Maryln Gottron 04/09/2007 13:53:47  _____________________________________________________________________  External Attachment:    Type:   Image     Comment:   External Document

## 2010-02-09 NOTE — Assessment & Plan Note (Signed)
Summary: 6 month rov/pt will come in fasting/njr   Vital Signs:  Patient profile:   77 year old female Weight:      132 pounds Temp:     97.7 degrees F Pulse rate:   74 / minute Pulse rhythm:   irregular BP sitting:   120 / 76  (left arm)  Vitals Entered By: Gladis Riffle, RN (March 24, 2008 10:01 AM)  History of Present Illness:  Follow-Up Visit      This is a 77 year old woman who presents for Follow-up visit.  The patient denies chest pain, palpitations, dizziness, syncope, low blood sugar symptoms, high blood sugar symptoms, edema, SOB, DOE, PND, and orthopnea.  Since the last visit the patient notes no new problems or concerns.  The patient reports taking meds as prescribed and not monitoring BP.  When questioned about possible medication side effects, the patient notes none.    Past Medical History: Anxiety GERD-there was a qustion of Barrett's-not documented on Bx Hyperlipidemia Hypertension Mohs procedure.  Atrial fibrillation Colon Polyps  Past Surgical History: bcc-face Hysterectomy  Social History: Retired Regular exercise-no Married  Family History: Family History of Colon CA 1st degree relative <60 Family History Breast cancer 1st degree relative <50 Family History Hypertension  no other complaints in a complete ROS   Current Problems (verified): 1)  Gout  (ICD-274.9) 2)  Coumadin Therapy  (ICD-V58.61) 3)  Encounter For Therapeutic Drug Monitoring  (ICD-V58.83) 4)  Tricuspid Regurgitation  (ICD-397.0) 5)  Hyperglycemia  (ICD-790.29) 6)  Atrial Fibrillation  (ICD-427.31) 7)  Family History Breast Cancer 1st Degree Relative <50  (ICD-V16.3) 8)  Family History of Colon Ca 1st Degree Relative <60  (ICD-V16.0) 9)  Hypertension  (ICD-401.9) 10)  Hyperlipidemia  (ICD-272.4) 11)  Gerd  (ICD-530.81) 12)  Anxiety  (ICD-300.00)  Current Medications (verified): 1)  Alprazolam 0.5 Mg Tabs (Alprazolam) .... Take 1 Tablet By Mouth Once A Day 2)  Hydrochlorothiazide  25 Mg Tabs (Hydrochlorothiazide) .... Take 1 Tablet By Mouth Every Morning 3)  Omeprazole 20 Mg Cpdr (Omeprazole) .... Take 1 Capsule By Mouth Once A Day 4)  Vytorin 10-40 Mg Tabs (Ezetimibe-Simvastatin) .... Take 1 Tablet By Mouth Once A Day 5)  Gnp Allergy Relief 10 Mg  Tbdp (Loratadine) .... Once Daily 6)  Warfarin Sodium 5 Mg Tabs (Warfarin Sodium) .... Once Daily As Directed 7)  Cvs Daily Multiple   Tabs (Multiple Vitamin) .Marland Kitchen.. 1 Once Daily 8)  Digoxin 0.125 Mg  Tabs (Digoxin) .... Take 1 Tablet By Mouth Once A Day 9)  Systane Nighttime  Oint (White Petrolatum-Mineral Oil) .... Once Daily As Directed 10)  Lisinopril 20 Mg Tabs (Lisinopril) .Marland Kitchen.. 1 Tablet By Mouth Daily  Allergies (verified): 1)  ! Acetyl Salicylic Acid (Aspirin)  Comments:  Nurse/Medical Assistant: 1 month rov, c/o diarrhea last night so took pepto bismol, fasting othherwise The patient's medications and allergies were reviewed with the patient and were updated in the Medication and Allergy Lists. Gladis Riffle, RN (March 24, 2008 10:03 AM)  Physical Exam  General:  Well-developed,well-nourished,in no acute distress; alert,appropriate and cooperative throughout examination Head:  normocephalic and atraumatic.   Eyes:  vision grossly intact and pupils round.   Ears:  R ear normal and L ear normal.   Nose:  no external deformity and no external erythema.   Neck:  cervical spine tenderness Chest Wall:  No deformities, masses, or tenderness noted. Lungs:  Normal respiratory effort, chest expands symmetrically. Lungs are clear to  auscultation, no crackles or wheezes. Heart:  Normal rate and regular rhythm. S1 and S2 normal without gallop, murmur, click, rub or other extra sounds. Abdomen:  suprpubic tenderness Msk:  Cervical spine tenderness MP joint tender 1st toe  rt foot, also bunion Pulses:  irregular rate Neurologic:  No cranial nerve deficits noted. Station and gait are normal. Plantar reflexes are down-going  bilaterally. DTRs are symmetrical throughout. Sensory, motor and coordinative functions appear intact. Skin:  Intact without suspicious lesions or rashes Cervical Nodes:  no anterior cervical adenopathy and no posterior cervical adenopathy.   Psych:  normally interactive and good eye contact.     Impression & Recommendations:  Problem # 1:  ATRIAL FIBRILLATION (ICD-427.31) rate controlled continue current meds Her updated medication list for this problem includes:    Warfarin Sodium 5 Mg Tabs (Warfarin sodium) ..... Once daily as directed  Problem # 2:  HYPERGLYCEMIA (ICD-790.29)  needs labs Labs Reviewed: Creat: 1.0 (09/22/2007)     Orders: Venipuncture (10272) TLB-A1C / Hgb A1C (Glycohemoglobin) (83036-A1C) TLB-Lipid Panel (80061-LIPID)  Problem # 3:  HYPERTENSION (ICD-401.9)  well controlled continue current medications  Her updated medication list for this problem includes:    Hydrochlorothiazide 25 Mg Tabs (Hydrochlorothiazide) .Marland Kitchen... Take 1 tablet by mouth every morning    Lisinopril 20 Mg Tabs (Lisinopril) .Marland Kitchen... 1 tablet by mouth daily  BP today: 120/76 Prior BP: 140/80 (03/02/2008)  Prior 10 Yr Risk Heart Disease: Not enough information (10/15/2006)  Labs Reviewed: Creat: 1.0 (09/22/2007) Chol: 169 (09/22/2007)   HDL: 33.5 (09/22/2007)   LDL: DEL (09/22/2007)   TG: 544 (09/22/2007)  Orders: TLB-BMP (Basic Metabolic Panel-BMET) (80048-METABOL)  Problem # 4:  HYPERLIPIDEMIA (ICD-272.4)  needs labs continue current medications  Her updated medication list for this problem includes:    Vytorin 10-40 Mg Tabs (Ezetimibe-simvastatin) .Marland Kitchen... Take 1 tablet by mouth once a day  Labs Reviewed: Chol: 169 (09/22/2007)   HDL: 33.5 (09/22/2007)   LDL: DEL (09/22/2007)   TG: 544 (09/22/2007) SGOT: 35 (09/22/2007)   SGPT: 35 (09/22/2007)  Lipid Goals: Chol Goal: 200 (10/15/2006)   HDL Goal: 40 (10/15/2006)   LDL Goal: 130 (10/15/2006)   TG Goal: 150 (10/15/2006)  Prior  10 Yr Risk Heart Disease: Not enough information (10/15/2006)  Orders: TLB-Hepatic/Liver Function Pnl (80076-HEPATIC) TLB-TSH (Thyroid Stimulating Hormone) (84443-TSH)  Problem # 5:  GOUT (ICD-274.9) no recurrence off of the allopurinol The following medications were removed from the medication list:    Allopurinol 300 Mg Tabs (Allopurinol) .Marland Kitchen... Take 1 tab every day  Complete Medication List: 1)  Alprazolam 0.5 Mg Tabs (Alprazolam) .... Take 1 tablet by mouth once a day 2)  Hydrochlorothiazide 25 Mg Tabs (Hydrochlorothiazide) .... Take 1 tablet by mouth every morning 3)  Omeprazole 20 Mg Cpdr (Omeprazole) .... Take 1 capsule by mouth once a day 4)  Vytorin 10-40 Mg Tabs (Ezetimibe-simvastatin) .... Take 1 tablet by mouth once a day 5)  Gnp Allergy Relief 10 Mg Tbdp (Loratadine) .... Once daily 6)  Warfarin Sodium 5 Mg Tabs (Warfarin sodium) .... Once daily as directed 7)  Cvs Daily Multiple Tabs (Multiple vitamin) .Marland Kitchen.. 1 once daily 8)  Digoxin 0.125 Mg Tabs (Digoxin) .... Take 1 tablet by mouth once a day 9)  Systane Nighttime Oint (White petrolatum-mineral oil) .... Once daily as directed 10)  Lisinopril 20 Mg Tabs (Lisinopril) .Marland Kitchen.. 1 tablet by mouth daily  Patient Instructions: 1)  Please schedule a follow-up appointment in 6 months. 2)  Make sur you  get your protime checked at least every month---THIS IS YOUR RESPONSIBILITY  Appended Document: 6 month rov/pt will come in fasting/njr  Laboratory Results   Blood Tests     PT: 23.4 s   (Normal Range: 10.6-13.4)  INR: 3.8   (Normal Range: 0.88-1.12   Therap INR: 2.0-3.5) Comments: [name]  March 24, 2008 11:08 AM       ANTICOAGULATION RECORD PREVIOUS REGIMEN & LAB RESULTS Anticoagulation Diagnosis:  Atrial fibrillation on  10/18/2006 Previous INR Goal Range:  2.0-3.0 on  10/18/2006 Previous INR:  1.6 on  03/02/2008 Previous Coumadin Dose(mg):  2.5mg  qd on  02/10/2008 Previous Regimen:  2.5mg  qd  on   03/02/2008 Previous Coagulation Comments:  Approved by Dr. Lovell Sheehan. on  10/22/2007  NEW REGIMEN & LAB RESULTS Current INR: 3.8 Regimen: none Mon. or Thurs. 2.5 all other days  Repeat testing in: 1 month  Anticoagulation Visit Questionnaire Coumadin dose missed/changed:  No Abnormal Bleeding Symptoms:  No  Any diet changes including alcohol intake, vegetables or greens since the last visit:  No Any illnesses or hospitalizations since the last visit:  No Any signs of clotting since the last visit (including chest discomfort, dizziness, shortness of breath, arm tingling, slurred speech, swelling or redness in leg):  No  MEDICATIONS ALPRAZOLAM 0.5 MG TABS (ALPRAZOLAM) Take 1 tablet by mouth once a day HYDROCHLOROTHIAZIDE 25 MG TABS (HYDROCHLOROTHIAZIDE) Take 1 tablet by mouth every morning OMEPRAZOLE 20 MG CPDR (OMEPRAZOLE) Take 1 capsule by mouth once a day VYTORIN 10-40 MG TABS (EZETIMIBE-SIMVASTATIN) Take 1 tablet by mouth once a day GNP ALLERGY RELIEF 10 MG  TBDP (LORATADINE) once daily WARFARIN SODIUM 5 MG TABS (WARFARIN SODIUM) once daily as directed [facility] DAILY MULTIPLE   TABS (MULTIPLE VITAMIN) 1 once daily DIGOXIN 0.125 MG  TABS (DIGOXIN) Take 1 tablet by mouth once a day SYSTANE NIGHTTIME  OINT (WHITE PETROLATUM-MINERAL OIL) once daily as directed LISINOPRIL 20 MG TABS (LISINOPRIL) 1 tablet by mouth daily     Appended Document: 6 month rov/pt will come in fasting/njr  Laboratory Results   Urine Tests    Routine Urinalysis   Color: yellow Appearance: Clear Glucose: negative   (Normal Range: Negative) Bilirubin: negative   (Normal Range: Negative) Ketone: negative   (Normal Range: Negative) Spec. Gravity: 1.015   (Normal Range: 1.003-1.035) Blood: trace-lysed   (Normal Range: Negative) pH: 5.0   (Normal Range: 5.0-8.0) Protein: negative   (Normal Range: Negative) Urobilinogen: 0.2   (Normal Range: 0-1) Nitrite: negative   (Normal Range: Negative) Leukocyte  Esterace: negative   (Normal Range: Negative)    Comments: [name]  March 24, 2008 2:47 PM

## 2010-02-09 NOTE — Assessment & Plan Note (Signed)
Summary: 6 MONTH ROV/NJR also protime/njr   Vital Signs:  Patient profile:   77 year old female Weight:      133 pounds Temp:     97.7 degrees F oral Pulse rate:   90 / minute Pulse rhythm:   regular Resp:     12 per minute BP sitting:   138 / 76  (left arm) Cuff size:   regular  Vitals Entered By: Gladis Riffle, RN (April 04, 2009 11:54 AM) CC: 6 month rov Is Patient Diabetic? No   CC:  6 month rov.  History of Present Illness:  Follow-Up Visit      This is an 77 year old woman who presents for Follow-up visit.  The patient denies chest pain and palpitations.  Since the last visit the patient notes no new problems or concerns.  The patient reports taking meds as prescribed.  When questioned about possible medication side effects, the patient notes none.   All other systems reviewed and were negative   Preventive Screening-Counseling & Management  Alcohol-Tobacco     Smoking Status: never  Current Problems (verified): 1)  Gout  (ICD-274.9) 2)  Coumadin Therapy  (ICD-V58.61) 3)  Tricuspid Regurgitation  (ICD-397.0) 4)  Hyperglycemia  (ICD-790.29) 5)  Atrial Fibrillation  (ICD-427.31) 6)  Hypertension  (ICD-401.9) 7)  Hyperlipidemia  (ICD-272.4) 8)  Gerd  (ICD-530.81) 9)  Anxiety  (ICD-300.00)  Current Medications (verified): 1)  Alprazolam 0.5 Mg Tabs (Alprazolam) .... Take 1 Tablet By Mouth Once A Day 2)  Hydrochlorothiazide 25 Mg Tabs (Hydrochlorothiazide) .... Take 1 Tablet By Mouth Every Morning 3)  Omeprazole 20 Mg Cpdr (Omeprazole) .... Take 1 Capsule By Mouth Once A Day 4)  Gnp Allergy Relief 10 Mg  Tbdp (Loratadine) .... Once Daily 5)  Warfarin Sodium 5 Mg Tabs (Warfarin Sodium) .... Once Daily As Directed 6)  Cvs Daily Multiple   Tabs (Multiple Vitamin) .Marland Kitchen.. 1 Once Daily 7)  Digoxin 0.125 Mg  Tabs (Digoxin) .... Take 1 Tablet By Mouth Once A Day 8)  Systane Nighttime  Oint (White Petrolatum-Mineral Oil) .... Once Daily As Directed 9)  Lisinopril 20 Mg Tabs  (Lisinopril) .Marland Kitchen.. 1 Tablet By Mouth Daily 10)  Simvastatin 40 Mg Tabs (Simvastatin) .... Take One Tablet At Bedtime 11)  Celebrex 200 Mg Caps (Celecoxib) .... Take 1 Tablet By Mouth Once A Day As Needed.  Not To Exceed 2 Per Week  Allergies: 1)  ! Acetyl Salicylic Acid (Aspirin)  Past History:  Past Medical History: Last updated: 02/18/2007 Anxiety GERD-there was a qustion of Barrett's-not documented on Bx Hyperlipidemia Hypertension Mohs procedure.  Atrial fibrillation Colon Polyps  Family History: Last updated: 10/15/2006 Family History of Colon CA 1st degree relative <60 Family History Breast cancer 1st degree relative <50 Family History Hypertension  Social History: Last updated: 09/23/2006 Retired Regular exercise-no Married  Risk Factors: Exercise: no (09/23/2006)  Risk Factors: Smoking Status: never (04/04/2009)  Past Surgical History: bcc-face Hysterectomy Cataract extraction--left eye  Social History: Smoking Status:  never  Review of Systems       All other systems reviewed and were negative   Physical Exam  General:  Well-developed,well-nourished,in no acute distress; alert,appropriate and cooperative throughout examination Head:  normocephalic and atraumatic.   Eyes:  pupils equal and pupils round.   Neck:  cervical spine tenderness Lungs:  Normal respiratory effort, chest expands symmetrically. Lungs are clear to auscultation, no crackles or wheezes. Heart:  regular rhythm and irregular rhythm.   Abdomen:  soft and non-tender.   Msk:  No deformity or scoliosis noted of thoracic or lumbar spine.   Neurologic:  cranial nerves II-XII intact and gait normal.     Impression & Recommendations:  Problem # 1:  GOUT (ICD-274.9) no recurrence  Problem # 2:  ATRIAL FIBRILLATION (ICD-427.31)  check protime today no bleeding complications Her updated medication list for this problem includes:    Warfarin Sodium 5 Mg Tabs (Warfarin sodium) .....  Once daily as directed    Digoxin 0.125 Mg Tabs (Digoxin) .Marland Kitchen... Take 1 tablet by mouth once a day  Orders: Protime (84132GM)  Problem # 3:  HYPERTENSION (ICD-401.9)  reasonable control continue current medications  Her updated medication list for this problem includes:    Hydrochlorothiazide 25 Mg Tabs (Hydrochlorothiazide) .Marland Kitchen... Take 1 tablet by mouth every morning    Lisinopril 20 Mg Tabs (Lisinopril) .Marland Kitchen... 1 tablet by mouth daily  BP today: 138/76 Prior BP: 126/80 (09/24/2008)  Prior 10 Yr Risk Heart Disease: Not enough information (10/15/2006)  Labs Reviewed: K+: 3.9 (09/24/2008) Creat: : 1.1 (09/24/2008)   Chol: 344 (02/16/2009)   HDL: 51.03 (02/16/2009)   LDL: DEL (09/22/2007)   TG: 1504.0 Lipemic mg/dL (01/10/7251)  Orders: Venipuncture (66440) TLB-BMP (Basic Metabolic Panel-BMET) (80048-METABOL)  Problem # 4:  GERD (ICD-530.81) controlled continue current medications  Her updated medication list for this problem includes:    Omeprazole 20 Mg Cpdr (Omeprazole) .Marland Kitchen... Take 1 capsule by mouth once a day  Complete Medication List: 1)  Alprazolam 0.5 Mg Tabs (Alprazolam) .... Take 1 tablet by mouth once a day 2)  Hydrochlorothiazide 25 Mg Tabs (Hydrochlorothiazide) .... Take 1 tablet by mouth every morning 3)  Omeprazole 20 Mg Cpdr (Omeprazole) .... Take 1 capsule by mouth once a day 4)  Gnp Allergy Relief 10 Mg Tbdp (Loratadine) .... Once daily 5)  Warfarin Sodium 5 Mg Tabs (Warfarin sodium) .... Once daily as directed 6)  Cvs Daily Multiple Tabs (Multiple vitamin) .Marland Kitchen.. 1 once daily 7)  Digoxin 0.125 Mg Tabs (Digoxin) .... Take 1 tablet by mouth once a day 8)  Systane Nighttime Oint (White petrolatum-mineral oil) .... Once daily as directed 9)  Lisinopril 20 Mg Tabs (Lisinopril) .Marland Kitchen.. 1 tablet by mouth daily 10)  Simvastatin 40 Mg Tabs (Simvastatin) .... Take one tablet at bedtime 11)  Celebrex 200 Mg Caps (Celecoxib) .... Take 1 tablet by mouth once a day as needed.   not to exceed 2 per week  Other Orders: TLB-Lipid Panel (80061-LIPID) TLB-Hepatic/Liver Function Pnl (80076-HEPATIC) TLB-TSH (Thyroid Stimulating Hormone) (84443-TSH) TLB-CBC Platelet - w/Differential (85025-CBCD)  Patient Instructions: 1)  Please schedule a follow-up appointment in 6 months.  Laboratory Results   Blood Tests   Date/Time Recieved: April 04, 2009 12:30 PM  Date/Time Reported: April 04, 2009 12:30 PM   PT: 19.8 s   (Normal Range: 10.6-13.4)  INR: 2.7   (Normal Range: 0.88-1.12   Therap INR: 2.0-3.5) Comments: [name], CMA  April 04, 2009 12:30 PM       ANTICOAGULATION RECORD PREVIOUS REGIMEN & LAB RESULTS Anticoagulation Diagnosis:  Atrial fibrillation on  10/18/2006 Previous INR Goal Range:  2.0-3.0 on  10/18/2006 Previous INR:  2.2 on  02/28/2009 Previous Coumadin Dose(mg):  2.5mg  qd on  02/28/2009 Previous Regimen:  2.5mg  qd on  02/28/2009 Previous Coagulation Comments:  Dr Lovell Sheehan approved on  01/26/2009  NEW REGIMEN & LAB RESULTS Current INR: 2.7 Regimen: 2.5mg  qd  (no change)       Repeat  testing in: 4  weeks MEDICATIONS ALPRAZOLAM 0.5 MG TABS (ALPRAZOLAM) Take 1 tablet by mouth once a day HYDROCHLOROTHIAZIDE 25 MG TABS (HYDROCHLOROTHIAZIDE) Take 1 tablet by mouth every morning OMEPRAZOLE 20 MG CPDR (OMEPRAZOLE) Take 1 capsule by mouth once a day GNP ALLERGY RELIEF 10 MG  TBDP (LORATADINE) once daily WARFARIN SODIUM 5 MG TABS (WARFARIN SODIUM) once daily as directed [facility] DAILY MULTIPLE   TABS (MULTIPLE VITAMIN) 1 once daily DIGOXIN 0.125 MG  TABS (DIGOXIN) Take 1 tablet by mouth once a day SYSTANE NIGHTTIME  OINT (WHITE PETROLATUM-MINERAL OIL) once daily as directed LISINOPRIL 20 MG TABS (LISINOPRIL) 1 tablet by mouth daily SIMVASTATIN 40 MG TABS (SIMVASTATIN) Take one tablet at bedtime CELEBREX 200 MG CAPS (CELECOXIB) Take 1 tablet by mouth once a day as needed.  Not to exceed 2 per week   Anticoagulation Visit Questionnaire       Coumadin dose missed/changed:  No      Abnormal Bleeding Symptoms:  No   Any diet changes including alcohol intake, vegetables or greens since the last visit:  No Any illnesses or hospitalizations since the last visit:  No Any signs of clotting since the last visit (including chest discomfort, dizziness, shortness of breath, arm tingling, slurred speech, swelling or redness in leg):  No

## 2010-02-09 NOTE — Assessment & Plan Note (Signed)
Summary: pt/njr   Nurse Visit     Allergies: 1)  ! Acetyl Salicylic Acid (Aspirin)  Laboratory Results   Blood Tests   Date/Time Received: June 21, 2008 9:42 AM  Date/Time Reported: June 21, 2008 9:42 AM   PT: 20.0 s   (Normal Range: 10.6-13.4)  INR: 2.7   (Normal Range: 0.88-1.12   Therap INR: 2.0-3.5) Comments: Darra Lis, RMA      Orders Added: 1)  Est. Patient Level I [84696] 2)  Protime [[id number]]      ANTICOAGULATION RECORD PREVIOUS REGIMEN & LAB RESULTS Anticoagulation Diagnosis:  Atrial fibrillation on  10/18/2006 Previous INR Goal Range:  2.0-3.0 on  10/18/2006 Previous INR:  3.0 on  05/24/2008 Previous Coumadin Dose(mg):  2.5mg  qd on  02/10/2008 Previous Regimen:  none Mon. or Thurs. 2.5 all other days on  03/24/2008 Previous Coagulation Comments:  Approved by Dr. Lovell Sheehan. on  10/22/2007  NEW REGIMEN & LAB RESULTS Current INR: 2.7 Current Coumadin Dose(mg): none Mon. & Thurs others 2.5mg  Regimen: Same   Provider: [facility]  Anticoagulation Visit Questionnaire Coumadin dose missed/changed:  No Abnormal Bleeding Symptoms:  No  Any diet changes including alcohol intake, vegetables or greens since the last visit:  No Any illnesses or hospitalizations since the last visit:  No Any signs of clotting since the last visit (including chest discomfort, dizziness, shortness of breath, arm tingling, slurred speech, swelling or redness in leg):  No  MEDICATIONS ALPRAZOLAM 0.5 MG TABS (ALPRAZOLAM) Take 1 tablet by mouth once a day HYDROCHLOROTHIAZIDE 25 MG TABS (HYDROCHLOROTHIAZIDE) Take 1 tablet by mouth every morning OMEPRAZOLE 20 MG CPDR (OMEPRAZOLE) Take 1 capsule by mouth once a day VYTORIN 10-40 MG TABS (EZETIMIBE-SIMVASTATIN) Take 1 tablet by mouth once a day GNP ALLERGY RELIEF 10 MG  TBDP (LORATADINE) once daily WARFARIN SODIUM 5 MG TABS (WARFARIN SODIUM) once daily as directed [facility] DAILY MULTIPLE   TABS (MULTIPLE VITAMIN) 1 once daily DIGOXIN  0.125 MG  TABS (DIGOXIN) Take 1 tablet by mouth once a day SYSTANE NIGHTTIME  OINT (WHITE PETROLATUM-MINERAL OIL) once daily as directed LISINOPRIL 20 MG TABS (LISINOPRIL) 1 tablet by mouth daily

## 2010-02-09 NOTE — Assessment & Plan Note (Signed)
Summary: counmadin check/db   Nurse Visit   Vital Signs:  Patient Profile:   77 Years Old Female Pulse rate:   79 / minute BP sitting:   124 / 81  (left arm)                 Prior Medications: ALPRAZOLAM 0.5 MG TABS (ALPRAZOLAM) Take 1 tablet by mouth once a day HYDROCHLOROTHIAZIDE 25 MG TABS (HYDROCHLOROTHIAZIDE) Take 1 tablet by mouth every morning OMEPRAZOLE 20 MG CPDR (OMEPRAZOLE) Take 1 capsule by mouth once a day VYTORIN 10-40 MG TABS (EZETIMIBE-SIMVASTATIN) Take 1 tablet by mouth once a day GNP ALLERGY RELIEF 10 MG  TBDP (LORATADINE) once daily WARFARIN SODIUM 5 MG TABS (WARFARIN SODIUM) once daily as directed [facility] DAILY MULTIPLE   TABS (MULTIPLE VITAMIN) 1 once daily DIGOXIN 0.125 MG  TABS (DIGOXIN) Take 1 tablet by mouth once a day SUSTANE EYE DROPS () qd LISINOPRIL 20 MG TABS (LISINOPRIL) 1 tablet by mouth daily Current Allergies: ! ACETYL SALICYLIC ACID (ASPIRIN) Laboratory Results   Blood Tests    Date/Time Reported: August 19, 2007 2:28 PM   PT: 20.2 s   (Normal Range: 10.6-13.4)  INR: 2.8   (Normal Range: 0.88-1.12   Therap INR: 2.0-3.5) Comments: [name]  August 19, 2007 2:28 PM       Orders Added: 1)  Est. Patient Level I [99211] 2)  Fingerstick [36416] 3)  Protime Ila.[email or web address]    ]  ANTICOAGULATION RECORD PREVIOUS REGIMEN & LAB RESULTS Anticoagulation Diagnosis:  Atrial fibrillation on  10/18/2006 Previous INR Goal Range:  2.0-3.0 on  10/18/2006 Previous INR:  3.1 on  07/23/2007 Previous Coumadin Dose(mg):  2.5X5DAYS/NONE  ON TU,TH on  06/18/2007 Previous Regimen:  SAME on  06/18/2007 Previous Coagulation Comments:  OV on  04/01/2007  NEW REGIMEN & LAB RESULTS Current INR: 2.8 Regimen: same  Repeat testing in: 4 weeks  Anticoagulation Visit Questionnaire Coumadin dose missed/changed:  No Abnormal Bleeding Symptoms:  No  Any diet changes including alcohol intake, vegetables or greens since the last visit:  No Any  illnesses or hospitalizations since the last visit:  No Any signs of clotting since the last visit (including chest discomfort, dizziness, shortness of breath, arm tingling, slurred speech, swelling or redness in leg):  No  MEDICATIONS ALPRAZOLAM 0.5 MG TABS (ALPRAZOLAM) Take 1 tablet by mouth once a day HYDROCHLOROTHIAZIDE 25 MG TABS (HYDROCHLOROTHIAZIDE) Take 1 tablet by mouth every morning OMEPRAZOLE 20 MG CPDR (OMEPRAZOLE) Take 1 capsule by mouth once a day VYTORIN 10-40 MG TABS (EZETIMIBE-SIMVASTATIN) Take 1 tablet by mouth once a day GNP ALLERGY RELIEF 10 MG  TBDP (LORATADINE) once daily WARFARIN SODIUM 5 MG TABS (WARFARIN SODIUM) once daily as directed [facility] DAILY MULTIPLE   TABS (MULTIPLE VITAMIN) 1 once daily DIGOXIN 0.125 MG  TABS (DIGOXIN) Take 1 tablet by mouth once a day * SUSTANE EYE DROPS qd LISINOPRIL 20 MG TABS (LISINOPRIL) 1 tablet by mouth daily

## 2010-02-16 ENCOUNTER — Other Ambulatory Visit: Payer: Self-pay | Admitting: *Deleted

## 2010-02-16 MED ORDER — FENOFIBRATE 150 MG PO CAPS: 150.0000 mg | ORAL_CAPSULE | Freq: Every day | ORAL | Status: DC

## 2010-02-16 MED ORDER — ALLOPURINOL 100 MG PO TABS: 100.0000 mg | ORAL_TABLET | Freq: Every day | ORAL | Status: DC

## 2010-02-20 ENCOUNTER — Other Ambulatory Visit: Payer: Self-pay | Admitting: Dermatology

## 2010-02-28 ENCOUNTER — Telehealth: Payer: Self-pay | Admitting: *Deleted

## 2010-02-28 DIAGNOSIS — K219 Gastro-esophageal reflux disease without esophagitis: Secondary | ICD-10-CM

## 2010-02-28 DIAGNOSIS — E785 Hyperlipidemia, unspecified: Secondary | ICD-10-CM

## 2010-02-28 MED ORDER — OMEPRAZOLE 20 MG PO TBEC: 20.0000 mg | DELAYED_RELEASE_TABLET | Freq: Every day | ORAL | Status: DC

## 2010-02-28 NOTE — Telephone Encounter (Signed)
Pt wants to change from Lipofen to a cheaper Fenofibrate.  She said its cheaper, she is also taking Lipitor.  [facility]

## 2010-03-01 ENCOUNTER — Telehealth: Payer: Self-pay | Admitting: Internal Medicine

## 2010-03-01 MED ORDER — FENOFIBRATE 145 MG PO TABS: 145.0000 mg | ORAL_TABLET | Freq: Every day | ORAL | Status: DC

## 2010-03-01 MED ORDER — OMEPRAZOLE 20 MG PO TBEC: 20.0000 mg | DELAYED_RELEASE_TABLET | Freq: Every day | ORAL | Status: DC

## 2010-03-01 NOTE — Telephone Encounter (Signed)
Ok   Same dose

## 2010-03-01 NOTE — Telephone Encounter (Signed)
Pt aware, [facility]

## 2010-03-01 NOTE — Telephone Encounter (Signed)
Opened in error

## 2010-03-06 ENCOUNTER — Ambulatory Visit: Payer: Self-pay

## 2010-03-07 ENCOUNTER — Ambulatory Visit (INDEPENDENT_AMBULATORY_CARE_PROVIDER_SITE_OTHER): Payer: MEDICARE | Admitting: Internal Medicine

## 2010-03-07 ENCOUNTER — Encounter: Payer: Self-pay | Admitting: Internal Medicine

## 2010-03-07 VITALS — BP 158/90 | HR 80 | Temp 99.0°F | Wt 136.0 lb

## 2010-03-07 DIAGNOSIS — K219 Gastro-esophageal reflux disease without esophagitis: Secondary | ICD-10-CM

## 2010-03-07 DIAGNOSIS — E785 Hyperlipidemia, unspecified: Secondary | ICD-10-CM

## 2010-03-07 MED ORDER — FENOFIBRATE 160 MG PO TABS: 160.0000 mg | ORAL_TABLET | Freq: Every day | ORAL | Status: DC

## 2010-03-07 MED ORDER — OMEPRAZOLE 20 MG PO TBEC: 20.0000 mg | DELAYED_RELEASE_TABLET | Freq: Every day | ORAL | Status: DC

## 2010-03-07 MED ORDER — ALLOPURINOL 300 MG PO TABS: 300.0000 mg | ORAL_TABLET | Freq: Every day | ORAL | Status: DC

## 2010-03-07 NOTE — Progress Notes (Signed)
  Subjective:    Patient ID: Deanna [name], female    DOB: Nov 10, 1926, 77 y.o.   MRN: [id number]  HPI  Pt comes in to discuss medications: "i just can't get them straight". Reviewed medications---see list. We reviewed every medication and it's indication.  She has no current complaints and is compliant with the medications that have been reviewed  Review of Systems     Objective:   Physical Exam        Assessment & Plan:

## 2010-03-07 NOTE — Assessment & Plan Note (Signed)
I reviewed her medications and excruciating detail. Each medication bottle was reviewed with the patient . its indication was rediscussed with the patient. Appropriate medications were refilled at the pharmacy of her choice. Greater than 25 minutes spent with the patient all face-to-face counseling regarding medications, interactions, refill request. All her questions were answered.

## 2010-03-10 ENCOUNTER — Encounter: Payer: Self-pay | Admitting: *Deleted

## 2010-03-10 ENCOUNTER — Telehealth: Payer: Self-pay | Admitting: Internal Medicine

## 2010-03-10 NOTE — Telephone Encounter (Signed)
Pt called and is needing samples of Lipofen 150 mg. Pt would like to pick these up today. Pls call.

## 2010-03-10 NOTE — Telephone Encounter (Signed)
Spoke to pt's daughter about her meds and left a printed med list up front for her to pick up.  Pt walked in wanting samples of lipofen and explained that she was taken off that med.

## 2010-03-16 ENCOUNTER — Other Ambulatory Visit: Payer: MEDICARE | Admitting: Internal Medicine

## 2010-03-16 DIAGNOSIS — E785 Hyperlipidemia, unspecified: Secondary | ICD-10-CM

## 2010-03-16 DIAGNOSIS — I4891 Unspecified atrial fibrillation: Secondary | ICD-10-CM

## 2010-03-16 DIAGNOSIS — T887XXA Unspecified adverse effect of drug or medicament, initial encounter: Secondary | ICD-10-CM

## 2010-03-16 LAB — HEPATIC FUNCTION PANEL
ALT: 23 U/L (ref 0–35)
AST: 28 U/L (ref 0–37)
Albumin: 3.9 g/dL (ref 3.5–5.2)
Alkaline Phosphatase: 52 U/L (ref 39–117)
Bilirubin, Direct: 0.2 mg/dL (ref 0.0–0.3)
Total Bilirubin: 0.8 mg/dL (ref 0.3–1.2)
Total Protein: 6.7 g/dL (ref 6.0–8.3)

## 2010-03-16 LAB — POCT INR: INR: 4.9

## 2010-03-16 LAB — LDL CHOLESTEROL, DIRECT: Direct LDL: 134.1 mg/dL

## 2010-03-16 LAB — LIPID PANEL
Cholesterol: 228 mg/dL — ABNORMAL HIGH (ref 0–200)
HDL: 38.5 mg/dL — ABNORMAL LOW (ref >39.00)
Total CHOL/HDL Ratio: 6
Triglycerides: 372 mg/dL — ABNORMAL HIGH (ref 0.0–149.0)
VLDL: 74.4 mg/dL — ABNORMAL HIGH (ref 0.0–40.0)

## 2010-03-16 NOTE — Progress Notes (Signed)
Patient said she was only going to come once a month. I told her it was important to check in two weeks but she refused.

## 2010-03-22 ENCOUNTER — Telehealth: Payer: Self-pay | Admitting: Internal Medicine

## 2010-03-22 NOTE — Telephone Encounter (Signed)
Pt called and is req lab results. Pls call back asap.

## 2010-03-24 ENCOUNTER — Telehealth: Payer: Self-pay | Admitting: Internal Medicine

## 2010-03-24 DIAGNOSIS — M109 Gout, unspecified: Secondary | ICD-10-CM

## 2010-03-24 DIAGNOSIS — I4891 Unspecified atrial fibrillation: Secondary | ICD-10-CM

## 2010-03-24 DIAGNOSIS — I10 Essential (primary) hypertension: Secondary | ICD-10-CM

## 2010-03-24 MED ORDER — LISINOPRIL 20 MG PO TABS: 20.0000 mg | ORAL_TABLET | Freq: Every day | ORAL | Status: DC

## 2010-03-24 MED ORDER — DIGOXIN 125 MCG PO TABS: 0.1250 mg | ORAL_TABLET | Freq: Every day | ORAL | Status: DC

## 2010-03-24 MED ORDER — WARFARIN SODIUM 5 MG PO TABS: 5.0000 mg | ORAL_TABLET | Freq: Every day | ORAL | Status: DC

## 2010-03-24 MED ORDER — ALLOPURINOL 300 MG PO TABS: 300.0000 mg | ORAL_TABLET | Freq: Every day | ORAL | Status: DC

## 2010-03-24 NOTE — Telephone Encounter (Signed)
Refill Lanoxin,Lisinopril,Allopurinol, Warfarin, for a 90 day supply. Please send to [facility].

## 2010-03-24 NOTE — Telephone Encounter (Signed)
Also, please send a 30 day supply of Lanoxin 0.125 mg to [facility].

## 2010-03-27 ENCOUNTER — Other Ambulatory Visit: Payer: Self-pay | Admitting: *Deleted

## 2010-03-27 DIAGNOSIS — F411 Generalized anxiety disorder: Secondary | ICD-10-CM

## 2010-03-27 MED ORDER — ALPRAZOLAM 0.5 MG PO TABS: 0.5000 mg | ORAL_TABLET | Freq: Every evening | ORAL | Status: DC | PRN

## 2010-03-29 ENCOUNTER — Other Ambulatory Visit: Payer: Self-pay | Admitting: Dermatology

## 2010-03-30 ENCOUNTER — Ambulatory Visit: Payer: MEDICARE

## 2010-03-30 DIAGNOSIS — I4891 Unspecified atrial fibrillation: Secondary | ICD-10-CM

## 2010-03-30 LAB — POCT INR: INR: 1.3

## 2010-03-30 NOTE — Patient Instructions (Signed)
2.5mg . Every day except none on Mon. Or Thurs.

## 2010-03-31 ENCOUNTER — Other Ambulatory Visit: Payer: Self-pay | Admitting: Internal Medicine

## 2010-04-12 ENCOUNTER — Ambulatory Visit (INDEPENDENT_AMBULATORY_CARE_PROVIDER_SITE_OTHER): Payer: MEDICARE | Admitting: Internal Medicine

## 2010-04-12 ENCOUNTER — Other Ambulatory Visit: Payer: Self-pay | Admitting: Dermatology

## 2010-04-12 DIAGNOSIS — E785 Hyperlipidemia, unspecified: Secondary | ICD-10-CM

## 2010-04-12 DIAGNOSIS — I4891 Unspecified atrial fibrillation: Secondary | ICD-10-CM

## 2010-04-12 DIAGNOSIS — I10 Essential (primary) hypertension: Secondary | ICD-10-CM

## 2010-04-12 DIAGNOSIS — M109 Gout, unspecified: Secondary | ICD-10-CM

## 2010-04-12 LAB — CBC WITH DIFFERENTIAL/PLATELET
Basophils Absolute: 0 10*3/uL (ref 0.0–0.1)
Basophils Relative: 0.3 % (ref 0.0–3.0)
Eosinophils Absolute: 0.1 10*3/uL (ref 0.0–0.7)
Eosinophils Relative: 1.1 % (ref 0.0–5.0)
HCT: 41.3 % (ref 36.0–46.0)
Hemoglobin: 13.7 g/dL (ref 12.0–15.0)
Lymphocytes Relative: 23 % (ref 12.0–46.0)
Lymphs Abs: 1.7 10*3/uL (ref 0.7–4.0)
MCHC: 33.2 g/dL (ref 30.0–36.0)
MCV: 93.3 fl (ref 78.0–100.0)
Monocytes Absolute: 0.7 10*3/uL (ref 0.1–1.0)
Monocytes Relative: 8.6 % (ref 3.0–12.0)
Neutro Abs: 5.1 10*3/uL (ref 1.4–7.7)
Neutrophils Relative %: 67 % (ref 43.0–77.0)
Platelets: 233 10*3/uL (ref 150.0–400.0)
RBC: 4.42 Mil/uL (ref 3.87–5.11)
RDW: 15.6 % — ABNORMAL HIGH (ref 11.5–14.6)
WBC: 7.6 10*3/uL (ref 4.5–10.5)

## 2010-04-12 LAB — BASIC METABOLIC PANEL
BUN: 13 mg/dL (ref 6–23)
CO2: 30 mEq/L (ref 19–32)
Calcium: 9.6 mg/dL (ref 8.4–10.5)
Chloride: 102 mEq/L (ref 96–112)
Creatinine, Ser: 1.2 mg/dL (ref 0.4–1.2)
GFR: 45.74 mL/min — ABNORMAL LOW (ref >60.00)
Glucose, Bld: 126 mg/dL — ABNORMAL HIGH (ref 70–99)
Potassium: 4.1 mEq/L (ref 3.5–5.1)
Sodium: 142 mEq/L (ref 135–145)

## 2010-04-12 LAB — POCT INR: INR: 2.5

## 2010-04-12 LAB — URIC ACID: Uric Acid, Serum: 3.2 mg/dL (ref 2.4–7.0)

## 2010-04-12 MED ORDER — ATORVASTATIN CALCIUM 40 MG PO TABS: 40.0000 mg | ORAL_TABLET | Freq: Every day | ORAL | Status: DC

## 2010-04-12 NOTE — Progress Notes (Signed)
  Subjective:    Patient ID: Deanna [name], female    DOB: 05-20-1926, 76 y.o.   MRN: [id number]  HPI  Acute visit---hx of gout-- now right ankle, with pain. Taking allopurinol  htn---taking lisinopril bid. Home bps: average 145-150/70  afib--no palpitations  Anticoagulation: Tolerating medications without difficulty. No bleeding complications.  Past Medical History  Diagnosis Date  . Anxiety   . GERD (gastroesophageal reflux disease)   . Hyperlipidemia   . Hypertension   . History of colon polyps   . Atrial fibrillation   . TRICUSPID REGURGITATION 10/23/2006  . Breast cancer    Past Surgical History  Procedure Date  . Abdominal hysterectomy   . Cataract extraction     left eye  . Bcc-face   . Mastectomy     bilateral    reports that she has never smoked. She does not have any smokeless tobacco history on file. Her alcohol and drug histories not on file. family history includes Breast cancer in her other; Colon cancer in her father and mother; and Hypertension in her mother. Allergies  Allergen Reactions  . Aspirin     REACTION: nervousness---tolerates ibuprofen     Review of Systems    patient denies chest pain, shortness of breath, orthopnea. Denies lower extremity edema, abdominal pain, change in appetite, change in bowel movements. Patient denies rashes, musculoskeletal complaints. No other specific complaints in a complete review of systems.    Objective:   Physical Exam Elderly female in no acute distress. HEENT exam atraumatic, normocephalic neck supple without lymphadenopathy or jugular venous distention. Chest clear to auscultation without increased work of breathing. Cardiac exam S1-S2 are irregular. No gallop. Abdominal exam active bowel sounds, soft, nontender. Extremities there is no clubbing cyanosis or edema. She has full range of motion of the ankles without significant pain.       Assessment & Plan:

## 2010-04-12 NOTE — Patient Instructions (Signed)
Same dose 

## 2010-04-17 ENCOUNTER — Telehealth: Payer: Self-pay | Admitting: Internal Medicine

## 2010-04-17 ENCOUNTER — Encounter: Payer: Self-pay | Admitting: Internal Medicine

## 2010-04-17 NOTE — Telephone Encounter (Signed)
Pt aware that uric acid is normal.  She wants to know if you still think its gout.

## 2010-04-17 NOTE — Telephone Encounter (Signed)
i think she has gout.

## 2010-04-17 NOTE — Assessment & Plan Note (Signed)
Not controlled. Increase lisinopril to 40 mg daily. She will take the 20 mg by mouth twice a day.

## 2010-04-17 NOTE — Assessment & Plan Note (Signed)
She has marked hyperlipidemia. She is on a statin and a fenofibrate. We'll continue the same for now. Compared to her baseline lipid panel is much improved.

## 2010-04-17 NOTE — Assessment & Plan Note (Signed)
Rate controlled. Tolerating warfarin. No bleeding complications. We'll continue the same.

## 2010-04-17 NOTE — Telephone Encounter (Signed)
Pt aware.

## 2010-04-17 NOTE — Telephone Encounter (Signed)
Requesting lab results

## 2010-04-19 ENCOUNTER — Telehealth: Payer: Self-pay | Admitting: Internal Medicine

## 2010-04-19 MED ORDER — SPIRONOLACTONE 50 MG PO TABS: 50.0000 mg | ORAL_TABLET | Freq: Every day | ORAL | Status: DC

## 2010-04-19 NOTE — Telephone Encounter (Signed)
Daughter notified of Dr. Marliss Coots recommendations.

## 2010-04-19 NOTE — Telephone Encounter (Signed)
Call patient. spironolactone added for better blood pressure control. It also might help swelling. The other treatment for bilateral lower extremity swelling would be compression stockings. These can be purchased at a medical supply store. He should be put on every morning and taken off before she goes to bed.

## 2010-04-19 NOTE — Telephone Encounter (Signed)
Please call and see if there are specific questions. O.k to review labs with patient again

## 2010-04-19 NOTE — Telephone Encounter (Signed)
Wants to speak directly to Dr Cato Mulligan about her labs that were done on 04-12-2010. Cindy did call with results. Daughter stated that Dr Cato Mulligan was going to call the daughter directly.

## 2010-04-19 NOTE — Telephone Encounter (Signed)
Called daughter, and discussed labs.  Her questions are what to do about the swollen Right foot.  Not extremely painful right now, but both feet swell and go down at times.  BPs are still in the 170/s/100, and asking whether BP meds should be changed.

## 2010-05-05 ENCOUNTER — Other Ambulatory Visit: Payer: Self-pay | Admitting: *Deleted

## 2010-05-05 MED ORDER — SPIRONOLACTONE 50 MG PO TABS: 50.0000 mg | ORAL_TABLET | Freq: Every day | ORAL | Status: DC

## 2010-05-05 MED ORDER — LISINOPRIL 20 MG PO TABS: 20.0000 mg | ORAL_TABLET | Freq: Two times a day (BID) | ORAL | Status: DC

## 2010-05-17 ENCOUNTER — Ambulatory Visit (INDEPENDENT_AMBULATORY_CARE_PROVIDER_SITE_OTHER): Payer: MEDICARE | Admitting: Internal Medicine

## 2010-05-17 DIAGNOSIS — I4891 Unspecified atrial fibrillation: Secondary | ICD-10-CM

## 2010-05-17 LAB — POCT INR: INR: 3.6

## 2010-05-17 NOTE — Patient Instructions (Signed)
Hold 2 days only then resume normal dose

## 2010-05-18 NOTE — Progress Notes (Signed)
What is her usual dose of warfarin?

## 2010-05-23 NOTE — Assessment & Plan Note (Signed)
[facility]                         GASTROENTEROLOGY OFFICE NOTE   Deanna [name], Deanna [name]                       MRN:          [id number]  DATE:02/18/2007                            DOB:          1926-05-11    PROBLEM:  History of colon polyps.   Deanna [name] has returned to schedule followup colonoscopy.  She was last  examined in 2004, where an adenomatous polyp was removed from her  transverse colon.  She has left-sided diverticula.  She also has an  esophageal stricture that was dilated two and a half years ago.  She  denies dysphagia.   Since her last visit, she was placed on Coumadin for new-onset atrial  fibrillation.   Other medications include omeprazole, HCTZ, Digoxin, Vytorin, Allergy  Relief, alprazolam and metoprolol.   She is ALLERGIC TO ASPIRIN.   PHYSICAL EXAMINATION:  Pulse 72, blood pressure 160/96, weight 151.  HEENT: EOMI.  PERRLA.  Sclerae are anicteric.  Conjunctivae are pink.  NECK:  Supple without thyromegaly, adenopathy or carotid bruits.  CHEST:  Clear to auscultation and percussion without adventitious  sounds.  CARDIAC:  Regular rhythm; normal S1 S2.  There are no murmurs, gallops  or rubs.  ABDOMEN:  Bowel sounds are normoactive.  Abdomen is soft, nontender and  nondistended.  There are no abdominal masses, tenderness, splenic  enlargement or hepatomegaly.  EXTREMITIES:  Full range of motion.  No cyanosis, clubbing or edema.  RECTAL:  Deferred.   IMPRESSION:  1. History of colon polyps.  2. Diverticulosis.  3. Atrial fibrillation - on Coumadin.   RECOMMENDATION:  Followup colonoscopy.  Coumadin will be held in  anticipation of this exam.     Barbette Hair. Arlyce Dice, MD,FACG  Electronically Signed    RDK/MedQ  DD: 02/18/2007  DT: 02/19/2007  Job #: [id number]   cc:   Valetta Mole. Swords, MD

## 2010-05-23 NOTE — Assessment & Plan Note (Signed)
[facility]                            CARDIOLOGY OFFICE NOTE   Deanna [name], Deanna [name]                       MRN:          [id number]  DATE:12/16/2006                            DOB:          [birth year]    Consult for atrial fibrillation per Dr. Cato Mulligan.   Deanna [name] is a delightful 77 year old patient whom Dr. Cato Mulligan referred  for atrial fibrillation.   The patient is totally asymptomatic as far as I can tell.  She was found  to be in atrial fibrillation on a routine exam about 2 months ago.  She  was started on Coumadin, digoxin, and Lopressor.  She continues to be  asymptomatic.  She had a 2D echocardiogram, which showed no significant  valvular heart disease and normal left ventricular function.   I questioned Deanna [name] closely, as well as her son.  She does everything she  wants to do.  She does not have significant chest pain, exertional  dyspnea, PND, orthopnea, palpitations.   There has been no evidence of low heart rates or pre-syncope.   I had a long discussion with Deanna [name] regarding multiple issues with atrial  fibrillation.  The first involved anticoagulation.  She understands the  risks of Coumadin.  She gets her Coumadin level checked at [facility].  She seems to be a bit sensitive to it, only 2.5 mg a day and no Coumadin  2 days a week.  She tends to like to drink alcohol.  We had a discussion  about this as well with regard to limiting her intake and the risk of  combined liver toxicity and high INR.   We also discussed the natural history of atrial fibrillation and the  fact that people with rate control and anticoagulation do as well as  those treated aggressively for cardioversion.  I think, currently since  she is asymptomatic with good rate control, we should continue this and  not attempt cardioversion.  She and her son seem to be in agreement.   PAST MEDICAL HISTORY:  Remarkable for significant hypertension treated  with meds.   Otherwise, remarkable for previous bilateral mastectomies for breast  cancer with implants.  History of hysterectomy.   She also has a history of reflux, anxiety and depression, hiatal hernia.   The patient is retired.  She used to work in Educational psychologist.  She has been retired for 18 years.  She has been a widower for 4 years.  She has 3 children, 2 of whom are close by and a son who is with her  today.  She does not smoke, but she does seem to drink a fair amount.   The patient is sensitive to ASPIRIN.   CURRENT MEDICATIONS:  1. Omeprazole 20 a day.  2. Hydrochlorothiazide 25 a day.  3. Coumadin as directed.  4. Multivitamins.  5. Dig 0.125 a day.  6. Vytorin 10/40.  7. Alprazolam 0.5 a day.  8. Lopressor at 25 b.i.d.   FAMILY HISTORY:  Noncontributory.   EXAM:  Remarkable for an elderly white female in no distress.  Blood pressure is 140/80.  She is in atrial fibrillation at a rate of  80.  Respiratory rate is 14.  Afebrile.  HEENT:  Unremarkable.  Carotids normal without bruit.  No lymphadenopathy, thyromegaly, or JVP  elevation.  LUNGS:  Clear with good diaphragmatic motion.  No wheezing.  S1, S2 with normal heart sounds.  PMI normal.  She is status post  bilateral breast implants.  ABDOMEN:  Benign.  Bowel sounds positive.  No AAA.  No  hepatosplenomegaly.  No hepatojugular reflux.  Distal pulses were intact.  No edema.  NEURO:  Nonfocal.  No muscular weakness.   EKG shows atrial fibrillation with nonspecific ST-T wave changes.  No  previous MI.  As indicated, her echocardiogram is reviewed with an EF of  55-60% and no valvular heart disease.   IMPRESSION:  1. Atrial fibrillation.  Continue dig, Lopressor, and anticoagulation,      currently asymptomatic.  Follow up in 6 months.  2. Hypertension, currently well-controlled.  Continue current doses of      medicines, including diuretics.  3. Hypercholesterolemia.  No documented coronary artery disease.       Continue Vytorin 10/40.  4. Anxiety and depression.  Continue p.r.n. alprazolam.  5. History of reflux and hiatal hernia.  Continue omeprazole 20 a day.   I will see the patient back in 6 months.  Again, there is no indication  here to pursue aggressive cardioversion.     Noralyn Pick. Eden Emms, MD, [facility]  Electronically Signed    PCN/MedQ  DD: 12/16/2006  DT: 12/16/2006  Job #: [id number]

## 2010-06-14 ENCOUNTER — Other Ambulatory Visit (INDEPENDENT_AMBULATORY_CARE_PROVIDER_SITE_OTHER): Payer: Medicare Other | Admitting: Internal Medicine

## 2010-06-14 DIAGNOSIS — I4891 Unspecified atrial fibrillation: Secondary | ICD-10-CM

## 2010-06-14 LAB — POCT INR: INR: 2.9

## 2010-06-14 NOTE — Patient Instructions (Signed)
Same dose 

## 2010-07-14 ENCOUNTER — Encounter: Payer: Self-pay | Admitting: Family Medicine

## 2010-07-14 ENCOUNTER — Ambulatory Visit (INDEPENDENT_AMBULATORY_CARE_PROVIDER_SITE_OTHER): Payer: Medicare Other | Admitting: Family Medicine

## 2010-07-14 DIAGNOSIS — I10 Essential (primary) hypertension: Secondary | ICD-10-CM

## 2010-07-14 DIAGNOSIS — H6120 Impacted cerumen, unspecified ear: Secondary | ICD-10-CM

## 2010-07-14 NOTE — Progress Notes (Signed)
  Subjective:    Patient ID: Deanna [name], female    DOB: 04-29-1926, 77 y.o.   MRN: [id number]  HPI Patient seen with cerumen impaction left canal. Uses hearing aids. 3 weeks ago went to hearing aid clinic and was told she had cerumen issues. Increased pressure left ear. No pain. No vertigo. No ear drainage.  Chronic problems are hypertension and history of atrial fibrillation. Compliant with blood pressure medications. Recent home blood pressure well controlled   Review of Systems  HENT: Positive for hearing loss. Negative for tinnitus and ear discharge.   Respiratory: Negative for cough and shortness of breath.   Cardiovascular: Negative for chest pain.  Neurological: Negative for headaches.       Objective:   Physical Exam  Constitutional: She appears well-developed and well-nourished.  HENT:  Head: Normocephalic and atraumatic.  Right Ear: External ear normal.       Left ear canal impacted with cerumen  Neck: Neck supple.  Cardiovascular: Normal rate.  Exam reveals no gallop.   Pulmonary/Chest: Effort normal and breath sounds normal. No respiratory distress. She has no wheezes. She has no rales.  Lymphadenopathy:    She has no cervical adenopathy.          Assessment & Plan:  #1 cerumen impaction left canal. Irrigation with removal of cerumen. Eardrum appears normal. No residual cerumen #2 hypertension history. Elevated reading today but well controlled by home readings recently. Recommend followup with primary within 2-3 weeks to reassess

## 2010-07-19 ENCOUNTER — Ambulatory Visit: Payer: Self-pay | Admitting: Internal Medicine

## 2010-07-20 ENCOUNTER — Ambulatory Visit: Payer: Self-pay | Admitting: Internal Medicine

## 2010-07-24 ENCOUNTER — Ambulatory Visit: Payer: Self-pay | Admitting: Internal Medicine

## 2010-07-31 ENCOUNTER — Ambulatory Visit: Payer: Self-pay | Admitting: Internal Medicine

## 2010-08-02 ENCOUNTER — Telehealth: Payer: Self-pay | Admitting: *Deleted

## 2010-08-02 ENCOUNTER — Encounter: Payer: Self-pay | Admitting: Family Medicine

## 2010-08-02 ENCOUNTER — Ambulatory Visit (INDEPENDENT_AMBULATORY_CARE_PROVIDER_SITE_OTHER): Payer: Medicare Other | Admitting: Family Medicine

## 2010-08-02 DIAGNOSIS — I10 Essential (primary) hypertension: Secondary | ICD-10-CM

## 2010-08-02 DIAGNOSIS — K219 Gastro-esophageal reflux disease without esophagitis: Secondary | ICD-10-CM

## 2010-08-02 DIAGNOSIS — I4891 Unspecified atrial fibrillation: Secondary | ICD-10-CM

## 2010-08-02 DIAGNOSIS — M109 Gout, unspecified: Secondary | ICD-10-CM

## 2010-08-02 DIAGNOSIS — E785 Hyperlipidemia, unspecified: Secondary | ICD-10-CM

## 2010-08-02 LAB — POCT INR: INR: 2.7

## 2010-08-02 MED ORDER — OMEPRAZOLE 20 MG PO TBEC: 20.0000 mg | DELAYED_RELEASE_TABLET | Freq: Every day | ORAL | Status: DC

## 2010-08-02 MED ORDER — LISINOPRIL 20 MG PO TABS: 20.0000 mg | ORAL_TABLET | Freq: Two times a day (BID) | ORAL | Status: DC

## 2010-08-02 MED ORDER — SPIRONOLACTONE 50 MG PO TABS: 50.0000 mg | ORAL_TABLET | Freq: Every day | ORAL | Status: DC

## 2010-08-02 MED ORDER — ATORVASTATIN CALCIUM 40 MG PO TABS: 40.0000 mg | ORAL_TABLET | Freq: Every day | ORAL | Status: AC

## 2010-08-02 NOTE — Progress Notes (Signed)
  Subjective:    Patient ID: Deanna [name], female    DOB: Oct 09, 1926, 77 y.o.   MRN: [id number]  HPI Followup multiple medical problems. Hyperlipidemia, gout, hypertension, atrial fibrillation, GERD.  Recent labs reviewed. Uric acid 3.2. Recent initiation of allopurinol. No recent gout flareups.  Hypertension and takes spirinolactone and lisinopril. Recent potassium normal. Atrial fibrillation on chronic Coumadin. No bleeding complications. Needs repeat INR today. Compliant with Coumadin therapy. Hyperlipidemia treated with Lipitor 40 mg daily. Needs refills. No myalgias. No history of CAD  History of GERD treated with omeprazole 20 mg daily. No recent breakthrough symptoms.   Review of Systems  Constitutional: Negative for fever, activity change, appetite change and fatigue.  HENT: Negative for hearing loss, ear pain, sore throat and trouble swallowing.   Eyes: Negative for visual disturbance.  Respiratory: Negative for cough and shortness of breath.   Cardiovascular: Negative for chest pain and palpitations.  Gastrointestinal: Negative for abdominal pain, diarrhea, constipation and blood in stool.  Genitourinary: Negative for dysuria and hematuria.  Musculoskeletal: Negative for myalgias, back pain and arthralgias.  Skin: Negative for rash.  Neurological: Negative for dizziness, syncope and headaches.  Hematological: Negative for adenopathy.  Psychiatric/Behavioral: Negative for confusion and dysphoric mood.       Objective:   Physical Exam  Constitutional: She is oriented to person, place, and time. She appears well-developed and well-nourished.  HENT:  Left Ear: External ear normal.  Mouth/Throat: Oropharynx is clear and moist.  Neck: Neck supple. No thyromegaly present.  Cardiovascular:       Irregular rhythm with a rate 68-72  Pulmonary/Chest: Effort normal and breath sounds normal. No respiratory distress. She has no wheezes. She has no rales.  Musculoskeletal: She  exhibits no edema.  Lymphadenopathy:    She has no cervical adenopathy.  Neurological: She is alert and oriented to person, place, and time.  Psychiatric: She has a normal mood and affect. Her behavior is normal.          Assessment & Plan:  #1 atrial fibrillation. Continue Coumadin. Check pro time today #2 hyperlipidemia. Refill Lipitor. Recent lipids reviewed. Repeat in 3 months #3 hypertension stable. Patient takes ACE inhibitor and spirinolactone but potassium is within normal. Blood pressure at goal #4 GERD stable refill omeprazole #5 Gout by hx.  No recurrence on Allopurinol.

## 2010-08-02 NOTE — Telephone Encounter (Signed)
Pt requesting transfer of care from Dr Cato Mulligan to Dr Caryl Never.

## 2010-08-02 NOTE — Patient Instructions (Signed)
Same dose 4 weeks

## 2010-08-06 NOTE — Telephone Encounter (Signed)
ok 

## 2010-08-07 NOTE — Telephone Encounter (Signed)
FYI

## 2010-08-10 ENCOUNTER — Telehealth: Payer: Self-pay | Admitting: *Deleted

## 2010-08-10 NOTE — Telephone Encounter (Signed)
Correction omeprazole from tabs to capsule

## 2010-09-13 ENCOUNTER — Ambulatory Visit (INDEPENDENT_AMBULATORY_CARE_PROVIDER_SITE_OTHER): Payer: Medicare Other | Admitting: Family Medicine

## 2010-09-13 DIAGNOSIS — I4891 Unspecified atrial fibrillation: Secondary | ICD-10-CM

## 2010-09-13 LAB — POCT INR: INR: 4.2

## 2010-09-13 NOTE — Patient Instructions (Signed)
None mondays wednesdays and fridays All other days take 2.5mg .

## 2010-09-14 ENCOUNTER — Ambulatory Visit: Payer: Medicare Other

## 2010-09-20 ENCOUNTER — Ambulatory Visit (INDEPENDENT_AMBULATORY_CARE_PROVIDER_SITE_OTHER): Payer: Medicare Other | Admitting: Family Medicine

## 2010-09-20 DIAGNOSIS — I4891 Unspecified atrial fibrillation: Secondary | ICD-10-CM

## 2010-09-20 LAB — POCT INR: INR: 3.4

## 2010-09-20 NOTE — Patient Instructions (Signed)
2.5mg . On mondays wednesdays and fridays none other days

## 2010-09-26 ENCOUNTER — Other Ambulatory Visit: Payer: Self-pay | Admitting: *Deleted

## 2010-09-26 DIAGNOSIS — F411 Generalized anxiety disorder: Secondary | ICD-10-CM

## 2010-09-26 DIAGNOSIS — M109 Gout, unspecified: Secondary | ICD-10-CM

## 2010-09-26 NOTE — Telephone Encounter (Signed)
Alprazolam 0.5 refill request, sig 1 tab every day?  Last filled #30 with 3 refills on 3/12. This is a Dr Cato Mulligan transfer pt.

## 2010-09-26 NOTE — Telephone Encounter (Signed)
Refill for 3 months. 

## 2010-09-27 MED ORDER — ALPRAZOLAM 0.5 MG PO TABS: 0.5000 mg | ORAL_TABLET | Freq: Every evening | ORAL | Status: DC | PRN

## 2010-10-04 ENCOUNTER — Ambulatory Visit (INDEPENDENT_AMBULATORY_CARE_PROVIDER_SITE_OTHER): Payer: Medicare Other | Admitting: Family Medicine

## 2010-10-04 DIAGNOSIS — I4891 Unspecified atrial fibrillation: Secondary | ICD-10-CM

## 2010-10-04 LAB — POCT INR: INR: 1.4

## 2010-10-04 NOTE — Patient Instructions (Addendum)
  Latest dosing instructions   Total Glynis Smiles Tue Wed Thu Fri Sat   10 2.5 mg  2.5 mg  2.5 mg  2.5 mg    (5 mg0.5)  (5 mg0.5)  (5 mg0.5)  (5 mg0.5)

## 2010-10-11 ENCOUNTER — Ambulatory Visit: Payer: Medicare Other

## 2010-10-18 ENCOUNTER — Ambulatory Visit (INDEPENDENT_AMBULATORY_CARE_PROVIDER_SITE_OTHER): Payer: Medicare Other | Admitting: Family Medicine

## 2010-10-18 DIAGNOSIS — I4891 Unspecified atrial fibrillation: Secondary | ICD-10-CM

## 2010-10-18 LAB — POCT INR: INR: 2.8

## 2010-10-18 NOTE — Patient Instructions (Signed)
°  Latest dosing instructions  ° Total Sun Mon Tue Wed Thu Fri Sat  ° 10 2.5 mg  2.5 mg  2.5 mg  2.5 mg  °  (5 mg×0.5)  (5 mg×0.5)  (5 mg×0.5)  (5 mg×0.5)  °  °  ° ° °

## 2010-10-25 ENCOUNTER — Ambulatory Visit (INDEPENDENT_AMBULATORY_CARE_PROVIDER_SITE_OTHER): Payer: Medicare Other | Admitting: Family Medicine

## 2010-10-25 DIAGNOSIS — Z299 Encounter for prophylactic measures, unspecified: Secondary | ICD-10-CM

## 2010-10-25 DIAGNOSIS — Z23 Encounter for immunization: Secondary | ICD-10-CM

## 2010-11-02 ENCOUNTER — Ambulatory Visit (INDEPENDENT_AMBULATORY_CARE_PROVIDER_SITE_OTHER): Payer: Medicare Other | Admitting: Family Medicine

## 2010-11-02 ENCOUNTER — Encounter: Payer: Self-pay | Admitting: Family Medicine

## 2010-11-02 DIAGNOSIS — G4700 Insomnia, unspecified: Secondary | ICD-10-CM

## 2010-11-02 DIAGNOSIS — I4891 Unspecified atrial fibrillation: Secondary | ICD-10-CM

## 2010-11-02 DIAGNOSIS — I10 Essential (primary) hypertension: Secondary | ICD-10-CM

## 2010-11-02 DIAGNOSIS — E785 Hyperlipidemia, unspecified: Secondary | ICD-10-CM

## 2010-11-02 LAB — HEPATIC FUNCTION PANEL
ALT: 28 U/L (ref 0–35)
AST: 37 U/L (ref 0–37)
Albumin: 4.4 g/dL (ref 3.5–5.2)
Alkaline Phosphatase: 48 U/L (ref 39–117)
Bilirubin, Direct: 0.2 mg/dL (ref 0.0–0.3)
Total Bilirubin: 1 mg/dL (ref 0.3–1.2)
Total Protein: 7.9 g/dL (ref 6.0–8.3)

## 2010-11-02 LAB — BASIC METABOLIC PANEL
BUN: 27 mg/dL — ABNORMAL HIGH (ref 6–23)
CO2: 28 mEq/L (ref 19–32)
Calcium: 10.7 mg/dL — ABNORMAL HIGH (ref 8.4–10.5)
Chloride: 103 mEq/L (ref 96–112)
Creatinine, Ser: 1.5 mg/dL — ABNORMAL HIGH (ref 0.4–1.2)
GFR: 36.72 mL/min — ABNORMAL LOW (ref >60.00)
Glucose, Bld: 107 mg/dL — ABNORMAL HIGH (ref 70–99)
Potassium: 6.1 mEq/L (ref 3.5–5.1)
Sodium: 143 mEq/L (ref 135–145)

## 2010-11-02 LAB — LIPID PANEL
Cholesterol: 279 mg/dL — ABNORMAL HIGH (ref 0–200)
HDL: 44.8 mg/dL (ref >39.00)
Total CHOL/HDL Ratio: 6
Triglycerides: 623 mg/dL — ABNORMAL HIGH (ref 0.0–149.0)
VLDL: 124.6 mg/dL — ABNORMAL HIGH (ref 0.0–40.0)

## 2010-11-02 LAB — LDL CHOLESTEROL, DIRECT: Direct LDL: 140.9 mg/dL

## 2010-11-02 NOTE — Patient Instructions (Signed)

## 2010-11-02 NOTE — Progress Notes (Signed)
  Subjective:    Patient ID: Deanna [name], female    DOB: 01-02-1927, 77 y.o.   MRN: [id number]  HPI  Medical followup. Unfortunately her son passed away last month from complications of aortic valve surgery. She is coping fairly well this time.  Medications reviewed. Compliant with all. Denies any side effects. She has history of severe dyslipidemia. Takes fenofibrate and Lipitor. No myalgias.  Hypertension treated with lisinopril. No cough or other side effect. Also takes spironolactone 50 mg.  Atrial fibrillation. Coumadin therapy. No bleeding complications. Denies recent fall. No recent dyspnea or dizziness.  She has history of gout. No recent flareups. Significantly fewer flareups since starting allopurinol.  Patient complains of some sleep disturbance. She relates this to recent stressors. Some depressed mood but overall coping well. Frequently sleeps late to around 10 AM. Mostly difficulty falling asleep. No consistent alcohol use.  Past Medical History  Diagnosis Date  . Anxiety   . GERD (gastroesophageal reflux disease)   . Hyperlipidemia   . Hypertension   . History of colon polyps   . Atrial fibrillation   . TRICUSPID REGURGITATION 10/23/2006  . Breast cancer    Past Surgical History  Procedure Date  . Abdominal hysterectomy   . Cataract extraction     left eye  . Bcc-face   . Mastectomy     bilateral    reports that she has never smoked. She does not have any smokeless tobacco history on file. Her alcohol and drug histories not on file. family history includes Breast cancer in her other; Colon cancer in her father and mother; and Hypertension in her mother. Allergies  Allergen Reactions  . Aspirin     REACTION: nervousness---tolerates ibuprofen     Review of Systems  Constitutional: Negative for fever, chills and appetite change.  Respiratory: Negative for cough and shortness of breath.   Cardiovascular: Negative for chest pain.  Gastrointestinal: Negative  for abdominal pain and blood in stool.  Genitourinary: Negative for hematuria.  Neurological: Negative for dizziness and headaches.  Psychiatric/Behavioral: Positive for sleep disturbance.       Objective:   Physical Exam  Constitutional: She is oriented to person, place, and time. She appears well-developed and well-nourished.  HENT:  Mouth/Throat: Oropharynx is clear and moist.  Neck: Neck supple.  Cardiovascular: Normal rate.        Irregular rhythm  Pulmonary/Chest: Effort normal and breath sounds normal. No respiratory distress. She has no wheezes. She has no rales.  Musculoskeletal: She exhibits no edema.  Neurological: She is alert and oriented to person, place, and time. No cranial nerve deficit.  Psychiatric: She has a normal mood and affect. Her behavior is normal.          Assessment & Plan:  #1 hyperlipidemia. Recheck lipid and hepatic panel #2 hypertension stable recheck basic metabolic panel #3 atrial fibrillation on Coumadin. Continue monthly INR. No recent bleeding complications  #4 insomnia. Handout on sleep hygiene given.

## 2010-11-03 ENCOUNTER — Other Ambulatory Visit (INDEPENDENT_AMBULATORY_CARE_PROVIDER_SITE_OTHER): Payer: Medicare Other

## 2010-11-03 DIAGNOSIS — I10 Essential (primary) hypertension: Secondary | ICD-10-CM

## 2010-11-03 LAB — BASIC METABOLIC PANEL
BUN: 28 mg/dL — ABNORMAL HIGH (ref 6–23)
CO2: 29 mEq/L (ref 19–32)
Calcium: 9.9 mg/dL (ref 8.4–10.5)
Chloride: 108 mEq/L (ref 96–112)
Creatinine, Ser: 1.5 mg/dL — ABNORMAL HIGH (ref 0.4–1.2)
GFR: 34.25 mL/min — ABNORMAL LOW (ref >60.00)
Glucose, Bld: 93 mg/dL (ref 70–99)
Potassium: 4.9 mEq/L (ref 3.5–5.1)
Sodium: 144 mEq/L (ref 135–145)

## 2010-11-06 ENCOUNTER — Telehealth: Payer: Self-pay | Admitting: Family Medicine

## 2010-11-06 NOTE — Telephone Encounter (Signed)
Reviewed labs with pt daughter

## 2010-11-06 NOTE — Telephone Encounter (Signed)
Pt requesting you contact her daughter regarding the results of labs

## 2010-11-06 NOTE — Telephone Encounter (Signed)
Pts daughter called to req labs results.

## 2010-11-06 NOTE — Progress Notes (Signed)
Quick Note:  Pt daughter informed ______ 

## 2010-11-20 ENCOUNTER — Ambulatory Visit (INDEPENDENT_AMBULATORY_CARE_PROVIDER_SITE_OTHER): Payer: Medicare Other

## 2010-11-20 DIAGNOSIS — I4891 Unspecified atrial fibrillation: Secondary | ICD-10-CM

## 2010-11-20 LAB — POCT INR: INR: 2.2

## 2010-11-20 NOTE — Patient Instructions (Signed)
°  Latest dosing instructions  ° Total Sun Mon Tue Wed Thu Fri Sat  ° 10 2.5 mg  2.5 mg  2.5 mg  2.5 mg  °  (5 mg×0.5)  (5 mg×0.5)  (5 mg×0.5)  (5 mg×0.5)  °  °  ° ° °

## 2010-12-20 ENCOUNTER — Ambulatory Visit: Payer: Medicare Other

## 2010-12-20 DIAGNOSIS — I4891 Unspecified atrial fibrillation: Secondary | ICD-10-CM

## 2010-12-20 LAB — POCT INR: INR: 1.6

## 2010-12-20 NOTE — Patient Instructions (Signed)
°  Latest dosing instructions  ° Total Sun Mon Tue Wed Thu Fri Sat  ° 10 2.5 mg  2.5 mg  2.5 mg  2.5 mg  °  (5 mg×0.5)  (5 mg×0.5)  (5 mg×0.5)  (5 mg×0.5)  °  °  ° ° °

## 2010-12-28 ENCOUNTER — Encounter: Payer: Self-pay | Admitting: Family Medicine

## 2010-12-28 ENCOUNTER — Ambulatory Visit (INDEPENDENT_AMBULATORY_CARE_PROVIDER_SITE_OTHER)
Admission: RE | Admit: 2010-12-28 | Discharge: 2010-12-28 | Disposition: A | Discharge status: Home / Self Care | Payer: Medicare Other | Source: Ambulatory Visit | Attending: Family Medicine | Admitting: Family Medicine

## 2010-12-28 ENCOUNTER — Ambulatory Visit (INDEPENDENT_AMBULATORY_CARE_PROVIDER_SITE_OTHER): Payer: Medicare Other | Admitting: Family Medicine

## 2010-12-28 DIAGNOSIS — M7050 Other bursitis of knee, unspecified knee: Secondary | ICD-10-CM

## 2010-12-28 DIAGNOSIS — F411 Generalized anxiety disorder: Secondary | ICD-10-CM

## 2010-12-28 DIAGNOSIS — M76899 Other specified enthesopathies of unspecified lower limb, excluding foot: Secondary | ICD-10-CM

## 2010-12-28 DIAGNOSIS — M25569 Pain in unspecified knee: Secondary | ICD-10-CM

## 2010-12-28 MED ORDER — ALPRAZOLAM 0.5 MG PO TABS: 0.5000 mg | ORAL_TABLET | Freq: Every evening | ORAL | Status: DC | PRN
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
## 2010-12-28 NOTE — Patient Instructions (Signed)
Bursitis Bursitis is a swelling and soreness (inflammation) of a fluid-filled sac (bursa) that overlies and protects a joint. It can be caused by injury, overuse of the joint, arthritis or infection. The joints most likely to be affected are the elbows, shoulders, hips and knees. HOME CARE INSTRUCTIONS   Apply ice to the affected area for 15 to 20 minutes each hour while awake for 2 days. Put the ice in a plastic bag and place a towel between the bag of ice and your skin.   Rest the injured joint as much as possible, but continue to put the joint through a full range of motion, 4 times per day. (The shoulder joint especially becomes rapidly "frozen" if not used.) When the pain lessens, begin normal slow movements and usual activities.   Only take over-the-counter or prescription medicines for pain, discomfort or fever as directed by your caregiver.   Your caregiver may recommend draining the bursa and injecting medicine into the bursa. This may help the healing process.   Follow all instructions for follow-up with your caregiver. This includes any orthopedic referrals, physical therapy and rehabilitation. Any delay in obtaining necessary care could result in a delay or failure of the bursitis to heal and chronic pain.  SEEK IMMEDIATE MEDICAL CARE IF:   Your pain increases even during treatment.   You develop an oral temperature above 102 F (38.9 C) and have heat and inflammation over the involved bursa.  MAKE SURE YOU:   Understand these instructions.   Will watch your condition.   Will get help right away if you are not doing well or get worse.  Document Released: 12/23/1999 Document Revised: 09/06/2010 Document Reviewed: 11/26/2008 [employer] Patient Information 2012 [place], [place].

## 2010-12-29 ENCOUNTER — Telehealth: Payer: Self-pay

## 2010-12-29 NOTE — Telephone Encounter (Signed)
Pt aware.

## 2010-12-29 NOTE — Telephone Encounter (Signed)
Message copied by Beverely Low on Fri Dec 29, 2010  3:37 PM ------      Message from: Adline Mango B      Created: Fri Dec 29, 2010  8:33 AM       Xray normal

## 2011-01-18 ENCOUNTER — Ambulatory Visit: Payer: Medicare Other

## 2011-01-18 DIAGNOSIS — I4891 Unspecified atrial fibrillation: Secondary | ICD-10-CM

## 2011-01-18 DIAGNOSIS — Z7901 Long term (current) use of anticoagulants: Secondary | ICD-10-CM

## 2011-01-18 DIAGNOSIS — Z5181 Encounter for therapeutic drug level monitoring: Secondary | ICD-10-CM

## 2011-01-18 LAB — POCT INR: INR: 2

## 2011-01-18 NOTE — Patient Instructions (Signed)
°  Latest dosing instructions  ° Total Sun Mon Tue Wed Thu Fri Sat  ° 10 2.5 mg  2.5 mg  2.5 mg  2.5 mg  °  (5 mg×0.5)  (5 mg×0.5)  (5 mg×0.5)  (5 mg×0.5)  °  °  ° ° °

## 2011-01-19 ENCOUNTER — Ambulatory Visit (INDEPENDENT_AMBULATORY_CARE_PROVIDER_SITE_OTHER): Payer: Medicare Other | Admitting: Family Medicine

## 2011-01-19 ENCOUNTER — Encounter: Payer: Self-pay | Admitting: Family Medicine

## 2011-01-19 VITALS — BP 150/72 | Temp 97.6°F | Wt 139.0 lb

## 2011-01-19 DIAGNOSIS — S8000XA Contusion of unspecified knee, initial encounter: Secondary | ICD-10-CM

## 2011-01-19 DIAGNOSIS — S8001XA Contusion of right knee, initial encounter: Secondary | ICD-10-CM

## 2011-01-19 NOTE — Patient Instructions (Signed)
Touch base in 4-6 weeks if no better
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
## 2011-02-19 ENCOUNTER — Other Ambulatory Visit: Payer: Self-pay

## 2011-02-19 DIAGNOSIS — F411 Generalized anxiety disorder: Secondary | ICD-10-CM

## 2011-02-19 NOTE — Telephone Encounter (Signed)
Last OV 01/19/11. Last filled 12/28/10 for #30 with 1 rf

## 2011-02-20 NOTE — Telephone Encounter (Signed)
Refill once.  Would prefer that she not take regularly as increased of falls with this medication in elderly.

## 2011-02-21 MED ORDER — ALPRAZOLAM 0.5 MG PO TABS: 0.5000 mg | ORAL_TABLET | Freq: Every evening | ORAL | Status: DC | PRN

## 2011-02-21 NOTE — Telephone Encounter (Signed)
Addended by: Beverely Low on: 02/21/2011 10:53 AM   Modules accepted: Orders

## 2011-02-21 NOTE — Telephone Encounter (Signed)
Rx phoned in.   

## 2011-02-22 ENCOUNTER — Ambulatory Visit: Payer: Medicare Other

## 2011-03-01 ENCOUNTER — Ambulatory Visit (INDEPENDENT_AMBULATORY_CARE_PROVIDER_SITE_OTHER): Payer: Medicare Other | Admitting: Family Medicine

## 2011-03-01 DIAGNOSIS — Z7901 Long term (current) use of anticoagulants: Secondary | ICD-10-CM

## 2011-03-01 DIAGNOSIS — I4891 Unspecified atrial fibrillation: Secondary | ICD-10-CM

## 2011-03-01 DIAGNOSIS — Z5181 Encounter for therapeutic drug level monitoring: Secondary | ICD-10-CM

## 2011-03-01 LAB — POCT INR: INR: 2.4

## 2011-03-01 NOTE — Patient Instructions (Signed)
°  Latest dosing instructions  ° Total Sun Mon Tue Wed Thu Fri Sat  ° 10 2.5 mg  2.5 mg  2.5 mg  2.5 mg  °  (5 mg×0.5)  (5 mg×0.5)  (5 mg×0.5)  (5 mg×0.5)  °  °  ° ° °

## 2011-03-23 ENCOUNTER — Encounter: Payer: Self-pay | Admitting: Family Medicine

## 2011-03-23 ENCOUNTER — Ambulatory Visit (INDEPENDENT_AMBULATORY_CARE_PROVIDER_SITE_OTHER): Payer: Medicare Other | Admitting: Family Medicine

## 2011-03-23 VITALS — BP 140/80 | Temp 97.5°F | Wt 138.0 lb

## 2011-03-23 DIAGNOSIS — I4891 Unspecified atrial fibrillation: Secondary | ICD-10-CM

## 2011-03-23 DIAGNOSIS — M109 Gout, unspecified: Secondary | ICD-10-CM

## 2011-03-23 DIAGNOSIS — E785 Hyperlipidemia, unspecified: Secondary | ICD-10-CM

## 2011-03-23 DIAGNOSIS — I10 Essential (primary) hypertension: Secondary | ICD-10-CM

## 2011-03-23 LAB — BASIC METABOLIC PANEL
BUN: 21 mg/dL (ref 6–23)
CO2: 29 mEq/L (ref 19–32)
Calcium: 10.1 mg/dL (ref 8.4–10.5)
Chloride: 102 mEq/L (ref 96–112)
Creatinine, Ser: 1.3 mg/dL — ABNORMAL HIGH (ref 0.4–1.2)
GFR: 40.88 mL/min — ABNORMAL LOW (ref >60.00)
Glucose, Bld: 125 mg/dL — ABNORMAL HIGH (ref 70–99)
Potassium: 4.4 mEq/L (ref 3.5–5.1)
Sodium: 140 mEq/L (ref 135–145)

## 2011-03-23 LAB — HEPATIC FUNCTION PANEL
ALT: 22 U/L (ref 0–35)
AST: 32 U/L (ref 0–37)
Albumin: 4.3 g/dL (ref 3.5–5.2)
Alkaline Phosphatase: 46 U/L (ref 39–117)
Bilirubin, Direct: 0.2 mg/dL (ref 0.0–0.3)
Total Bilirubin: 0.6 mg/dL (ref 0.3–1.2)
Total Protein: 7.6 g/dL (ref 6.0–8.3)

## 2011-03-23 LAB — LIPID PANEL
Cholesterol: 285 mg/dL — ABNORMAL HIGH (ref 0–200)
HDL: 44.4 mg/dL (ref >39.00)
Total CHOL/HDL Ratio: 6
Triglycerides: 582 mg/dL — ABNORMAL HIGH (ref 0.0–149.0)
VLDL: 116.4 mg/dL — ABNORMAL HIGH (ref 0.0–40.0)

## 2011-03-23 LAB — LDL CHOLESTEROL, DIRECT: Direct LDL: 143.4 mg/dL

## 2011-03-23 LAB — POCT INR: INR: 2.3

## 2011-03-23 NOTE — Patient Instructions (Signed)
°  Latest dosing instructions  ° Total Sun Mon Tue Wed Thu Fri Sat  ° 10 2.5 mg  2.5 mg  2.5 mg  2.5 mg  °  (5 mg×0.5)  (5 mg×0.5)  (5 mg×0.5)  (5 mg×0.5)  °  °  ° ° °

## 2011-03-23 NOTE — Progress Notes (Signed)
  Subjective:    Patient ID: Deanna [name], female    DOB: 04-20-1926, 78 y.o.   MRN: [id number]  HPI  Medical followup. Patient has history of hypertension, atrial fibrillation, dyslipidemia, GERD, and gout. Medications reviewed. Compliant with all. No recent bleeding complications on Coumadin.  She has history of dyslipidemia with high triglycerides. She stays on Lipitor but has not taken any other supplements. No history of CAD. No history of peripheral vascular disease. GERD symptoms stable on omeprazole. She denies any dizziness, shortness of breath, or chest pains.  Past Medical History  Diagnosis Date  . Anxiety   . GERD (gastroesophageal reflux disease)   . Hyperlipidemia   . Hypertension   . History of colon polyps   . Atrial fibrillation   . TRICUSPID REGURGITATION 10/23/2006  . Breast cancer    Past Surgical History  Procedure Date  . Abdominal hysterectomy   . Cataract extraction     left eye  . Bcc-face   . Mastectomy     bilateral    reports that she has never smoked. She does not have any smokeless tobacco history on file. Her alcohol and drug histories not on file. family history includes Breast cancer in her other; Colon cancer in her father and mother; and Hypertension in her mother. Allergies  Allergen Reactions  . Aspirin     REACTION: nervousness---tolerates ibuprofen      Review of Systems  Constitutional: Negative for fatigue.  Eyes: Negative for visual disturbance.  Respiratory: Negative for cough, chest tightness, shortness of breath and wheezing.   Cardiovascular: Negative for chest pain, palpitations and leg swelling.  Gastrointestinal: Negative for abdominal pain.  Genitourinary: Negative for dysuria.  Neurological: Negative for dizziness, seizures, syncope, weakness, light-headedness and headaches.  Psychiatric/Behavioral: Negative for dysphoric mood.       Objective:   Physical Exam  Constitutional: She is oriented to person, place, and  time. She appears well-developed and well-nourished.  HENT:  Head: Normocephalic and atraumatic.  Eyes: EOM are normal. Pupils are equal, round, and reactive to light.  Neck: Normal range of motion. Neck supple. No thyromegaly present.  Cardiovascular: Normal rate, regular rhythm and normal heart sounds.   Pulmonary/Chest: Breath sounds normal. No respiratory distress. She has no wheezes. She has no rales.  Abdominal: Soft. Bowel sounds are normal.  Musculoskeletal: Normal range of motion. She exhibits no edema.  Lymphadenopathy:    She has no cervical adenopathy.  Neurological: She is alert and oriented to person, place, and time. No cranial nerve deficit.  Skin: No rash noted.  Psychiatric: She has a normal mood and affect. Her behavior is normal. Judgment and thought content normal.          Assessment & Plan:  #1 dyslipidemia. Recheck lipid and hepatic panel. Discuss reduction of simple sugars and consideration for omega-3 supplement  #2 hypertension stable. Recheck basic metabolic panel  #3 GERD stable #4 history of gout. No recent flareups   #5 history of chronic intermittent atrial fibrillation. Check INR. Appears to be in sinus rhythm today

## 2011-03-26 ENCOUNTER — Other Ambulatory Visit: Payer: Self-pay | Admitting: *Deleted

## 2011-03-26 DIAGNOSIS — F411 Generalized anxiety disorder: Secondary | ICD-10-CM

## 2011-03-26 NOTE — Progress Notes (Signed)
Quick Note:  Pt called back and informed ______ 

## 2011-03-26 NOTE — Telephone Encounter (Signed)
Alprazolam refill request 0.5 mg at [facility] prn sleep.  Last filled #30 with 0 refills on 02/21/11

## 2011-03-27 MED ORDER — ALPRAZOLAM 0.5 MG PO TABS: 0.5000 mg | ORAL_TABLET | Freq: Every evening | ORAL | Status: DC | PRN

## 2011-03-27 NOTE — Telephone Encounter (Signed)
Informed pt to try not to take med regularly at [facility]

## 2011-03-27 NOTE — Telephone Encounter (Signed)
Addended by: Melchor Amour on: 03/27/2011 01:14 PM   Modules accepted: Orders

## 2011-03-27 NOTE — Telephone Encounter (Signed)
OK to refill but prefer for her not to take regularly at her age secondary to risk of falls.

## 2011-03-28 ENCOUNTER — Other Ambulatory Visit: Payer: Self-pay | Admitting: *Deleted

## 2011-03-28 ENCOUNTER — Other Ambulatory Visit: Payer: Self-pay | Admitting: Family Medicine

## 2011-03-28 DIAGNOSIS — I4891 Unspecified atrial fibrillation: Secondary | ICD-10-CM

## 2011-03-28 DIAGNOSIS — E785 Hyperlipidemia, unspecified: Secondary | ICD-10-CM

## 2011-03-28 MED ORDER — DIGOXIN 125 MCG PO TABS: 0.1250 mg | ORAL_TABLET | Freq: Every day | ORAL | Status: DC

## 2011-03-28 MED ORDER — FENOFIBRATE 160 MG PO TABS: 160.0000 mg | ORAL_TABLET | Freq: Every day | ORAL | Status: DC

## 2011-03-28 NOTE — Telephone Encounter (Signed)
Patient came in stating that her digoxin should be sent through [facility] when she refills it. Please assist.

## 2011-04-09 ENCOUNTER — Telehealth: Payer: Self-pay | Admitting: Family Medicine

## 2011-04-09 DIAGNOSIS — M109 Gout, unspecified: Secondary | ICD-10-CM

## 2011-04-09 MED ORDER — ALLOPURINOL 300 MG PO TABS: 300.0000 mg | ORAL_TABLET | Freq: Every day | ORAL | Status: DC

## 2011-04-09 NOTE — Telephone Encounter (Signed)
Pt requesting refill on allopurinol (ZYLOPRIM) 300 MG tablet   [facility]

## 2011-04-09 NOTE — Telephone Encounter (Signed)
I spoke with pt, explained I had filled this med on 3/16 to [facility].  She said they said they did not get approval to fill?  I will send refill again

## 2011-04-18 ENCOUNTER — Other Ambulatory Visit: Payer: Self-pay | Admitting: Dermatology

## 2011-04-23 ENCOUNTER — Ambulatory Visit (INDEPENDENT_AMBULATORY_CARE_PROVIDER_SITE_OTHER): Payer: Medicare Other | Admitting: Family Medicine

## 2011-04-23 ENCOUNTER — Encounter: Payer: Self-pay | Admitting: Family Medicine

## 2011-04-23 VITALS — BP 140/88 | Temp 98.3°F | Wt 137.0 lb

## 2011-04-23 DIAGNOSIS — F411 Generalized anxiety disorder: Secondary | ICD-10-CM

## 2011-04-23 DIAGNOSIS — I4891 Unspecified atrial fibrillation: Secondary | ICD-10-CM

## 2011-04-23 LAB — POCT INR: INR: 1.8

## 2011-04-23 MED ORDER — ALPRAZOLAM 0.5 MG PO TABS: 0.5000 mg | ORAL_TABLET | Freq: Every evening | ORAL | Status: DC | PRN

## 2011-04-23 NOTE — Progress Notes (Signed)
  Subjective:    Patient ID: Deanna [name], female    DOB: April 22, 1926, 78 y.o.   MRN: [id number]  HPI  Patient seen with increased anxiety symptoms. She recently quit taking alprazolam which has taken for years at night. We've suggested she try to taper off. She also took 240 mg over-the-counter Sudafed and had tremendous difficulty sleeping last night and increased anxiousness today.  Denies chest pains. No dyspnea. She had about 1 week history of sinus congestion which thinks maybe allergy related. Has taken Claritin without much relief. Some postnasal drip symptoms. Frequent sneezing. No fever or chills.  Also some L TMJ pain which she thinks is related to stress/anxiety from coming off Xanax.    Review of Systems  Constitutional: Negative for fever and chills.  HENT: Positive for congestion, postnasal drip and sinus pressure.   Respiratory: Negative for cough and shortness of breath.   Cardiovascular: Negative for chest pain.  Psychiatric/Behavioral: The patient is nervous/anxious.        Objective:   Physical Exam  Constitutional: She is oriented to person, place, and time. She appears well-developed and well-nourished.  HENT:  Right Ear: External ear normal.  Left Ear: External ear normal.  Mouth/Throat: Oropharynx is clear and moist.  Cardiovascular: Normal rate and regular rhythm.   Pulmonary/Chest: Effort normal and breath sounds normal. No respiratory distress. She has no wheezes. She has no rales.  Neurological: She is alert and oriented to person, place, and time. No cranial nerve deficit.          Assessment & Plan:  #1 sinus congestion. Probably allergic. Try plain Allegra 180 mg. Avoid Sudafed  #2 history of chronic insomnia and anxiety. Exacerbated by Sudafed. Leave off Sudafed. Refill alprazolam 0.5 mg 1 each bedtime

## 2011-04-23 NOTE — Patient Instructions (Addendum)
  Latest dosing instructions   Total Sun Mon Tue Wed Thu Fri Sat   10 2.5 mg Hold 2.5 mg Hold 2.5 mg Hold 2.5 mg    (5 mg0.5) Hold (5 mg0.5) Hold (5 mg0.5) Hold (5 mg0.5)       Try plain Allegra 180 mg once daily as needed for allergies.

## 2011-04-24 ENCOUNTER — Ambulatory Visit: Payer: Medicare Other

## 2011-05-22 ENCOUNTER — Ambulatory Visit (INDEPENDENT_AMBULATORY_CARE_PROVIDER_SITE_OTHER): Payer: Medicare Other | Admitting: Family Medicine

## 2011-05-22 DIAGNOSIS — I4891 Unspecified atrial fibrillation: Secondary | ICD-10-CM

## 2011-05-22 LAB — POCT INR: INR: 2.6

## 2011-05-22 NOTE — Patient Instructions (Signed)
  Latest dosing instructions   Total Sun Mon Tue Wed Thu Fri Sat   10 2.5 mg Hold 2.5 mg Hold 2.5 mg Hold 2.5 mg    (5 mg0.5) Hold (5 mg0.5) Hold (5 mg0.5) Hold (5 mg0.5)       

## 2011-06-26 ENCOUNTER — Ambulatory Visit (INDEPENDENT_AMBULATORY_CARE_PROVIDER_SITE_OTHER): Payer: Medicare Other | Admitting: Family

## 2011-06-26 DIAGNOSIS — I4891 Unspecified atrial fibrillation: Secondary | ICD-10-CM

## 2011-06-26 LAB — POCT INR: INR: 2.8

## 2011-06-26 NOTE — Patient Instructions (Addendum)
  Latest dosing instructions   Total Sun Mon Tue Wed Thu Fri Sat   10 2.5 mg Hold 2.5 mg Hold 2.5 mg Hold 2.5 mg    (5 mg0.5) Hold (5 mg0.5) Hold (5 mg0.5) Hold (5 mg0.5)

## 2011-07-24 ENCOUNTER — Ambulatory Visit (INDEPENDENT_AMBULATORY_CARE_PROVIDER_SITE_OTHER): Payer: Medicare Other | Admitting: Family

## 2011-07-24 DIAGNOSIS — Z7901 Long term (current) use of anticoagulants: Secondary | ICD-10-CM

## 2011-07-24 DIAGNOSIS — Z5181 Encounter for therapeutic drug level monitoring: Secondary | ICD-10-CM

## 2011-07-24 DIAGNOSIS — I4891 Unspecified atrial fibrillation: Secondary | ICD-10-CM

## 2011-07-24 LAB — POCT INR: INR: 3

## 2011-07-24 MED ORDER — LISINOPRIL 20 MG PO TABS: 20.0000 mg | ORAL_TABLET | Freq: Two times a day (BID) | ORAL | Status: DC

## 2011-07-24 MED ORDER — ATORVASTATIN CALCIUM 40 MG PO TABS: 40.0000 mg | ORAL_TABLET | Freq: Every day | ORAL | Status: DC

## 2011-07-24 MED ORDER — OMEPRAZOLE 20 MG PO CPDR: 20.0000 mg | DELAYED_RELEASE_CAPSULE | Freq: Every day | ORAL | Status: DC

## 2011-07-24 MED ORDER — SPIRONOLACTONE 50 MG PO TABS: 50.0000 mg | ORAL_TABLET | Freq: Every day | ORAL | Status: DC

## 2011-07-24 NOTE — Patient Instructions (Addendum)
Same dose.None on mondays,wednesdays and Fridays then take 2.5mg every Sunday Tuesday Thursday and Saturday. Recheck 6 weeks.    Latest dosing instructions   Total Sun Mon Tue Wed Thu Fri Sat   10 2.5 mg Hold 2.5 mg Hold 2.5 mg Hold 2.5 mg    (5 mg0.5) Hold (5 mg0.5) Hold (5 mg0.5) Hold (5 mg0.5)        

## 2011-09-04 ENCOUNTER — Ambulatory Visit (INDEPENDENT_AMBULATORY_CARE_PROVIDER_SITE_OTHER): Payer: Medicare Other | Admitting: Family

## 2011-09-04 DIAGNOSIS — I4891 Unspecified atrial fibrillation: Secondary | ICD-10-CM

## 2011-09-04 LAB — POCT INR: INR: 2.9

## 2011-09-04 NOTE — Patient Instructions (Signed)
Same dose.None on mondays,wednesdays and Fridays then take 2.5mg every Sunday Tuesday Thursday and Saturday. Recheck 6 weeks.    Latest dosing instructions   Total Sun Mon Tue Wed Thu Fri Sat   10 2.5 mg Hold 2.5 mg Hold 2.5 mg Hold 2.5 mg    (5 mg0.5) Hold (5 mg0.5) Hold (5 mg0.5) Hold (5 mg0.5)        

## 2011-09-25 ENCOUNTER — Ambulatory Visit: Payer: Medicare Other | Admitting: Family Medicine

## 2011-10-16 ENCOUNTER — Ambulatory Visit (INDEPENDENT_AMBULATORY_CARE_PROVIDER_SITE_OTHER): Payer: Medicare Other | Admitting: Family Medicine

## 2011-10-16 ENCOUNTER — Ambulatory Visit (INDEPENDENT_AMBULATORY_CARE_PROVIDER_SITE_OTHER): Payer: Medicare Other | Admitting: Family

## 2011-10-16 ENCOUNTER — Encounter: Payer: Self-pay | Admitting: Family Medicine

## 2011-10-16 VITALS — BP 142/82 | Temp 97.6°F | Wt 136.0 lb

## 2011-10-16 DIAGNOSIS — E785 Hyperlipidemia, unspecified: Secondary | ICD-10-CM

## 2011-10-16 DIAGNOSIS — M109 Gout, unspecified: Secondary | ICD-10-CM

## 2011-10-16 DIAGNOSIS — I4891 Unspecified atrial fibrillation: Secondary | ICD-10-CM

## 2011-10-16 DIAGNOSIS — Z23 Encounter for immunization: Secondary | ICD-10-CM

## 2011-10-16 DIAGNOSIS — I10 Essential (primary) hypertension: Secondary | ICD-10-CM

## 2011-10-16 LAB — LIPID PANEL
Cholesterol: 265 mg/dL — ABNORMAL HIGH (ref 0–200)
HDL: 31 mg/dL — ABNORMAL LOW (ref >39.00)
Total CHOL/HDL Ratio: 9
Triglycerides: 728 mg/dL — ABNORMAL HIGH (ref 0.0–149.0)
VLDL: 145.6 mg/dL — ABNORMAL HIGH (ref 0.0–40.0)

## 2011-10-16 LAB — HEPATIC FUNCTION PANEL
ALT: 26 U/L (ref 0–35)
AST: 32 U/L (ref 0–37)
Albumin: 4 g/dL (ref 3.5–5.2)
Alkaline Phosphatase: 43 U/L (ref 39–117)
Bilirubin, Direct: 0.1 mg/dL (ref 0.0–0.3)
Total Bilirubin: 0.9 mg/dL (ref 0.3–1.2)
Total Protein: 7.6 g/dL (ref 6.0–8.3)

## 2011-10-16 LAB — LDL CHOLESTEROL, DIRECT: Direct LDL: 114 mg/dL

## 2011-10-16 LAB — BASIC METABOLIC PANEL
BUN: 26 mg/dL — ABNORMAL HIGH (ref 6–23)
CO2: 27 mEq/L (ref 19–32)
Calcium: 10.1 mg/dL (ref 8.4–10.5)
Chloride: 103 mEq/L (ref 96–112)
Creatinine, Ser: 1.4 mg/dL — ABNORMAL HIGH (ref 0.4–1.2)
GFR: 39.44 mL/min — ABNORMAL LOW (ref >60.00)
Glucose, Bld: 103 mg/dL — ABNORMAL HIGH (ref 70–99)
Potassium: 5 mEq/L (ref 3.5–5.1)
Sodium: 139 mEq/L (ref 135–145)

## 2011-10-16 LAB — POCT INR: INR: 2.4

## 2011-10-16 NOTE — Progress Notes (Signed)
  Subjective:    Patient ID: Deanna [name], female    DOB: Aug 03, 1926, 78 y.o.   MRN: [id number]  HPI  Medical followup. Patient has history of hypertension, atrial fibrillation, hyperlipidemia, GERD, and reported gout.  She's been on allopurinol for several years with no recent flareups. Her medications reviewed. Compliant with all. She had INR today 2.4. No recent bleeding complications. She was placed on digoxin apparently because of her atrial fibrillation. No history of CHF. She needs flu vaccine. Has had previous Pneumovax. He's had prior history of breast cancer and bilateral mastectomies. Denies recent appetite or weight changes. No chest pains. No dizziness.  Past Medical History  Diagnosis Date  . Anxiety   . GERD (gastroesophageal reflux disease)   . Hyperlipidemia   . Hypertension   . History of colon polyps   . Atrial fibrillation   . TRICUSPID REGURGITATION 10/23/2006  . Breast cancer    Past Surgical History  Procedure Date  . Abdominal hysterectomy   . Cataract extraction     left eye  . Bcc-face   . Mastectomy     bilateral    reports that she has never smoked. She does not have any smokeless tobacco history on file. Her alcohol and drug histories not on file. family history includes Breast cancer in her other; Colon cancer in her father and mother; and Hypertension in her mother. Allergies  Allergen Reactions  . Aspirin     REACTION: nervousness---tolerates ibuprofen      Review of Systems  Constitutional: Negative for appetite change, fatigue and unexpected weight change.  Eyes: Negative for visual disturbance.  Respiratory: Negative for cough, chest tightness, shortness of breath and wheezing.   Cardiovascular: Negative for chest pain, palpitations and leg swelling.  Gastrointestinal: Negative for abdominal pain.  Neurological: Negative for dizziness, seizures, syncope, weakness, light-headedness and headaches.  Psychiatric/Behavioral: Negative for  dysphoric mood.       Objective:   Physical Exam  Constitutional: She appears well-developed and well-nourished. No distress.  HENT:  Right Ear: External ear normal.  Left Ear: External ear normal.  Mouth/Throat: Oropharynx is clear and moist.  Neck: Neck supple. No thyromegaly present.  Cardiovascular: Normal rate.        Irregular rhythm  Pulmonary/Chest: Effort normal and breath sounds normal.  Musculoskeletal: She exhibits no edema.          Assessment & Plan:  #1 atrial fibrillation. Stable rate. INR ankle. Discussed digoxin. She is on low-dose has been stable this for several years. We discussed other possible alternatives such as beta blocker but this point she wishes to continue #2 dyslipidemia. Recheck lipid and hepatic panel. She's had extremely elevated cholesterol the past in spite of current therapy. Consider addition of Zetia. #3 GERD stable continue omeprazole #4 possible history of gout.  Discussed whether to continue with allopurinol and at this point she wishes to maintain. #5 hypertension. Stable. Check basic metabolic panel

## 2011-10-16 NOTE — Patient Instructions (Addendum)
Same dose.None on mondays,wednesdays and Fridays then take 2.5mg every Sunday Tuesday Thursday and Saturday. Recheck 6 weeks.    Latest dosing instructions   Total Sun Mon Tue Wed Thu Fri Sat   10 2.5 mg Hold 2.5 mg Hold 2.5 mg Hold 2.5 mg    (5 mg0.5) Hold (5 mg0.5) Hold (5 mg0.5) Hold (5 mg0.5)        

## 2011-10-18 ENCOUNTER — Telehealth: Payer: Self-pay | Admitting: Family

## 2011-10-18 MED ORDER — EZETIMIBE 10 MG PO TABS: 10.0000 mg | ORAL_TABLET | Freq: Every day | ORAL | Status: DC

## 2011-10-18 NOTE — Progress Notes (Signed)
Quick Note:  Pt informed on home VM ______ 

## 2011-10-18 NOTE — Telephone Encounter (Signed)
Done earlier

## 2011-10-18 NOTE — Telephone Encounter (Signed)
FYI only.  Pt wanted me to send Zetia 10 mg to her local pharmacy to see how much 30 pills would cost.  If not too expensive, she will try and if tolerating, will send to mail order

## 2011-10-18 NOTE — Telephone Encounter (Signed)
Patient would like to know her lab results and she also has a question about a medication.  Please call patient

## 2011-10-18 NOTE — Telephone Encounter (Signed)
Start Zetia 10 mg once daily #30 with 5 refills

## 2011-11-05 ENCOUNTER — Encounter: Payer: Self-pay | Admitting: Family Medicine

## 2011-11-05 ENCOUNTER — Ambulatory Visit (INDEPENDENT_AMBULATORY_CARE_PROVIDER_SITE_OTHER): Payer: Medicare Other | Admitting: Family Medicine

## 2011-11-05 VITALS — BP 130/72 | Temp 98.0°F | Wt 136.0 lb

## 2011-11-05 DIAGNOSIS — IMO0001 Reserved for inherently not codable concepts without codable children: Secondary | ICD-10-CM

## 2011-11-05 DIAGNOSIS — R197 Diarrhea, unspecified: Secondary | ICD-10-CM

## 2011-11-05 DIAGNOSIS — M791 Myalgia, unspecified site: Secondary | ICD-10-CM

## 2011-11-05 NOTE — Progress Notes (Signed)
  Subjective:    Patient ID: Deanna [name], female    DOB: 1926/06/21, 78 y.o.   MRN: [id number]  HPI  Patient seen with diarrhea and myalgias since starting Zetia.  She has history of severe hyperlipidemia and already takes Lipitor 40 mg daily. We tried adding on Zetia recently and after several days of taking she started developing some intermittent diarrhea and myalgias and arthralgias. Denies any nausea or vomiting. No abdominal pain. No bloody diarrhea. No recent antibiotics. Good appetite. No objective evidence of joint inflammation such as erythema or warmth.  Past Medical History  Diagnosis Date  . Anxiety   . GERD (gastroesophageal reflux disease)   . Hyperlipidemia   . Hypertension   . History of colon polyps   . Atrial fibrillation   . TRICUSPID REGURGITATION 10/23/2006  . Breast cancer    Past Surgical History  Procedure Date  . Abdominal hysterectomy   . Cataract extraction     left eye  . Bcc-face   . Mastectomy     bilateral    reports that she has never smoked. She does not have any smokeless tobacco history on file. Her alcohol and drug histories not on file. family history includes Breast cancer in her other; Colon cancer in her father and mother; and Hypertension in her mother. Allergies  Allergen Reactions  . Aspirin     REACTION: nervousness---tolerates ibuprofen      Review of Systems  Constitutional: Negative for fever, chills and appetite change.  Respiratory: Negative for shortness of breath.   Cardiovascular: Negative for chest pain.  Gastrointestinal: Positive for diarrhea. Negative for nausea, vomiting, abdominal pain, constipation and blood in stool.  Genitourinary: Negative for dysuria.       Objective:   Physical Exam  Constitutional: She appears well-developed and well-nourished.  Cardiovascular: Normal rate and regular rhythm.   Pulmonary/Chest: Effort normal and breath sounds normal. No respiratory distress. She has no wheezes. She  has no rales.  Abdominal: Soft. There is no tenderness.  Musculoskeletal: She exhibits no edema and no tenderness.          Assessment & Plan:  Diarrhea possibly related to Zetia.  She will discontinue medication this time and be in touch if symptoms persist beyond one-week. She does not appear dehydrated at this point it does not have any other indicators of infectious diarrhea such as fever

## 2011-11-05 NOTE — Patient Instructions (Addendum)
Stop Zetia and be in touch if diarrhea persists after 1-2 weeks.

## 2011-11-07 ENCOUNTER — Other Ambulatory Visit: Payer: Self-pay | Admitting: Family Medicine

## 2011-11-09 NOTE — Telephone Encounter (Signed)
Alprazolam at HS prn last filled 04-23-11, #30 with 5 refills

## 2011-11-09 NOTE — Telephone Encounter (Signed)
Refill for 6 months. 

## 2011-11-09 NOTE — Telephone Encounter (Signed)
done

## 2011-11-27 ENCOUNTER — Encounter: Payer: Medicare Other | Admitting: Family

## 2011-11-29 ENCOUNTER — Ambulatory Visit (INDEPENDENT_AMBULATORY_CARE_PROVIDER_SITE_OTHER): Payer: Medicare Other | Admitting: Family

## 2011-11-29 DIAGNOSIS — I4891 Unspecified atrial fibrillation: Secondary | ICD-10-CM

## 2011-11-29 LAB — POCT INR: INR: 2.8

## 2011-12-07 ENCOUNTER — Encounter: Payer: Self-pay | Admitting: Family Medicine

## 2011-12-07 ENCOUNTER — Ambulatory Visit (INDEPENDENT_AMBULATORY_CARE_PROVIDER_SITE_OTHER): Payer: Medicare Other | Admitting: Family Medicine

## 2011-12-07 VITALS — BP 172/90 | HR 87 | Temp 97.5°F | Wt 136.0 lb

## 2011-12-07 DIAGNOSIS — M25561 Pain in right knee: Secondary | ICD-10-CM

## 2011-12-07 DIAGNOSIS — M25569 Pain in unspecified knee: Secondary | ICD-10-CM

## 2011-12-07 MED ORDER — TRAMADOL HCL 50 MG PO TABS: 50.0000 mg | ORAL_TABLET | Freq: Four times a day (QID) | ORAL | Status: DC | PRN
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
## 2012-01-03 ENCOUNTER — Other Ambulatory Visit: Payer: Self-pay | Admitting: Family

## 2012-01-07 ENCOUNTER — Ambulatory Visit (INDEPENDENT_AMBULATORY_CARE_PROVIDER_SITE_OTHER): Payer: Medicare Other | Admitting: Family

## 2012-01-07 DIAGNOSIS — I4891 Unspecified atrial fibrillation: Secondary | ICD-10-CM

## 2012-01-07 LAB — POCT INR: INR: 2.5

## 2012-01-07 MED ORDER — OMEPRAZOLE 20 MG PO CPDR: 20.0000 mg | DELAYED_RELEASE_CAPSULE | Freq: Every day | ORAL | Status: DC

## 2012-01-07 MED ORDER — LISINOPRIL 20 MG PO TABS: 20.0000 mg | ORAL_TABLET | Freq: Two times a day (BID) | ORAL | Status: DC

## 2012-01-07 NOTE — Patient Instructions (Addendum)
Same dose.None on mondays,wednesdays and Fridays then take 2.5mg  every Sunday Tuesday Thursday and Saturday. Recheck 6 weeks.    Latest dosing instructions   Total Sun Mon Tue Wed Thu Fri Sat   10 2.5 mg Hold 2.5 mg Hold 2.5 mg Hold 2.5 mg    (5 mg0.5) Hold (5 mg0.5) Hold (5 mg0.5) Hold (5 mg0.5)

## 2012-01-16 ENCOUNTER — Encounter: Payer: Self-pay | Admitting: Internal Medicine

## 2012-01-16 ENCOUNTER — Ambulatory Visit (INDEPENDENT_AMBULATORY_CARE_PROVIDER_SITE_OTHER): Payer: Medicare Other | Admitting: Internal Medicine

## 2012-01-16 VITALS — BP 112/74 | Temp 98.1°F | Wt 136.0 lb

## 2012-01-16 DIAGNOSIS — J069 Acute upper respiratory infection, unspecified: Secondary | ICD-10-CM | POA: Insufficient documentation

## 2012-01-16 NOTE — Patient Instructions (Signed)
Gargle with warm salt water twice daily as directed Use nasal saline 3-4 times a day Use mucinex over the counter twice daily as needed Hold your evening dose of lisinopril tonight.  Increase your fluid intake. Please call our office if your symptoms do not improve or gets worse.

## 2012-01-16 NOTE — Assessment & Plan Note (Addendum)
79 year old white female with signs and symptoms of viral URI. Patient advised to continue symptomatic treatment. She was advised discontinue Mucinex DM as I suspect dextromethorphan is causing dizziness. Patient advised to continue Mucinex without dextromethorphan twice daily as needed.  Patient advised to call office if symptoms persist or worsen.

## 2012-01-16 NOTE — Progress Notes (Signed)
Subjective:    Patient ID: Deanna [name], female    DOB: 1926/03/09, 79 y.o.   MRN: [id number]  HPI  79 year old white female with history of hypertension, hyperlipidemia, atrial fibrillation on anticoagulation complains of upper respiratory symptoms x 1 week. She complains of sore throat, cough and sinus congestion. She also feels like she has a slight lump in her throat. She has to frequently clear her throat.   Review of Systems Negative for fever or shortness of breath  Past Medical History  Diagnosis Date  . Anxiety   . GERD (gastroesophageal reflux disease)   . Hyperlipidemia   . Hypertension   . History of colon polyps   . Atrial fibrillation   . TRICUSPID REGURGITATION 10/23/2006  . Breast cancer     History   Social History  . Marital Status: Married    Spouse Name: N/A    Number of Children: N/A  . Years of Education: N/A   Occupational History  . Not on file.   Social History Main Topics  . Smoking status: Never Smoker   . Smokeless tobacco: Not on file  . Alcohol Use: Not on file  . Drug Use: Not on file  . Sexually Active: Not on file   Other Topics Concern  . Not on file   Social History Narrative  . No narrative on file    Past Surgical History  Procedure Date  . Abdominal hysterectomy   . Cataract extraction     left eye  . Bcc-face   . Mastectomy     bilateral    Family History  Problem Relation Age of Onset  . Colon cancer Mother   . Hypertension Mother   . Colon cancer Father   . Breast cancer Other     Allergies  Allergen Reactions  . Aspirin     REACTION: nervousness---tolerates ibuprofen    Current Outpatient Prescriptions on File Prior to Visit  Medication Sig Dispense Refill  . allopurinol (ZYLOPRIM) 300 MG tablet Take 1 tablet (300 mg total) by mouth daily.  90 tablet  3  . ALPRAZolam (XANAX) 0.5 MG tablet TAKE 1 TABLET BY MOUTH AT BEDTIME AS NEEDED  30 tablet  5  . atorvastatin (LIPITOR) 40 MG tablet Take 1 tablet  by mouth at  bedtime  90 tablet  1  . Carboxymethylcellul-Glycerin (OPTIVE) 0.5-0.9 % SOLN Apply to eye daily as needed.        . digoxin (LANOXIN) 0.125 MG tablet Take 1 tablet (0.125 mg total) by mouth daily.  90 tablet  3  . ezetimibe (ZETIA) 10 MG tablet Take 1 tablet (10 mg total) by mouth daily.  30 tablet  2  . fenofibrate 160 MG tablet Take 1 tablet (160 mg total) by mouth daily.  90 tablet  3  . lisinopril (PRINIVIL,ZESTRIL) 20 MG tablet Take 1 tablet (20 mg total) by mouth 2 (two) times daily.  180 tablet  1  . loratadine (CLARITIN) 10 MG tablet Take 10 mg by mouth daily.        . Multiple Vitamin (MULTIVITAMIN) tablet Take 1 tablet by mouth daily.        Marland Kitchen omeprazole (PRILOSEC) 20 MG capsule Take 1 capsule (20 mg total) by mouth daily.  90 capsule  1  . spironolactone (ALDACTONE) 50 MG tablet Take 1 tablet by mouth  daily  90 tablet  1  . traMADol (ULTRAM) 50 MG tablet Take 1 tablet (50 mg total) by mouth every 6 (  six) hours as needed for pain.  60 tablet  0  . warfarin (COUMADIN) 5 MG tablet TAKE 1 TABLET ONCE DAILY OR AS DIRECTED  60 tablet  0    BP 112/74  Temp 98.1 F (36.7 C) (Oral)  Wt 136 lb (61.689 kg)     Objective:   Physical Exam  Constitutional: She appears well-developed and well-nourished.  HENT:  Head: Normocephalic and atraumatic.  Left Ear: External ear normal.       Right ear cerumen  Neck: Neck supple.       No neck tenderness  Cardiovascular: Normal rate, regular rhythm and normal heart sounds.   Pulmonary/Chest: Effort normal and breath sounds normal. She has no wheezes.          Assessment & Plan:

## 2012-01-17 ENCOUNTER — Ambulatory Visit: Payer: Medicare Other | Admitting: Family Medicine

## 2012-02-01 ENCOUNTER — Telehealth: Payer: Self-pay | Admitting: Family Medicine

## 2012-02-01 ENCOUNTER — Encounter: Payer: Self-pay | Admitting: Family Medicine

## 2012-02-01 ENCOUNTER — Ambulatory Visit (INDEPENDENT_AMBULATORY_CARE_PROVIDER_SITE_OTHER): Payer: Medicare Other | Admitting: Family Medicine

## 2012-02-01 VITALS — BP 130/70 | HR 83 | Temp 97.7°F | Wt 135.0 lb

## 2012-02-01 DIAGNOSIS — M1990 Unspecified osteoarthritis, unspecified site: Secondary | ICD-10-CM

## 2012-02-01 NOTE — Telephone Encounter (Signed)
Patient Information:  Caller Name: Mccartney  Phone: [phone]  Patient: Deanna [name], Deanna [name]  Gender: Female  DOB: 12-21-1926  Age: 79 Years  PCP: Evelena Peat (Family Practice)  Office Follow Up:  Does the office need to follow up with this patient?: No  Instructions For The Office: N/A   Symptoms  Reason For Call & Symptoms: Having pain in her right hand for a week.  No known injury.  Very minimal swelling.  Has difficulty using the hand, especially in her thumb.  Reviewed Health History In EMR: Yes  Reviewed Medications In EMR: Yes  Reviewed Allergies In EMR: Yes  Reviewed Surgeries / Procedures: Yes  Date of Onset of Symptoms: 01/25/2012  Treatments Tried: Tramadol prn  Treatments Tried Worked: No  Guideline(s) Used:  Hand and Wrist Pain  Disposition Per Guideline:   See Within 3 Days in Office  Reason For Disposition Reached:   Moderate pain (e.g., interferes with normal activities) and present > 3 days  Advice Given:  N/A  Appointment Scheduled:  02/01/2012 15:15:00 Appointment Scheduled Provider:  Gershon Crane [facility])

## 2012-02-01 NOTE — Progress Notes (Signed)
  Subjective:    Patient ID: Deanna [name], female    DOB: [birth year], 79 y.o.   MRN: [id number]  HPI Here for one week of pain in the right thumb. No swelling or redness or warmth. No recent trauma. She has osteoarthritis in other joints. She asks if it is okay to use Tramadol for this. She also has been diagnosed with gout in the past.    Review of Systems  Constitutional: Negative.   Musculoskeletal: Positive for arthralgias. Negative for myalgias, back pain, joint swelling and gait problem.       Objective:   Physical Exam  Constitutional: She appears well-developed and well-nourished.  Musculoskeletal:       She is quite tender over the right first [facility] joint with reduced ROM and some crepitus. She has tenderness and synovial swelling over most of the DIPs and PIPs on both hands           Assessment & Plan:  This is osteoarthritis and not gout. Try soaking the hand in a pan of hot water and Epsom salts. Use tramadol prn

## 2012-02-06 ENCOUNTER — Encounter: Payer: Self-pay | Admitting: Gastroenterology

## 2012-02-18 ENCOUNTER — Encounter: Payer: Medicare Other | Admitting: Family

## 2012-02-19 ENCOUNTER — Ambulatory Visit (INDEPENDENT_AMBULATORY_CARE_PROVIDER_SITE_OTHER): Payer: Medicare Other | Admitting: Family

## 2012-02-19 DIAGNOSIS — I4891 Unspecified atrial fibrillation: Secondary | ICD-10-CM

## 2012-02-19 LAB — POCT INR: INR: 2.2

## 2012-02-19 NOTE — Patient Instructions (Addendum)
Same dose.None on mondays,wednesdays and Fridays then take 2.5mg  every Sunday Tuesday Thursday and Saturday. Recheck 6 weeks.  Anticoagulation Dose Instructions as of 02/19/2012     Glynis Smiles Tue Wed Thu Fri Sat   New Dose 2.5 mg Hold 2.5 mg Hold 2.5 mg Hold 2.5 mg    Description       Same dose.None on mondays,wednesdays and Fridays then take 2.5mg  every Sunday Tuesday Thursday and Saturday. Recheck 6 weeks.

## 2012-03-04 ENCOUNTER — Encounter: Payer: Self-pay | Admitting: Gastroenterology

## 2012-03-20 ENCOUNTER — Encounter: Payer: Self-pay | Admitting: *Deleted

## 2012-03-21 ENCOUNTER — Telehealth: Payer: Self-pay | Admitting: Family Medicine

## 2012-03-21 DIAGNOSIS — M109 Gout, unspecified: Secondary | ICD-10-CM

## 2012-03-21 DIAGNOSIS — I4891 Unspecified atrial fibrillation: Secondary | ICD-10-CM

## 2012-03-21 DIAGNOSIS — E785 Hyperlipidemia, unspecified: Secondary | ICD-10-CM

## 2012-03-21 NOTE — Telephone Encounter (Signed)
Patient came in stating that she need refills of her digoxin 0.125mg  1poqd, allopurinol 300mg  1poqd, abnd fenofibrate 160mg  1poqd [facility]. Please assist and inform patient when done.

## 2012-03-24 MED ORDER — FENOFIBRATE 160 MG PO TABS: 160.0000 mg | ORAL_TABLET | Freq: Every day | ORAL | Status: DC

## 2012-03-24 MED ORDER — DIGOXIN 125 MCG PO TABS: 0.1250 mg | ORAL_TABLET | Freq: Every day | ORAL | Status: DC

## 2012-03-24 MED ORDER — ALLOPURINOL 300 MG PO TABS: 300.0000 mg | ORAL_TABLET | Freq: Every day | ORAL | Status: DC

## 2012-03-24 NOTE — Telephone Encounter (Signed)
Pt informed

## 2012-03-25 ENCOUNTER — Ambulatory Visit: Payer: Medicare Other | Admitting: Gastroenterology

## 2012-04-01 ENCOUNTER — Ambulatory Visit (INDEPENDENT_AMBULATORY_CARE_PROVIDER_SITE_OTHER): Payer: Medicare Other | Admitting: Family

## 2012-04-01 DIAGNOSIS — I4891 Unspecified atrial fibrillation: Secondary | ICD-10-CM

## 2012-04-01 LAB — POCT INR: INR: 3.2

## 2012-04-01 NOTE — Patient Instructions (Addendum)
Hold coumadin Thursday. Then continue Same dose.None on mondays,wednesdays and Fridays then take 2.5mg  every Sunday Tuesday Thursday and Saturday. Recheck 4 weeks.  Anticoagulation Dose Instructions as of 04/01/2012     Deanna [name] Tue Wed Thu Fri Sat   New Dose 2.5 mg Hold 2.5 mg Hold 2.5 mg Hold 2.5 mg    Description       Hold coumadin Thursday. Then continue Same dose.None on mondays,wednesdays and Fridays then take 2.5mg  every Sunday Tuesday Thursday and Saturday. Recheck 4 weeks.

## 2012-04-07 ENCOUNTER — Encounter: Payer: Self-pay | Admitting: Gastroenterology

## 2012-04-07 ENCOUNTER — Ambulatory Visit (INDEPENDENT_AMBULATORY_CARE_PROVIDER_SITE_OTHER): Payer: Medicare Other | Admitting: Gastroenterology

## 2012-04-07 VITALS — BP 128/78 | HR 96 | Ht 62.0 in | Wt 136.4 lb

## 2012-04-07 DIAGNOSIS — K2270 Barrett's esophagus without dysplasia: Secondary | ICD-10-CM | POA: Insufficient documentation

## 2012-04-07 DIAGNOSIS — Z860100 Personal history of colon polyps, unspecified: Secondary | ICD-10-CM | POA: Insufficient documentation

## 2012-04-07 NOTE — Patient Instructions (Addendum)
Follow up as needed

## 2012-04-07 NOTE — Assessment & Plan Note (Addendum)
While Barrett's esophagus is listed amongst the patient's past medical history, I cannot find any documentation for this.  At last endoscopy in 2006 biopsies did not reveal Barrett's esophagus.

## 2012-04-07 NOTE — Assessment & Plan Note (Signed)
I explained that we generally stop colorectal cancer screening around age 79. I also explained that there is a slightly increased risk for stroke in patient's with atrial fibrillation upon holding the Coumadin. Accordingly, I feel that at this time risks of colonoscopy and holding Coumadin outweigh the benefit.  Patient and daughter are agreeable to pursuing colonoscopy.

## 2012-04-07 NOTE — Progress Notes (Signed)
History of Present Illness: Pleasant 79 year old white female with history of adenomatous colon polyps, atrial fibrillation, on Coumadin, Barrett's esophagus, here for recall colonoscopy.  She has no GI complaints including dysphagia, pyrosis, abdominal pain, rectal bleeding or change in bowel habits. Family history is pertinent for both parents who had colon cancer in their 31s.  In 2009 a 4 mm adenomatous polyp was removed.    Past Medical History  Diagnosis Date  . Anxiety   . GERD (gastroesophageal reflux disease)   . Hyperlipidemia   . Hypertension   . Atrial fibrillation   . TRICUSPID REGURGITATION 10/23/2006  . Breast cancer   . Colon polyp 2009    TUBULAR ADENOMA  . Diverticulosis of colon (without mention of hemorrhage) 2009  . Barrett's esophagus   . Arthritis   . Status post dilation of esophageal narrowing    Past Surgical History  Procedure Laterality Date  . Abdominal hysterectomy    . Cataract extraction Bilateral   . Bcc-face    . Mastectomy Bilateral    family history includes Breast cancer in her other; Colon cancer in her father and mother; Colon polyps in her son; and Hypertension in her mother. Current Outpatient Prescriptions  Medication Sig Dispense Refill  . allopurinol (ZYLOPRIM) 300 MG tablet Take 1 tablet (300 mg total) by mouth daily.  90 tablet  3  . ALPRAZolam (XANAX) 0.5 MG tablet TAKE 1 TABLET BY MOUTH AT BEDTIME AS NEEDED  30 tablet  5  . atorvastatin (LIPITOR) 40 MG tablet Take 1 tablet by mouth at  bedtime  90 tablet  1  . Carboxymethylcellul-Glycerin (OPTIVE) 0.5-0.9 % SOLN Apply to eye daily as needed.        . digoxin (LANOXIN) 0.125 MG tablet Take 1 tablet (0.125 mg total) by mouth daily.  90 tablet  3  . fenofibrate 160 MG tablet Take 1 tablet (160 mg total) by mouth daily.  90 tablet  3  . lisinopril (PRINIVIL,ZESTRIL) 20 MG tablet Take 1 tablet (20 mg total) by mouth 2 (two) times daily.  180 tablet  1  . loratadine (CLARITIN) 10 MG  tablet Take 10 mg by mouth daily.        . Multiple Vitamin (MULTIVITAMIN) tablet Take 1 tablet by mouth daily.        Marland Kitchen omeprazole (PRILOSEC) 20 MG capsule Take 1 capsule (20 mg total) by mouth daily.  90 capsule  1  . spironolactone (ALDACTONE) 50 MG tablet Take 1 tablet by mouth  daily  90 tablet  1  . traMADol (ULTRAM) 50 MG tablet Take 1 tablet (50 mg total) by mouth every 6 (six) hours as needed for pain.  60 tablet  0  . warfarin (COUMADIN) 5 MG tablet        No current facility-administered medications for this visit.   Allergies as of 04/07/2012 - Review Complete 04/07/2012  Allergen Reaction Noted  . Aspirin  08/19/2006    reports that she has never smoked. She has never used smokeless tobacco. She reports that she drinks about 2.5 ounces of alcohol per week. She reports that she does not use illicit drugs.     Review of Systems: Pertinent positive and negative review of systems were noted in the above HPI section. All other review of systems were otherwise negative.  Vital signs were reviewed in today's medical record Physical Exam: General: Well developed , well nourished, no acute distress Skin: anicteric Head: Normocephalic and atraumatic Eyes:  sclerae anicteric,  EOMI Ears: Normal auditory acuity Mouth: No deformity or lesions Neck: Supple, no masses or thyromegaly Lungs: Clear throughout to auscultation Heart: Regular rate and rhythm; no murmurs, rubs or bruits Abdomen: Soft, non tender and non distended. No masses, hepatosplenomegaly or hernias noted. Normal Bowel sounds Rectal:deferred Musculoskeletal: Symmetrical with no gross deformities  Skin: No lesions on visible extremities Pulses:  Normal pulses noted Extremities: No clubbing, cyanosis, edema or deformities noted Neurological: Alert oriented x 4, grossly nonfocal Cervical Nodes:  No significant cervical adenopathy Inguinal Nodes: No significant inguinal adenopathy Psychological:  Alert and cooperative.  Normal mood and affect

## 2012-04-15 ENCOUNTER — Encounter: Payer: Medicare Other | Admitting: Family

## 2012-04-15 ENCOUNTER — Encounter: Payer: Self-pay | Admitting: Family Medicine

## 2012-04-15 ENCOUNTER — Ambulatory Visit (INDEPENDENT_AMBULATORY_CARE_PROVIDER_SITE_OTHER): Payer: Medicare Other | Admitting: Family Medicine

## 2012-04-15 VITALS — BP 140/80 | Temp 98.0°F | Wt 135.0 lb

## 2012-04-15 DIAGNOSIS — Z79899 Other long term (current) drug therapy: Secondary | ICD-10-CM

## 2012-04-15 DIAGNOSIS — E785 Hyperlipidemia, unspecified: Secondary | ICD-10-CM

## 2012-04-15 DIAGNOSIS — I4891 Unspecified atrial fibrillation: Secondary | ICD-10-CM

## 2012-04-15 DIAGNOSIS — I10 Essential (primary) hypertension: Secondary | ICD-10-CM

## 2012-04-15 LAB — CBC WITH DIFFERENTIAL/PLATELET
Basophils Absolute: 0.1 10*3/uL (ref 0.0–0.1)
Basophils Relative: 0.8 % (ref 0.0–3.0)
Eosinophils Absolute: 0.2 10*3/uL (ref 0.0–0.7)
Eosinophils Relative: 2.6 % (ref 0.0–5.0)
HCT: 42.6 % (ref 36.0–46.0)
Hemoglobin: 14.4 g/dL (ref 12.0–15.0)
Lymphocytes Relative: 23.8 % (ref 12.0–46.0)
Lymphs Abs: 1.9 10*3/uL (ref 0.7–4.0)
MCHC: 33.8 g/dL (ref 30.0–36.0)
MCV: 93.7 fl (ref 78.0–100.0)
Monocytes Absolute: 0.8 10*3/uL (ref 0.1–1.0)
Monocytes Relative: 9.9 % (ref 3.0–12.0)
Neutro Abs: 5 10*3/uL (ref 1.4–7.7)
Neutrophils Relative %: 62.9 % (ref 43.0–77.0)
Platelets: 324 10*3/uL (ref 150.0–400.0)
RBC: 4.55 Mil/uL (ref 3.87–5.11)
RDW: 15.2 % — ABNORMAL HIGH (ref 11.5–14.6)
WBC: 7.9 10*3/uL (ref 4.5–10.5)

## 2012-04-15 LAB — BASIC METABOLIC PANEL
BUN: 20 mg/dL (ref 6–23)
CO2: 29 mEq/L (ref 19–32)
Calcium: 9.9 mg/dL (ref 8.4–10.5)
Chloride: 99 mEq/L (ref 96–112)
Creatinine, Ser: 1.2 mg/dL (ref 0.4–1.2)
GFR: 47.81 mL/min — ABNORMAL LOW (ref >60.00)
Glucose, Bld: 107 mg/dL — ABNORMAL HIGH (ref 70–99)
Potassium: 4.4 mEq/L (ref 3.5–5.1)
Sodium: 139 mEq/L (ref 135–145)

## 2012-04-15 MED ORDER — SPIRONOLACTONE 50 MG PO TABS: ORAL_TABLET | ORAL | Status: DC

## 2012-04-15 MED ORDER — ATORVASTATIN CALCIUM 40 MG PO TABS: ORAL_TABLET | ORAL | Status: DC

## 2012-04-15 NOTE — Progress Notes (Signed)
  Subjective:    Patient ID: Deanna [name], female    DOB: [birth year], 79 y.o.   MRN: [id number]  HPI  Medical followup Patient has history of hypertension, atrial fibrillation, GERD, chronic kidney disease with baseline creatinine of 1.4, and osteoarthritis. She remains on Coumadin. No recent bleeding complications. Coumadin followed in the clinic here. Recent gastroenterology visit. Decision not to pursue any further colonoscopies given her age and risk of holding Coumadin.  Patient has no specific complaints today. Denies any recent dizziness or chest pains. Compliant with all medications. She has for several years been taking spirinolactone and ACE inhibitor and has not had any recent hyperkalemia. Electrolytes need to be followed closely with some renal insufficiency as above  Past Medical History  Diagnosis Date  . Anxiety   . GERD (gastroesophageal reflux disease)   . Hyperlipidemia   . Hypertension   . Atrial fibrillation   . TRICUSPID REGURGITATION 10/23/2006  . Breast cancer   . Colon polyp 2009    TUBULAR ADENOMA  . Diverticulosis of colon (without mention of hemorrhage) 2009  . Barrett's esophagus   . Arthritis   . Status post dilation of esophageal narrowing    Past Surgical History  Procedure Laterality Date  . Abdominal hysterectomy    . Cataract extraction Bilateral   . Bcc-face    . Mastectomy Bilateral     reports that she has never smoked. She has never used smokeless tobacco. She reports that she drinks about 2.5 ounces of alcohol per week. She reports that she does not use illicit drugs. family history includes Breast cancer in her other; Colon cancer in her father and mother; Colon polyps in her son; and Hypertension in her mother. Allergies  Allergen Reactions  . Aspirin     REACTION: nervousness---tolerates ibuprofen    Review of Systems  Constitutional: Negative for fatigue.  Eyes: Negative for visual disturbance.  Respiratory: Negative for  cough, chest tightness, shortness of breath and wheezing.   Cardiovascular: Negative for chest pain, palpitations and leg swelling.  Neurological: Negative for dizziness, seizures, syncope, weakness, light-headedness and headaches.       Objective:   Physical Exam  Constitutional: She appears well-developed and well-nourished.  HENT:  Right Ear: External ear normal.  Left Ear: External ear normal.  Mouth/Throat: Oropharynx is clear and moist.  Neck: Neck supple.  Cardiovascular: Normal rate.   Slightly irregular heart rhythm  Pulmonary/Chest: Effort normal and breath sounds normal. No respiratory distress. She has no wheezes. She has no rales.  Musculoskeletal: She exhibits no edema.  Psychiatric: She has a normal mood and affect. Her behavior is normal.          Assessment & Plan:  #1 hypertension. Stable. Check basic metabolic panel  #2 history of atrial fibrillation. Rate controlled. Continue Coumadin. #3 hyperlipidemia. She remains on statin. Never started zetia secondary to cost. She also remains on fenofibrate 160 mg daily. Repeat lipids in 6 months.

## 2012-04-16 NOTE — Progress Notes (Signed)
Quick Note:  Pt informed ______ 

## 2012-04-29 ENCOUNTER — Encounter: Payer: Medicare Other | Admitting: Family

## 2012-05-01 ENCOUNTER — Ambulatory Visit (INDEPENDENT_AMBULATORY_CARE_PROVIDER_SITE_OTHER): Payer: Medicare Other | Admitting: Family

## 2012-05-01 DIAGNOSIS — I4891 Unspecified atrial fibrillation: Secondary | ICD-10-CM

## 2012-05-01 LAB — POCT INR: INR: 1.9

## 2012-05-01 NOTE — Patient Instructions (Addendum)
Take 1 tab today only. Same dose.None on mondays,wednesdays and Fridays then take 2.5mg  every Sunday Tuesday Thursday and Saturday. Recheck 4 weeks.  Anticoagulation Dose Instructions as of 05/01/2012     Deanna [name] Tue Wed Thu Fri Sat   New Dose 2.5 mg Hold 2.5 mg Hold 2.5 mg Hold 2.5 mg    Description       Take 1 tab today only. Same dose.None on mondays,wednesdays and Fridays then take 2.5mg  every Sunday Tuesday Thursday and Saturday. Recheck 4 weeks.

## 2012-05-05 ENCOUNTER — Other Ambulatory Visit: Payer: Self-pay | Admitting: Family Medicine

## 2012-05-06 NOTE — Telephone Encounter (Signed)
Alprazolam 0.5 at HS PRN last filled 11-07-11, #30 with 5 refills

## 2012-05-07 NOTE — Telephone Encounter (Signed)
Refill for 6 months. 

## 2012-05-07 NOTE — Telephone Encounter (Signed)
Already answered  

## 2012-05-07 NOTE — Telephone Encounter (Signed)
alpraxolam at HS PRN last filled 11-07-11, #30 with 5 refills Pt just had a 6 month visit in March

## 2012-06-05 ENCOUNTER — Ambulatory Visit (INDEPENDENT_AMBULATORY_CARE_PROVIDER_SITE_OTHER): Payer: Medicare Other | Admitting: Family

## 2012-06-05 DIAGNOSIS — I4891 Unspecified atrial fibrillation: Secondary | ICD-10-CM

## 2012-06-05 LAB — POCT INR: INR: 2

## 2012-06-05 NOTE — Patient Instructions (Addendum)
Same dose.None on mondays,wednesdays and Fridays then take 2.5mg  every Sunday Tuesday Thursday and Saturday. Recheck 4 weeks.  Anticoagulation Dose Instructions as of 06/05/2012     Glynis Smiles Tue Wed Thu Fri Sat   New Dose 2.5 mg Hold 2.5 mg Hold 2.5 mg Hold 2.5 mg    Description       Same dose.None on mondays,wednesdays and Fridays then take 2.5mg  every Sunday Tuesday Thursday and Saturday. Recheck 4 weeks.

## 2012-07-10 ENCOUNTER — Ambulatory Visit (INDEPENDENT_AMBULATORY_CARE_PROVIDER_SITE_OTHER): Payer: Medicare Other | Admitting: Family

## 2012-07-10 DIAGNOSIS — I4891 Unspecified atrial fibrillation: Secondary | ICD-10-CM

## 2012-07-10 LAB — POCT INR: INR: 2.2

## 2012-07-10 NOTE — Patient Instructions (Addendum)
Same dose.None on mondays,wednesdays and Fridays then take 2.5mg  every Sunday Tuesday Thursday and Saturday. Recheck 4 weeks.  Anticoagulation Dose Instructions as of 07/10/2012     Deanna [name] Tue Wed Thu Fri Sat   New Dose 2.5 mg Hold 2.5 mg Hold 2.5 mg Hold 2.5 mg    Description       Same dose.None on mondays,wednesdays and Fridays then take 2.5mg  every Sunday Tuesday Thursday and Saturday. Recheck 4 weeks.

## 2012-07-22 ENCOUNTER — Other Ambulatory Visit: Payer: Self-pay | Admitting: Family

## 2012-07-23 ENCOUNTER — Other Ambulatory Visit: Payer: Self-pay

## 2012-07-23 MED ORDER — WARFARIN SODIUM 5 MG PO TABS: 2.5000 mg | ORAL_TABLET | Freq: Every day | ORAL | Status: DC

## 2012-08-11 ENCOUNTER — Other Ambulatory Visit: Payer: Self-pay | Admitting: Family

## 2012-08-21 ENCOUNTER — Ambulatory Visit: Payer: Medicare Other

## 2012-08-28 ENCOUNTER — Ambulatory Visit (INDEPENDENT_AMBULATORY_CARE_PROVIDER_SITE_OTHER): Payer: Medicare Other | Admitting: General Practice

## 2012-08-28 DIAGNOSIS — I4891 Unspecified atrial fibrillation: Secondary | ICD-10-CM

## 2012-08-28 DIAGNOSIS — Z7901 Long term (current) use of anticoagulants: Secondary | ICD-10-CM

## 2012-08-28 LAB — POCT INR: INR: 3.1

## 2012-09-02 ENCOUNTER — Other Ambulatory Visit: Payer: Self-pay | Admitting: Family Medicine

## 2012-09-02 NOTE — Telephone Encounter (Signed)
Last visit 04/15/12 Last refill 12/07/11 #60 no refill

## 2012-09-02 NOTE — Telephone Encounter (Signed)
Refill once OK. 

## 2012-09-03 ENCOUNTER — Encounter: Payer: Self-pay | Admitting: Family Medicine

## 2012-09-03 ENCOUNTER — Ambulatory Visit (INDEPENDENT_AMBULATORY_CARE_PROVIDER_SITE_OTHER): Payer: Medicare Other | Admitting: Family Medicine

## 2012-09-03 VITALS — BP 132/70 | HR 84 | Temp 97.6°F | Wt 135.0 lb

## 2012-09-03 DIAGNOSIS — M545 Low back pain, unspecified: Secondary | ICD-10-CM

## 2012-09-03 NOTE — Progress Notes (Signed)
  Subjective:    Patient ID: Deanna [name], female    DOB: [birth year], 79 y.o.   MRN: [id number]  HPI Acute visit for low back pain Onset about 5 days ago. No specific injury. Possibly occurred after some gardening She had some pain which is described as an achy sensation left lumbar back occasional radiation toward knee. No numbness or weakness. No loss of urine or stool control. Pain initially was 8/10 severity and now almost gone completely. She took some Ultram which helped. No dysuria. No fevers or chills. No appetite or weight changes.  Past Medical History  Diagnosis Date  . Anxiety   . GERD (gastroesophageal reflux disease)   . Hyperlipidemia   . Hypertension   . Atrial fibrillation   . TRICUSPID REGURGITATION 10/23/2006  . Breast cancer   . Colon polyp 2009    TUBULAR ADENOMA  . Diverticulosis of colon (without mention of hemorrhage) 2009  . Barrett's esophagus   . Arthritis   . Status post dilation of esophageal narrowing    Past Surgical History  Procedure Laterality Date  . Abdominal hysterectomy    . Cataract extraction Bilateral   . Bcc-face    . Mastectomy Bilateral     reports that she has never smoked. She has never used smokeless tobacco. She reports that she drinks about 2.5 ounces of alcohol per week. She reports that she does not use illicit drugs. family history includes Breast cancer in her other; Colon cancer in her father and mother; Colon polyps in her son; Hypertension in her mother. Allergies  Allergen Reactions  . Aspirin     REACTION: nervousness---tolerates ibuprofen      Review of Systems  Constitutional: Negative for fever, chills, appetite change and unexpected weight change.  Gastrointestinal: Negative for abdominal pain.  Genitourinary: Negative for dysuria.  Neurological: Negative for weakness and numbness.       Objective:   Physical Exam  Constitutional: She appears well-developed and well-nourished.  Cardiovascular: Normal  rate and regular rhythm.   Pulmonary/Chest: Effort normal and breath sounds normal. No respiratory distress. She has no wheezes. She has no rales.  Musculoskeletal: She exhibits no edema.  Straight leg raise are negative  Neurological:  Deep tender reflexes symmetric knee and ankle bilaterally. Full-strength lower extremities. Normal sensory function. Patient able to flex back 90 and extend 15          Assessment & Plan:  Lumbar back strain. She describes some left lumbar radiculopathy symptoms which are now essentially resolved. Continue tramadol as needed. Touch base if she has any recurrent pain or numbness or weakness.

## 2012-09-03 NOTE — Patient Instructions (Addendum)

## 2012-10-08 LAB — POCT INR: INR: 3.8

## 2012-10-09 ENCOUNTER — Ambulatory Visit: Payer: Medicare Other

## 2012-10-15 ENCOUNTER — Ambulatory Visit: Payer: Medicare Other | Admitting: Family Medicine

## 2012-10-15 ENCOUNTER — Other Ambulatory Visit: Payer: Self-pay

## 2012-10-15 ENCOUNTER — Encounter: Payer: Self-pay | Admitting: Family Medicine

## 2012-10-15 ENCOUNTER — Ambulatory Visit (INDEPENDENT_AMBULATORY_CARE_PROVIDER_SITE_OTHER): Payer: Medicare Other | Admitting: Family

## 2012-10-15 ENCOUNTER — Ambulatory Visit (INDEPENDENT_AMBULATORY_CARE_PROVIDER_SITE_OTHER): Payer: Medicare Other | Admitting: Family Medicine

## 2012-10-15 ENCOUNTER — Other Ambulatory Visit: Payer: Medicare Other

## 2012-10-15 VITALS — BP 130/78 | HR 76 | Temp 97.7°F | Wt 137.0 lb

## 2012-10-15 DIAGNOSIS — Z23 Encounter for immunization: Secondary | ICD-10-CM

## 2012-10-15 DIAGNOSIS — I4891 Unspecified atrial fibrillation: Secondary | ICD-10-CM

## 2012-10-15 DIAGNOSIS — Z7901 Long term (current) use of anticoagulants: Secondary | ICD-10-CM

## 2012-10-15 DIAGNOSIS — E785 Hyperlipidemia, unspecified: Secondary | ICD-10-CM

## 2012-10-15 DIAGNOSIS — R7309 Other abnormal glucose: Secondary | ICD-10-CM

## 2012-10-15 DIAGNOSIS — I10 Essential (primary) hypertension: Secondary | ICD-10-CM

## 2012-10-15 LAB — HEPATIC FUNCTION PANEL
ALT: 22 U/L (ref 0–35)
AST: 30 U/L (ref 0–37)
Albumin: 4.2 g/dL (ref 3.5–5.2)
Alkaline Phosphatase: 40 U/L (ref 39–117)
Bilirubin, Direct: 0.2 mg/dL (ref 0.0–0.3)
Total Bilirubin: 1 mg/dL (ref 0.3–1.2)
Total Protein: 7.4 g/dL (ref 6.0–8.3)

## 2012-10-15 LAB — BASIC METABOLIC PANEL
BUN: 19 mg/dL (ref 6–23)
CO2: 30 mEq/L (ref 19–32)
Calcium: 10.3 mg/dL (ref 8.4–10.5)
Chloride: 104 mEq/L (ref 96–112)
Creatinine, Ser: 1.2 mg/dL (ref 0.4–1.2)
GFR: 47.75 mL/min — ABNORMAL LOW (ref >60.00)
Glucose, Bld: 109 mg/dL — ABNORMAL HIGH (ref 70–99)
Potassium: 4.7 mEq/L (ref 3.5–5.1)
Sodium: 142 mEq/L (ref 135–145)

## 2012-10-15 LAB — LIPID PANEL
Cholesterol: 232 mg/dL — ABNORMAL HIGH (ref 0–200)
HDL: 35.5 mg/dL — ABNORMAL LOW (ref >39.00)
Total CHOL/HDL Ratio: 7
Triglycerides: 622 mg/dL — ABNORMAL HIGH (ref 0.0–149.0)
VLDL: 124.4 mg/dL — ABNORMAL HIGH (ref 0.0–40.0)

## 2012-10-15 LAB — LDL CHOLESTEROL, DIRECT: Direct LDL: 104.7 mg/dL

## 2012-10-15 MED ORDER — WARFARIN SODIUM 2.5 MG PO TABS: 2.5000 mg | ORAL_TABLET | Freq: Every day | ORAL | Status: DC

## 2012-10-15 MED ORDER — LISINOPRIL 20 MG PO TABS: 20.0000 mg | ORAL_TABLET | Freq: Two times a day (BID) | ORAL | Status: DC

## 2012-10-15 MED ORDER — ALPRAZOLAM 0.5 MG PO TABS: 0.5000 mg | ORAL_TABLET | Freq: Every evening | ORAL | Status: DC | PRN

## 2012-10-15 NOTE — Progress Notes (Signed)
Subjective:    Patient ID: Deanna [name], female    DOB: [birth year], 79 y.o.   MRN: [id number]  HPI Followup regarding multiple medical problems. She has history of hypertension, atrial fibrillation, hyperlipidemia, GERD with Barrett's esophagus changes, gout, osteoarthritis Medications reviewed. She remains on Coumadin. No recent bleeding complications. Needs repeat INR.  She complains of some insomnia. She has difficulty falling asleep. She does frequently sleep in. No daytime naps. No consistent alcohol use at night. No late day use of caffeine. Frequently watches television at night.  She has history of atrial fibrillation and hypertension. Blood pressure well controlled. No orthostasis. No recent chest pains. Denies any recent falls. Hyperlipidemia treated with Lipitor. She is due for repeat labs. No myalgias. She has some osteoarthritis pains takes tramadol which is working well. No recent active GERD symptoms. No dysphagia.  Past Medical History  Diagnosis Date  . Anxiety   . GERD (gastroesophageal reflux disease)   . Hyperlipidemia   . Hypertension   . Atrial fibrillation   . TRICUSPID REGURGITATION 10/23/2006  . Breast cancer   . Colon polyp 2009    TUBULAR ADENOMA  . Diverticulosis of colon (without mention of hemorrhage) 2009  . Barrett's esophagus   . Arthritis   . Status post dilation of esophageal narrowing    Past Surgical History  Procedure Laterality Date  . Abdominal hysterectomy    . Cataract extraction Bilateral   . Bcc-face    . Mastectomy Bilateral     reports that she has never smoked. She has never used smokeless tobacco. She reports that she drinks about 2.5 ounces of alcohol per week. She reports that she does not use illicit drugs. family history includes Breast cancer in her other; Colon cancer in her father and mother; Colon polyps in her son; Hypertension in her mother. Allergies  Allergen Reactions  . Aspirin     REACTION:  nervousness---tolerates ibuprofen      Review of Systems  Constitutional: Negative for fatigue and unexpected weight change.  Eyes: Negative for visual disturbance.  Respiratory: Negative for cough, chest tightness, shortness of breath and wheezing.   Cardiovascular: Negative for chest pain, palpitations and leg swelling.  Gastrointestinal: Negative for abdominal pain and blood in stool.  Genitourinary: Negative for dysuria.  Neurological: Negative for dizziness, seizures, syncope, weakness, light-headedness and headaches.  Hematological: Does not bruise/bleed easily.       Objective:   Physical Exam  Constitutional: She is oriented to person, place, and time. She appears well-developed and well-nourished.  Neck: Neck supple. No thyromegaly present.  Cardiovascular: Normal rate and regular rhythm.   Pulmonary/Chest: Effort normal and breath sounds normal. No respiratory distress. She has no wheezes. She has no rales.  Musculoskeletal: She exhibits no edema.  Neurological: She is alert and oriented to person, place, and time. No cranial nerve deficit.  Skin: No rash noted.  Psychiatric: She has a normal mood and affect. Her behavior is normal.          Assessment & Plan:  #1 history of atrial fibrillation. Currently appears to be sinus rhythm. Check INR and adjust accordingly #2 hypertension. Adequately controlled #3 history of chronic kidney disease. Recheck basic metabolic panel #4 history of hyperlipidemia. Check lipid and hepatic panel #5 chronic insomnia. Sleep hygiene discussed handout given. Refill alprazolam which has taken for several years. We discussed potential risk of falls with this medication. She is very reluctant to make changes. #6 history of GERD symptomatically stable on omeprazole

## 2012-10-15 NOTE — Patient Instructions (Signed)
Hold coumadin Thursday and Saturday. Resume on Sunday. Continue to take 2.5 mg all days except M/W/F.  Re-check in 6 weeks per patient request.  Anticoagulation Dose Instructions as of 10/15/2012     Deanna [name] Tue Wed Thu Fri Sat   New Dose 2.5 mg Hold 2.5 mg Hold 2.5 mg Hold 2.5 mg    Description       Hold coumadin Thursday and Saturday. Resume on Sunday. Continue to take 2.5 mg all days except M/W/F.  Re-check in 6 weeks per patient request.

## 2012-10-15 NOTE — Patient Instructions (Signed)

## 2012-10-21 ENCOUNTER — Other Ambulatory Visit: Payer: Self-pay | Admitting: Family

## 2012-10-21 MED ORDER — SPIRONOLACTONE 50 MG PO TABS: ORAL_TABLET | ORAL | Status: DC

## 2012-10-21 MED ORDER — WARFARIN SODIUM 2.5 MG PO TABS: 2.5000 mg | ORAL_TABLET | Freq: Every day | ORAL | Status: DC

## 2012-10-21 MED ORDER — OMEPRAZOLE 20 MG PO CPDR: DELAYED_RELEASE_CAPSULE | ORAL | Status: DC

## 2012-11-03 ENCOUNTER — Other Ambulatory Visit: Payer: Self-pay | Admitting: Family Medicine

## 2012-11-13 ENCOUNTER — Ambulatory Visit (INDEPENDENT_AMBULATORY_CARE_PROVIDER_SITE_OTHER): Payer: Medicare Other | Admitting: General Practice

## 2012-11-13 DIAGNOSIS — Z7901 Long term (current) use of anticoagulants: Secondary | ICD-10-CM

## 2012-11-13 DIAGNOSIS — I4891 Unspecified atrial fibrillation: Secondary | ICD-10-CM

## 2012-11-13 LAB — POCT INR: INR: 2.6

## 2012-12-03 ENCOUNTER — Other Ambulatory Visit: Payer: Self-pay | Admitting: Dermatology

## 2012-12-25 ENCOUNTER — Ambulatory Visit (INDEPENDENT_AMBULATORY_CARE_PROVIDER_SITE_OTHER): Payer: Medicare Other | Admitting: General Practice

## 2012-12-25 DIAGNOSIS — I4891 Unspecified atrial fibrillation: Secondary | ICD-10-CM

## 2012-12-25 LAB — POCT INR: INR: 3.2

## 2012-12-25 NOTE — Progress Notes (Signed)
Pre-visit discussion using our clinic review tool. No additional management support is needed unless otherwise documented below in the visit note.  

## 2013-02-02 ENCOUNTER — Ambulatory Visit (INDEPENDENT_AMBULATORY_CARE_PROVIDER_SITE_OTHER): Payer: Medicare Other | Admitting: General Practice

## 2013-02-02 DIAGNOSIS — Z5181 Encounter for therapeutic drug level monitoring: Secondary | ICD-10-CM

## 2013-02-02 DIAGNOSIS — I4891 Unspecified atrial fibrillation: Secondary | ICD-10-CM

## 2013-02-02 LAB — POCT INR: INR: 4.4

## 2013-02-02 NOTE — Progress Notes (Signed)
Pre-visit discussion using our clinic review tool. No additional management support is needed unless otherwise documented below in the visit note.  

## 2013-02-05 ENCOUNTER — Ambulatory Visit: Payer: Medicare Other

## 2013-02-16 ENCOUNTER — Telehealth: Payer: Self-pay | Admitting: Family Medicine

## 2013-02-16 ENCOUNTER — Other Ambulatory Visit: Payer: Self-pay

## 2013-02-16 ENCOUNTER — Ambulatory Visit (INDEPENDENT_AMBULATORY_CARE_PROVIDER_SITE_OTHER): Payer: Medicare Other | Admitting: General Practice

## 2013-02-16 DIAGNOSIS — Z7901 Long term (current) use of anticoagulants: Secondary | ICD-10-CM

## 2013-02-16 DIAGNOSIS — I4891 Unspecified atrial fibrillation: Secondary | ICD-10-CM

## 2013-02-16 LAB — POCT INR: INR: 2.1

## 2013-02-16 NOTE — Telephone Encounter (Signed)
Pt requesting recommendation from PCP regarding what she can do about musle cramps she is getting in her neck and should.  Pt thinks it is related to the Atorvastatin she is taking.  Pt state she is also taking Tramadol and needs to know if it ok for her to take this medication on a regular/long term basis.

## 2013-02-16 NOTE — Telephone Encounter (Signed)
Can take Tramadol long term. We could try changing her Atorvastatin to Crestor 20 mg once daily (though no generic for Crestor).

## 2013-02-16 NOTE — Progress Notes (Signed)
Pre-visit discussion using our clinic review tool. No additional management support is needed unless otherwise documented below in the visit note.  

## 2013-02-17 ENCOUNTER — Telehealth: Payer: Self-pay | Admitting: Family Medicine

## 2013-02-17 MED ORDER — ROSUVASTATIN CALCIUM 20 MG PO TABS: 20.0000 mg | ORAL_TABLET | Freq: Every day | ORAL | Status: DC

## 2013-02-17 NOTE — Telephone Encounter (Signed)
Pt states rosuvastatin (CRESTOR) 20 MG tablet cost too much and would like a new RX.

## 2013-02-17 NOTE — Telephone Encounter (Signed)
Pt daughter is informed and RX sent to pharmacy

## 2013-02-18 MED ORDER — PRAVASTATIN SODIUM 40 MG PO TABS: 40.0000 mg | ORAL_TABLET | Freq: Every day | ORAL | Status: DC

## 2013-02-18 NOTE — Telephone Encounter (Signed)
Pravastatin 40 mg once daily.   

## 2013-02-18 NOTE — Telephone Encounter (Signed)
RX sent to pharmacy and pt aware

## 2013-02-27 ENCOUNTER — Other Ambulatory Visit: Payer: Self-pay | Admitting: Family Medicine

## 2013-02-27 NOTE — Telephone Encounter (Signed)
Last visit 10/15/12 Last refill 09/02/12 #60 0 refill

## 2013-03-01 NOTE — Telephone Encounter (Signed)
Refill OK

## 2013-03-13 ENCOUNTER — Encounter: Payer: Self-pay | Admitting: *Deleted

## 2013-03-16 ENCOUNTER — Other Ambulatory Visit: Payer: Self-pay

## 2013-03-16 ENCOUNTER — Ambulatory Visit (INDEPENDENT_AMBULATORY_CARE_PROVIDER_SITE_OTHER): Payer: Medicare Other | Admitting: General Practice

## 2013-03-16 ENCOUNTER — Other Ambulatory Visit: Payer: Self-pay | Admitting: General Practice

## 2013-03-16 DIAGNOSIS — M109 Gout, unspecified: Secondary | ICD-10-CM

## 2013-03-16 DIAGNOSIS — I4891 Unspecified atrial fibrillation: Secondary | ICD-10-CM

## 2013-03-16 LAB — POCT INR: INR: 2

## 2013-03-16 MED ORDER — ALLOPURINOL 300 MG PO TABS: 300.0000 mg | ORAL_TABLET | Freq: Every day | ORAL | Status: DC

## 2013-03-16 MED ORDER — WARFARIN SODIUM 1 MG PO TABS: ORAL_TABLET | ORAL | Status: DC

## 2013-03-16 NOTE — Progress Notes (Signed)
Pre visit review using our clinic review tool, if applicable. No additional management support is needed unless otherwise documented below in the visit note. 

## 2013-03-23 ENCOUNTER — Telehealth: Payer: Self-pay | Admitting: Family Medicine

## 2013-03-23 DIAGNOSIS — E785 Hyperlipidemia, unspecified: Secondary | ICD-10-CM

## 2013-03-23 MED ORDER — FENOFIBRATE 160 MG PO TABS: 160.0000 mg | ORAL_TABLET | Freq: Every day | ORAL | Status: DC

## 2013-03-23 NOTE — Telephone Encounter (Signed)
Rx sent to pharmacy   

## 2013-03-23 NOTE — Telephone Encounter (Signed)
Pt is needing new rx for fenofibrate 160 MG tablet, 90 day supply, sent to [facility] mail service

## 2013-04-03 ENCOUNTER — Telehealth: Payer: Self-pay

## 2013-04-03 NOTE — Telephone Encounter (Signed)
Pt stated that she does take medication (Xanax) on a regular basis.

## 2013-04-13 ENCOUNTER — Ambulatory Visit (INDEPENDENT_AMBULATORY_CARE_PROVIDER_SITE_OTHER): Payer: Medicare Other | Admitting: General Practice

## 2013-04-13 DIAGNOSIS — Z5181 Encounter for therapeutic drug level monitoring: Secondary | ICD-10-CM

## 2013-04-13 DIAGNOSIS — I4891 Unspecified atrial fibrillation: Secondary | ICD-10-CM

## 2013-04-13 LAB — POCT INR: INR: 2.9

## 2013-04-13 NOTE — Progress Notes (Signed)
Pre visit review using our clinic review tool, if applicable. No additional management support is needed unless otherwise documented below in the visit note. 

## 2013-04-15 ENCOUNTER — Other Ambulatory Visit: Payer: Self-pay | Admitting: Family Medicine

## 2013-04-23 ENCOUNTER — Other Ambulatory Visit: Payer: Self-pay | Admitting: Family Medicine

## 2013-04-29 ENCOUNTER — Other Ambulatory Visit: Payer: Self-pay | Admitting: Family Medicine

## 2013-04-29 NOTE — Telephone Encounter (Signed)
Last visit 10/15/12 Last refill 10/15/12 #30 5 refill

## 2013-04-29 NOTE — Telephone Encounter (Signed)
Refill for 6 months. 

## 2013-05-04 ENCOUNTER — Other Ambulatory Visit: Payer: Self-pay | Admitting: Family Medicine

## 2013-05-07 ENCOUNTER — Encounter: Payer: Self-pay | Admitting: Family Medicine

## 2013-05-07 ENCOUNTER — Ambulatory Visit (INDEPENDENT_AMBULATORY_CARE_PROVIDER_SITE_OTHER): Payer: Medicare Other | Admitting: Family Medicine

## 2013-05-07 ENCOUNTER — Ambulatory Visit (INDEPENDENT_AMBULATORY_CARE_PROVIDER_SITE_OTHER): Payer: Medicare Other | Admitting: General Practice

## 2013-05-07 VITALS — BP 132/70 | HR 91 | Temp 97.6°F | Wt 137.0 lb

## 2013-05-07 DIAGNOSIS — I4891 Unspecified atrial fibrillation: Secondary | ICD-10-CM

## 2013-05-07 DIAGNOSIS — E785 Hyperlipidemia, unspecified: Secondary | ICD-10-CM

## 2013-05-07 DIAGNOSIS — Z23 Encounter for immunization: Secondary | ICD-10-CM

## 2013-05-07 DIAGNOSIS — I10 Essential (primary) hypertension: Secondary | ICD-10-CM

## 2013-05-07 DIAGNOSIS — Z5181 Encounter for therapeutic drug level monitoring: Secondary | ICD-10-CM

## 2013-05-07 LAB — HEPATIC FUNCTION PANEL
ALT: 24 U/L (ref 0–35)
AST: 32 U/L (ref 0–37)
Albumin: 4.3 g/dL (ref 3.5–5.2)
Alkaline Phosphatase: 46 U/L (ref 39–117)
Bilirubin, Direct: 0.1 mg/dL (ref 0.0–0.3)
Total Bilirubin: 1 mg/dL (ref 0.3–1.2)
Total Protein: 7.6 g/dL (ref 6.0–8.3)

## 2013-05-07 LAB — POCT INR: INR: 2.6

## 2013-05-07 LAB — LIPID PANEL
Cholesterol: 246 mg/dL — ABNORMAL HIGH (ref 0–200)
HDL: 33.1 mg/dL — ABNORMAL LOW (ref >39.00)
LDL Cholesterol: 105 mg/dL — ABNORMAL HIGH (ref 0–99)
Total CHOL/HDL Ratio: 7
Triglycerides: 542 mg/dL — ABNORMAL HIGH (ref 0.0–149.0)
VLDL: 108.4 mg/dL — ABNORMAL HIGH (ref 0.0–40.0)

## 2013-05-07 NOTE — Progress Notes (Signed)
Pre visit review using our clinic review tool, if applicable. No additional management support is needed unless otherwise documented below in the visit note. 

## 2013-05-07 NOTE — Progress Notes (Signed)
   Subjective:    Patient ID: Deanna [name], female    DOB: [birth year], 80 y.o.   MRN: [id number]  HPI Comments: Patient is an 80 year old female presenting for six-month followup. She has history of hypertension, atrial fibrillation, tricuspid regurg, and hypertriglyceridemia. Patient has no complaints or symptoms at this time.  Her blood pressure is well controlled with medication and she checks it regularly at home. Her warfarin dose recently changed and is monitored closely. Her last INR was 3 this morning at 2.6, previously 3 weeks ago at 2.9. Patient has never had a stroke.     Past Medical History  Diagnosis Date  . Anxiety   . GERD (gastroesophageal reflux disease)   . Hyperlipidemia   . Hypertension   . Atrial fibrillation   . TRICUSPID REGURGITATION 10/23/2006  . Breast cancer   . Colon polyp 2009    TUBULAR ADENOMA  . Diverticulosis of colon (without mention of hemorrhage) 2009  . Barrett's esophagus   . Arthritis   . Status post dilation of esophageal narrowing     Past Surgical History  Procedure Laterality Date  . Abdominal hysterectomy    . Cataract extraction Bilateral   . Bcc-face    . Mastectomy Bilateral     Review of Systems  Constitutional: Negative for fever.  Eyes: Negative for visual disturbance.  Respiratory: Negative for cough and shortness of breath.   Cardiovascular: Negative for chest pain and palpitations.  Gastrointestinal: Negative for abdominal pain.  Genitourinary: Negative for difficulty urinating.  Musculoskeletal: Negative for gait problem and myalgias.  Skin: Negative for rash.  Neurological: Negative for dizziness, weakness, light-headedness and headaches.       Objective:   Physical Exam  Constitutional: She appears well-developed and well-nourished.  HENT:  Head: Normocephalic.  Eyes: Conjunctivae and EOM are normal. Pupils are equal, round, and reactive to light.  Cardiovascular: Normal rate, regular rhythm, normal heart  sounds and normal pulses.   Pulses:      Radial pulses are 2+ on the right side, and 2+ on the left side.  Pulmonary/Chest: Effort normal and breath sounds normal.  Abdominal: Soft. There is no tenderness.  Musculoskeletal: Normal range of motion.  Neurological: She is alert. She has normal strength. No cranial nerve deficit or sensory deficit. Coordination and gait normal.  Skin: Skin is warm and dry.        Assessment & Plan:  1. Atrial Fibrillation Rhythm regular today. Continue taking warfarin as directed. INR drawn earlier today, level 2.6 2. Hypertriglyceridemia Continue taking Lipitor and fenofibrate. Recommended supplementing omega 3. Considered checking triglycerides again today, and is fasting so lipid panel will be drawn. Educational handout on hypertriglyceridemia given to patient.  3. Health Maintenance Prevnar 13 vaccine recommended because it has been at least a year since last vaccine. Given today.   [place], PA-S  Agree with above.  Appears to be in NSR today.  Carolann Littler, MD

## 2013-05-07 NOTE — Patient Instructions (Signed)
Hypertriglyceridemia  Diet for High blood levels of Triglycerides Most fats in food are triglycerides. Triglycerides in your blood are stored as fat in your body. High levels of triglycerides in your blood may put you at a greater risk for heart disease and stroke.  Normal triglyceride levels are less than 150 mg/dL. Borderline high levels are 150-199 mg/dl. High levels are 200 - 499 mg/dL, and very high triglyceride levels are greater than 500 mg/dL. The decision to treat high triglycerides is generally based on the level. For people with borderline or high triglyceride levels, treatment includes weight loss and exercise. Drugs are recommended for people with very high triglyceride levels. Many people who need treatment for high triglyceride levels have metabolic syndrome. This syndrome is a collection of disorders that often include: insulin resistance, high blood pressure, blood clotting problems, high cholesterol and triglycerides. TESTING PROCEDURE FOR TRIGLYCERIDES  You should not eat 4 hours before getting your triglycerides measured. The normal range of triglycerides is between 10 and 250 milligrams per deciliter (mg/dl). Some people may have extreme levels (1000 or above), but your triglyceride level may be too high if it is above 150 mg/dl, depending on what other risk factors you have for heart disease.  People with high blood triglycerides may also have high blood cholesterol levels. If you have high blood cholesterol as well as high blood triglycerides, your risk for heart disease is probably greater than if you only had high triglycerides. High blood cholesterol is one of the main risk factors for heart disease. CHANGING YOUR DIET  Your weight can affect your blood triglyceride level. If you are more than 20% above your ideal body weight, you may be able to lower your blood triglycerides by losing weight. Eating less and exercising regularly is the best way to combat this. Fat provides more  calories than any other food. The best way to lose weight is to eat less fat. Only 30% of your total calories should come from fat. Less than 7% of your diet should come from saturated fat. A diet low in fat and saturated fat is the same as a diet to decrease blood cholesterol. By eating a diet lower in fat, you may lose weight, lower your blood cholesterol, and lower your blood triglyceride level.  Eating a diet low in fat, especially saturated fat, may also help you lower your blood triglyceride level. Ask your dietitian to help you figure how much fat you can eat based on the number of calories your caregiver has prescribed for you.  Exercise, in addition to helping with weight loss may also help lower triglyceride levels.   Alcohol can increase blood triglycerides. You may need to stop drinking alcoholic beverages.  Too much carbohydrate in your diet may also increase your blood triglycerides. Some complex carbohydrates are necessary in your diet. These may include bread, rice, potatoes, other starchy vegetables and cereals.  Reduce "simple" carbohydrates. These may include pure sugars, candy, honey, and jelly without losing other nutrients. If you have the kind of high blood triglycerides that is affected by the amount of carbohydrates in your diet, you will need to eat less sugar and less high-sugar foods. Your caregiver can help you with this.  Adding 2-4 grams of fish oil (EPA+ DHA) may also help lower triglycerides. Speak with your caregiver before adding any supplements to your regimen. Following the Diet  Maintain your ideal weight. Your caregivers can help you with a diet. Generally, eating less food and getting more   exercise will help you lose weight. Joining a weight control group may also help. Ask your caregivers for a good weight control group in your area.  Eat low-fat foods instead of high-fat foods. This can help you lose weight too.  These foods are lower in fat. Eat MORE of these:    Dried beans, peas, and lentils.  Egg whites.  Low-fat cottage cheese.  Fish.  Lean cuts of meat, such as round, sirloin, rump, and flank (cut extra fat off meat you fix).  Whole grain breads, cereals and pasta.  Skim and nonfat dry milk.  Low-fat yogurt.  Poultry without the skin.  Cheese made with skim or part-skim milk, such as mozzarella, parmesan, farmers', ricotta, or pot cheese. These are higher fat foods. Eat LESS of these:   Whole milk and foods made from whole milk, such as American, blue, cheddar, monterey jack, and swiss cheese  High-fat meats, such as luncheon meats, sausages, knockwurst, bratwurst, hot dogs, ribs, corned beef, ground pork, and regular ground beef.  Fried foods. Limit saturated fats in your diet. Substituting unsaturated fat for saturated fat may decrease your blood triglyceride level. You will need to read package labels to know which products contain saturated fats.  These foods are high in saturated fat. Eat LESS of these:   Fried pork skins.  Whole milk.  Skin and fat from poultry.  Palm oil.  Butter.  Shortening.  Cream cheese.  Bacon.  Margarines and baked goods made from listed oils.  Vegetable shortenings.  Chitterlings.  Fat from meats.  Coconut oil.  Palm kernel oil.  Lard.  Cream.  Sour cream.  Fatback.  Coffee whiteners and non-dairy creamers made with these oils.  Cheese made from whole milk. Use unsaturated fats (both polyunsaturated and monounsaturated) moderately. Remember, even though unsaturated fats are better than saturated fats; you still want a diet low in total fat.  These foods are high in unsaturated fat:   Canola oil.  Sunflower oil.  Mayonnaise.  Almonds.  Peanuts.  Pine nuts.  Margarines made with these oils.  Safflower oil.  Olive oil.  Avocados.  Cashews.  Peanut butter.  Sunflower seeds.  Soybean oil.  Peanut  oil.  Olives.  Pecans.  Walnuts.  Pumpkin seeds. Avoid sugar and other high-sugar foods. This will decrease carbohydrates without decreasing other nutrients. Sugar in your food goes rapidly to your blood. When there is excess sugar in your blood, your liver may use it to make more triglycerides. Sugar also contains calories without other important nutrients.  Eat LESS of these:   Sugar, brown sugar, powdered sugar, jam, jelly, preserves, honey, syrup, molasses, pies, candy, cakes, cookies, frosting, pastries, colas, soft drinks, punches, fruit drinks, and regular gelatin.  Avoid alcohol. Alcohol, even more than sugar, may increase blood triglycerides. In addition, alcohol is high in calories and low in nutrients. Ask for sparkling water, or a diet soft drink instead of an alcoholic beverage. Suggestions for planning and preparing meals   Bake, broil, grill or roast meats instead of frying.  Remove fat from meats and skin from poultry before cooking.  Add spices, herbs, lemon juice or vinegar to vegetables instead of salt, rich sauces or gravies.  Use a non-stick skillet without fat or use no-stick sprays.  Cool and refrigerate stews and broth. Then remove the hardened fat floating on the surface before serving.  Refrigerate meat drippings and skim off fat to make low-fat gravies.  Serve more fish.  Use less butter,   margarine and other high-fat spreads on bread or vegetables.  Use skim or reconstituted non-fat dry milk for cooking.  Cook with low-fat cheeses.  Substitute low-fat yogurt or cottage cheese for all or part of the sour cream in recipes for sauces, dips or congealed salads.  Use half yogurt/half mayonnaise in salad recipes.  Substitute evaporated skim milk for cream. Evaporated skim milk or reconstituted non-fat dry milk can be whipped and substituted for whipped cream in certain recipes.  Choose fresh fruits for dessert instead of high-fat foods such as pies or  cakes. Fruits are naturally low in fat. When Dining Out   Order low-fat appetizers such as fruit or vegetable juice, pasta with vegetables or tomato sauce.  Select clear, rather than cream soups.  Ask that dressings and gravies be served on the side. Then use less of them.  Order foods that are baked, broiled, poached, steamed, stir-fried, or roasted.  Ask for margarine instead of butter, and use only a small amount.  Drink sparkling water, unsweetened tea or coffee, or diet soft drinks instead of alcohol or other sweet beverages. QUESTIONS AND ANSWERS ABOUT OTHER FATS IN THE BLOOD: SATURATED FAT, TRANS FAT, AND CHOLESTEROL What is trans fat? Trans fat is a type of fat that is formed when vegetable oil is hardened through a process called hydrogenation. This process helps makes foods more solid, gives them shape, and prolongs their shelf life. Trans fats are also called hydrogenated or partially hydrogenated oils.  What do saturated fat, trans fat, and cholesterol in foods have to do with heart disease? Saturated fat, trans fat, and cholesterol in the diet all raise the level of LDL "bad" cholesterol in the blood. The higher the LDL cholesterol, the greater the risk for coronary heart disease (CHD). Saturated fat and trans fat raise LDL similarly.  What foods contain saturated fat, trans fat, and cholesterol? High amounts of saturated fat are found in animal products, such as fatty cuts of meat, chicken skin, and full-fat dairy products like butter, whole milk, cream, and cheese, and in tropical vegetable oils such as palm, palm kernel, and coconut oil. Trans fat is found in some of the same foods as saturated fat, such as vegetable shortening, some margarines (especially hard or stick margarine), crackers, cookies, baked goods, fried foods, salad dressings, and other processed foods made with partially hydrogenated vegetable oils. Small amounts of trans fat also occur naturally in some animal  products, such as milk products, beef, and lamb. Foods high in cholesterol include liver, other organ meats, egg yolks, shrimp, and full-fat dairy products. How can I use the new food label to make heart-healthy food choices? Check the Nutrition Facts panel of the food label. Choose foods lower in saturated fat, trans fat, and cholesterol. For saturated fat and cholesterol, you can also use the Percent Daily Value (%DV): 5% DV or less is low, and 20% DV or more is high. (There is no %DV for trans fat.) Use the Nutrition Facts panel to choose foods low in saturated fat and cholesterol, and if the trans fat is not listed, read the ingredients and limit products that list shortening or hydrogenated or partially hydrogenated vegetable oil, which tend to be high in trans fat. POINTS TO REMEMBER:   Discuss your risk for heart disease with your caregivers, and take steps to reduce risk factors.  Change your diet. Choose foods that are low in saturated fat, trans fat, and cholesterol.  Add exercise to your daily routine if   it is not already being done. Participate in physical activity of moderate intensity, like brisk walking, for at least 30 minutes on most, and preferably all days of the week. No time? Break the 30 minutes into three, 10-minute segments during the day.  Stop smoking. If you do smoke, contact your caregiver to discuss ways in which they can help you quit.  Do not use street drugs.  Maintain a normal weight.  Maintain a healthy blood pressure.  Keep up with your blood work for checking the fats in your blood as directed by your caregiver. Document Released: 10/13/2003 Document Revised: 06/26/2011 Document Reviewed: 05/10/2008 [employer] Patient Information 2014 [employer].  

## 2013-05-11 ENCOUNTER — Ambulatory Visit: Payer: Medicare Other

## 2013-05-12 ENCOUNTER — Encounter: Payer: Self-pay | Admitting: Family Medicine

## 2013-06-18 ENCOUNTER — Ambulatory Visit (INDEPENDENT_AMBULATORY_CARE_PROVIDER_SITE_OTHER): Payer: Medicare Other | Admitting: General Practice

## 2013-06-18 ENCOUNTER — Other Ambulatory Visit: Payer: Self-pay | Admitting: General Practice

## 2013-06-18 DIAGNOSIS — Z5181 Encounter for therapeutic drug level monitoring: Secondary | ICD-10-CM

## 2013-06-18 DIAGNOSIS — I4891 Unspecified atrial fibrillation: Secondary | ICD-10-CM

## 2013-06-18 LAB — POCT INR: INR: 3.1

## 2013-06-18 MED ORDER — WARFARIN SODIUM 1 MG PO TABS: ORAL_TABLET | ORAL | Status: DC

## 2013-06-18 NOTE — Progress Notes (Signed)
Pre visit review using our clinic review tool, if applicable. No additional management support is needed unless otherwise documented below in the visit note. 

## 2013-07-26 ENCOUNTER — Other Ambulatory Visit: Payer: Self-pay | Admitting: Family Medicine

## 2013-07-30 ENCOUNTER — Telehealth: Payer: Self-pay | Admitting: Family Medicine

## 2013-07-30 ENCOUNTER — Ambulatory Visit (INDEPENDENT_AMBULATORY_CARE_PROVIDER_SITE_OTHER): Payer: Medicare Other | Admitting: General Practice

## 2013-07-30 DIAGNOSIS — Z5181 Encounter for therapeutic drug level monitoring: Secondary | ICD-10-CM

## 2013-07-30 DIAGNOSIS — I4891 Unspecified atrial fibrillation: Secondary | ICD-10-CM

## 2013-07-30 LAB — POCT INR: INR: 3.3

## 2013-07-30 MED ORDER — DIGOXIN 125 MCG PO TABS: ORAL_TABLET | ORAL | Status: DC

## 2013-07-30 NOTE — Progress Notes (Signed)
Pre visit review using our clinic review tool, if applicable. No additional management support is needed unless otherwise documented below in the visit note. 

## 2013-07-30 NOTE — Telephone Encounter (Signed)
Rx sent to pharmacy   

## 2013-07-30 NOTE — Telephone Encounter (Signed)
Pt request refill of the following:   DIGOX 125 MCG tablet    Phamacy:   [facility]

## 2013-09-10 ENCOUNTER — Ambulatory Visit (INDEPENDENT_AMBULATORY_CARE_PROVIDER_SITE_OTHER): Payer: Medicare Other | Admitting: Family

## 2013-09-10 ENCOUNTER — Emergency Department (HOSPITAL_COMMUNITY)
Admission: EM | Admit: 2013-09-10 | Discharge: 2013-09-10 | Disposition: A | Discharge status: Home / Self Care | Payer: PRIVATE HEALTH INSURANCE | Attending: Emergency Medicine | Admitting: Emergency Medicine

## 2013-09-10 ENCOUNTER — Encounter (HOSPITAL_COMMUNITY): Payer: Self-pay | Admitting: Emergency Medicine

## 2013-09-10 ENCOUNTER — Emergency Department (HOSPITAL_COMMUNITY): Payer: PRIVATE HEALTH INSURANCE

## 2013-09-10 ENCOUNTER — Ambulatory Visit: Payer: Medicare Other

## 2013-09-10 DIAGNOSIS — E785 Hyperlipidemia, unspecified: Secondary | ICD-10-CM | POA: Diagnosis not present

## 2013-09-10 DIAGNOSIS — I4891 Unspecified atrial fibrillation: Secondary | ICD-10-CM

## 2013-09-10 DIAGNOSIS — I482 Chronic atrial fibrillation, unspecified: Secondary | ICD-10-CM

## 2013-09-10 DIAGNOSIS — Z8739 Personal history of other diseases of the musculoskeletal system and connective tissue: Secondary | ICD-10-CM | POA: Insufficient documentation

## 2013-09-10 DIAGNOSIS — I10 Essential (primary) hypertension: Secondary | ICD-10-CM | POA: Insufficient documentation

## 2013-09-10 DIAGNOSIS — Z7901 Long term (current) use of anticoagulants: Secondary | ICD-10-CM | POA: Insufficient documentation

## 2013-09-10 DIAGNOSIS — Z853 Personal history of malignant neoplasm of breast: Secondary | ICD-10-CM | POA: Diagnosis not present

## 2013-09-10 DIAGNOSIS — R42 Dizziness and giddiness: Secondary | ICD-10-CM | POA: Insufficient documentation

## 2013-09-10 DIAGNOSIS — Z5181 Encounter for therapeutic drug level monitoring: Secondary | ICD-10-CM

## 2013-09-10 DIAGNOSIS — Z860100 Personal history of colon polyps, unspecified: Secondary | ICD-10-CM | POA: Insufficient documentation

## 2013-09-10 DIAGNOSIS — F411 Generalized anxiety disorder: Secondary | ICD-10-CM | POA: Insufficient documentation

## 2013-09-10 DIAGNOSIS — Z79899 Other long term (current) drug therapy: Secondary | ICD-10-CM | POA: Insufficient documentation

## 2013-09-10 DIAGNOSIS — K219 Gastro-esophageal reflux disease without esophagitis: Secondary | ICD-10-CM | POA: Diagnosis not present

## 2013-09-10 LAB — CBC
HCT: 42.6 % (ref 36.0–46.0)
Hemoglobin: 14.3 g/dL (ref 12.0–15.0)
MCH: 31.7 pg (ref 26.0–34.0)
MCHC: 33.6 g/dL (ref 30.0–36.0)
MCV: 94.5 fL (ref 78.0–100.0)
Platelets: 254 10*3/uL (ref 150–400)
RBC: 4.51 MIL/uL (ref 3.87–5.11)
RDW: 15.2 % (ref 11.5–15.5)
WBC: 7.2 10*3/uL (ref 4.0–10.5)

## 2013-09-10 LAB — DIFFERENTIAL
Basophils Absolute: 0 10*3/uL (ref 0.0–0.1)
Basophils Relative: 0 % (ref 0–1)
Eosinophils Absolute: 0.2 10*3/uL (ref 0.0–0.7)
Eosinophils Relative: 2 % (ref 0–5)
Lymphocytes Relative: 25 % (ref 12–46)
Lymphs Abs: 1.8 10*3/uL (ref 0.7–4.0)
Monocytes Absolute: 0.7 10*3/uL (ref 0.1–1.0)
Monocytes Relative: 10 % (ref 3–12)
Neutro Abs: 4.5 10*3/uL (ref 1.7–7.7)
Neutrophils Relative %: 63 % (ref 43–77)

## 2013-09-10 LAB — COMPREHENSIVE METABOLIC PANEL
ALT: 25 U/L (ref 0–35)
AST: 33 U/L (ref 0–37)
Albumin: 4.1 g/dL (ref 3.5–5.2)
Alkaline Phosphatase: 57 U/L (ref 39–117)
Anion gap: 15 (ref 5–15)
BUN: 19 mg/dL (ref 6–23)
CO2: 27 mEq/L (ref 19–32)
Calcium: 10.2 mg/dL (ref 8.4–10.5)
Chloride: 101 mEq/L (ref 96–112)
Creatinine, Ser: 1.03 mg/dL (ref 0.50–1.10)
GFR calc Af Amer: 56 mL/min — ABNORMAL LOW (ref >90)
GFR calc non Af Amer: 48 mL/min — ABNORMAL LOW (ref >90)
Glucose, Bld: 132 mg/dL — ABNORMAL HIGH (ref 70–99)
Potassium: 4 mEq/L (ref 3.7–5.3)
Sodium: 143 mEq/L (ref 137–147)
Total Bilirubin: 0.6 mg/dL (ref 0.3–1.2)
Total Protein: 7.5 g/dL (ref 6.0–8.3)

## 2013-09-10 LAB — I-STAT TROPONIN, ED: Troponin i, poc: 0.03 ng/mL (ref 0.00–0.08)

## 2013-09-10 LAB — URINE MICROSCOPIC-ADD ON

## 2013-09-10 LAB — URINALYSIS, ROUTINE W REFLEX MICROSCOPIC
Bilirubin Urine: NEGATIVE
Glucose, UA: NEGATIVE mg/dL
Hgb urine dipstick: NEGATIVE
Ketones, ur: NEGATIVE mg/dL
Nitrite: NEGATIVE
Protein, ur: NEGATIVE mg/dL
Specific Gravity, Urine: 1.004 — ABNORMAL LOW (ref 1.005–1.030)
Urobilinogen, UA: 0.2 mg/dL (ref 0.0–1.0)
pH: 6.5 (ref 5.0–8.0)

## 2013-09-10 LAB — APTT: aPTT: 52 seconds — ABNORMAL HIGH (ref 24–37)

## 2013-09-10 LAB — PROTIME-INR
INR: 2.37 — ABNORMAL HIGH (ref 0.00–1.49)
Prothrombin Time: 25.9 seconds — ABNORMAL HIGH (ref 11.6–15.2)

## 2013-09-10 LAB — DIGOXIN LEVEL: Digoxin Level: 1 ng/mL (ref 0.8–2.0)

## 2013-09-10 LAB — POCT INR: INR: 2.7

## 2013-09-10 NOTE — ED Provider Notes (Signed)
I saw and evaluated the patient, reviewed the resident's note and I agree with the findings and plan. Patient is an 80 year old female who presents with complaints of dizziness. She is having her hair done at the hairdresser when the symptoms began. She had her head went backwards into the sink and when she sat forward became dizzy and her equilibrium was off. She denies any headache. She denies any weakness in her hands or feet. She denies any visual changes.  On exam, vitals are stable and the patient is afebrile. Head is atraumatic, normocephalic. Neck is supple. Heart is irregularly irregular without murmurs. Lungs are clear and equal. Cranial nerves II through XII are grossly intact without focal deficits. There is no nystagmus. Strength is 5 out of 5 in the bilateral upper and lower extremities. Finger to nose is within normal limits.  This appears to be a peripheral vertigo. She is now feeling better now symptom-free. Workup reveals no alternate diagnosis. She is in atrial fibrillation, however this has been known to come and go with her and I doubt is the cause of her symptoms. She is on Coumadin with an INR of 2.4 and head CT is unremarkable. I feel as though she is appropriate for discharge with when necessary return   EKG Interpretation   Date/Time:  Thursday September 10 2013 16:56:03 EDT Ventricular Rate:  68 PR Interval:    QRS Duration: 80 QT Interval:  374 QTC Calculation: 397 R Axis:   53 Text Interpretation:  Atrial fibrillation Nonspecific ST and T wave  abnormality Abnormal ECG Confirmed by Beau Fanny  MD, Leilene Diprima ([id number]) on  09/10/2013 6:33:13 PM        Veryl Speak, MD 09/10/13 [id number]

## 2013-09-10 NOTE — ED Notes (Signed)
Consulted SR about pt

## 2013-09-10 NOTE — Discharge Instructions (Signed)

## 2013-09-10 NOTE — ED Provider Notes (Signed)
CSN: [id number]     Arrival date & time 09/10/13  1646 History   First MD Initiated Contact with Patient 09/10/13 1751     Chief Complaint  Patient presents with  . Dizziness     (Consider location/radiation/quality/duration/timing/severity/associated sxs/prior Treatment) HPI Comments: She reports being at the hairdresser today; she leaned back to have her hair washed and became dizzy when she got up.  She describes her dizziness as feeling off balance when walking.  She denies any spinning sensation or presyncopal feelings.  She denies any history of strokes and denies any current weakness, difficulties with speech or vision.  She denies any recent infections, or cough, dysuria, nausea, vomiting, diarrhea.  She has a history of A. fib, but denies any current chest pain, palpitations, or shortness of breath.  She reports compliance with her digoxin and warfarin.  Denies any recent falls or head trauma.  She's not had any recent medication changes.  She denies any history of Mnire's disease or BPPV; no tinnitus, already wears hearing aids but has not noticed any hearing changes.  Patient is a 80 y.o. female presenting with dizziness. The history is provided by the patient.  Dizziness Quality:  Imbalance Severity:  Mild Onset quality:  Sudden Duration:  5 hours Timing:  Intermittent Progression:  Waxing and waning Chronicity:  New Context: bending over and head movement   Context: not with ear pain, not with loss of consciousness and not when standing up   Relieved by:  Being still Worsened by:  Turning head Ineffective treatments:  None tried Associated symptoms: no blood in stool, no chest pain, no diarrhea, no headaches, no hearing loss, no nausea, no palpitations, no shortness of breath, no syncope, no tinnitus, no vision changes, no vomiting and no weakness   Risk factors: no anemia, no hx of stroke, no hx of vertigo, no Meniere's disease and no new medications     Past Medical  History  Diagnosis Date  . Anxiety   . GERD (gastroesophageal reflux disease)   . Hyperlipidemia   . Hypertension   . Atrial fibrillation   . TRICUSPID REGURGITATION 10/23/2006  . Breast cancer   . Colon polyp 2009    TUBULAR ADENOMA  . Diverticulosis of colon (without mention of hemorrhage) 2009  . Barrett's esophagus   . Arthritis   . Status post dilation of esophageal narrowing    Past Surgical History  Procedure Laterality Date  . Abdominal hysterectomy    . Cataract extraction Bilateral   . Bcc-face    . Mastectomy Bilateral    Family History  Problem Relation Age of Onset  . Colon cancer Mother   . Hypertension Mother   . Colon cancer Father   . Breast cancer Other   . Colon polyps Son    History  Substance Use Topics  . Smoking status: Never Smoker   . Smokeless tobacco: Never Used  . Alcohol Use: 2.5 oz/week    5 drink(s) per week   OB History   Grav Para Term Preterm Abortions TAB SAB Ect Mult Living                 Review of Systems  Constitutional: Negative for fever, chills and fatigue.  HENT: Negative for congestion, ear pain, hearing loss, sinus pressure, sore throat and tinnitus.   Eyes: Negative for pain and visual disturbance.  Respiratory: Negative for cough, chest tightness and shortness of breath.   Cardiovascular: Negative for chest pain, palpitations, leg swelling  and syncope.  Gastrointestinal: Negative for nausea, vomiting, abdominal pain, diarrhea and blood in stool.  Genitourinary: Negative for dysuria.  Neurological: Positive for dizziness. Negative for tremors, syncope, facial asymmetry, speech difficulty, weakness, numbness and headaches.  Psychiatric/Behavioral: The patient is nervous/anxious.       Allergies  Aspirin  Home Medications   Prior to Admission medications   Medication Sig Start Date End Date Taking? Authorizing Provider  allopurinol (ZYLOPRIM) 300 MG tablet Take 300 mg by mouth daily. 03/16/13 03/16/14 Yes Eulas Post, MD  ALPRAZolam Duanne Moron) 0.5 MG tablet Take 0.5 mg by mouth at bedtime as needed for anxiety.   Yes Historical Provider, MD  atorvastatin (LIPITOR) 40 MG tablet Take 40 mg by mouth at bedtime.   Yes Historical Provider, MD  Carboxymethylcellul-Glycerin (OPTIVE) 0.5-0.9 % SOLN Place 1 drop into both eyes daily.    Yes Historical Provider, MD  digoxin (LANOXIN) 0.125 MG tablet Take 0.125 mg by mouth daily. Take 1 tablet by mouth  daily 07/30/13  Yes Eulas Post, MD  fenofibrate 160 MG tablet Take 160 mg by mouth daily. 03/23/13 03/23/14 Yes Eulas Post, MD  lisinopril (PRINIVIL,ZESTRIL) 20 MG tablet Take 20 mg by mouth 2 (two) times daily. 10/15/12  Yes Eulas Post, MD  loratadine (CLARITIN) 10 MG tablet Take 10 mg by mouth daily.     Yes Historical Provider, MD  Multiple Vitamin (MULTIVITAMIN) tablet Take 1 tablet by mouth daily.     Yes Historical Provider, MD  omeprazole (PRILOSEC) 20 MG capsule Take 20 mg by mouth daily. Take 1 capsule by mouth  daily 10/21/12  Yes Timoteo Gaul, FNP  spironolactone (ALDACTONE) 50 MG tablet Take 50 mg by mouth daily. Take 1 tablet by mouth  daily 10/21/12  Yes Timoteo Gaul, FNP  traMADol (ULTRAM) 50 MG tablet Take 50 mg by mouth every 6 (six) hours as needed for moderate pain.   Yes Historical Provider, MD  warfarin (COUMADIN) 1 MG tablet Take 1-2 mg by mouth See admin instructions. Take as directed by anticoagulation clinic. Take 1mg  every day except on wednesdays take 2mg  06/18/13  Yes Bruce Dan Europe, MD   BP 164/78  Pulse 68  Temp(Src) 98.4 F (36.9 C)  Resp 18  SpO2 97% Physical Exam  Constitutional: She is oriented to person, place, and time. She appears well-developed. No distress.  HENT:  Right Ear: External ear normal.  Left Ear: External ear normal.  Mouth/Throat: Oropharynx is clear and moist.  Eyes: EOM are normal. Pupils are equal, round, and reactive to light.  Neck: Normal range of motion. Neck supple. No  JVD present.  Cardiovascular: Normal heart sounds.   No murmur heard. Irregularly irregular rhythm w/o RVR.   Pulmonary/Chest: Effort normal and breath sounds normal. No respiratory distress.  Abdominal: Soft. There is no tenderness.  Neurological: She is alert and oriented to person, place, and time. No cranial nerve deficit. Coordination normal.  Upper and lower extremity sensation and strength grossly intact.  Cerebellar testing within normal limits  Skin: She is not diaphoretic.    ED Course  Procedures (including critical care time) Labs Review Labs Reviewed  PROTIME-INR - Abnormal; Notable for the following:    Prothrombin Time 25.9 (*)    INR 2.37 (*)    All other components within normal limits  APTT - Abnormal; Notable for the following:    aPTT 52 (*)    All other components within normal limits  COMPREHENSIVE METABOLIC  PANEL - Abnormal; Notable for the following:    Glucose, Bld 132 (*)    GFR calc non Af Amer 48 (*)    GFR calc Af Amer 56 (*)    All other components within normal limits  URINALYSIS, ROUTINE W REFLEX MICROSCOPIC - Abnormal; Notable for the following:    Specific Gravity, Urine 1.004 (*)    Leukocytes, UA SMALL (*)    All other components within normal limits  URINE MICROSCOPIC-ADD ON - Abnormal; Notable for the following:    Squamous Epithelial / LPF FEW (*)    All other components within normal limits  CBC  DIFFERENTIAL  DIGOXIN LEVEL  CBG MONITORING, ED  I-STAT TROPOININ, ED   Imaging Review Ct Head (brain) Wo Contrast  09/10/2013   CLINICAL DATA:  Dizziness.  EXAM: CT HEAD WITHOUT CONTRAST  TECHNIQUE: Contiguous axial images were obtained from the base of the skull through the vertex without intravenous contrast.  COMPARISON:  None.  FINDINGS: Bony calvarium appears intact. Mild diffuse cortical atrophy is noted. Mild chronic ischemic white matter disease is noted. No mass effect or midline shift is noted. Ventricular size is within normal  limits. There is no evidence of mass lesion, hemorrhage or acute infarction.  IMPRESSION: Mild diffuse cortical atrophy. Mild chronic ischemic white matter disease. No acute intracranial abnormality seen.   Electronically Signed   By: Sabino Dick M.D.   On: 09/10/2013 19:19     EKG Interpretation   Date/Time:  Thursday September 10 2013 16:56:03 EDT Ventricular Rate:  68 PR Interval:    QRS Duration: 80 QT Interval:  374 QTC Calculation: 397 R Axis:   53 Text Interpretation:  Atrial fibrillation Nonspecific ST and T wave  abnormality Abnormal ECG Confirmed by DELOS  MD, DOUGLAS ([id number]) on  09/10/2013 6:33:13 PM      MDM   Final diagnoses:  Atrial fibrillation, unspecified  Essential hypertension  Dizziness   Dizziness - A. fib noted on EKG without RVR, currently controlled on diltiazem and warfarin.  CT negative for acute intracranial process.  Digoxin within normal limits.  Hypertension improved while in ED; however she has several home BP readings that are within normal limits.  Troponins negative - Followup with PCP for ongoing A. fib management  Olam Idler, MD 09/10/13 [phone]

## 2013-09-10 NOTE — ED Notes (Signed)
Pt to ct scan via stretcher with rad tech

## 2013-09-10 NOTE — ED Notes (Signed)
Pt c/o dizziness with neck pain starting today while getting her hair done; pt sts dizziness worse with position change and some blurred vision; pt with no obvious neuro deficits at present

## 2013-09-10 NOTE — Patient Instructions (Signed)
1 tablet daily except take 2 tablets on Wednesdays.  Re-check in 6 weeks.  Anticoagulation Dose Instructions as of 09/10/2013     Dorene Grebe Tue Wed Thu Fri Sat   New Dose 1 mg 1 mg 1 mg 2 mg 1 mg 1 mg 1 mg    Description        1 tablet daily except take 2 tablets on Wednesdays.  Re-check in 6 weeks.

## 2013-09-11 LAB — CBG MONITORING, ED: Glucose-Capillary: 123 mg/dL — ABNORMAL HIGH (ref 70–99)

## 2013-09-17 ENCOUNTER — Encounter: Payer: Self-pay | Admitting: Family Medicine

## 2013-09-17 ENCOUNTER — Ambulatory Visit (INDEPENDENT_AMBULATORY_CARE_PROVIDER_SITE_OTHER): Payer: Medicare Other | Admitting: Family Medicine

## 2013-09-17 VITALS — BP 142/70 | HR 74 | Temp 97.7°F | Wt 131.0 lb

## 2013-09-17 DIAGNOSIS — R42 Dizziness and giddiness: Secondary | ICD-10-CM

## 2013-09-17 DIAGNOSIS — I4891 Unspecified atrial fibrillation: Secondary | ICD-10-CM

## 2013-09-17 NOTE — Progress Notes (Signed)
Pre visit review using our clinic review tool, if applicable. No additional management support is needed unless otherwise documented below in the visit note. 

## 2013-09-17 NOTE — Patient Instructions (Signed)
Monitor blood pressure and be in touch if consistently > 150/90 

## 2013-09-17 NOTE — Progress Notes (Signed)
Subjective:    Patient ID: Deanna [name], female    DOB: [birth year], 80 y.o.   MRN: [id number]  HPI ER followup. Patient was seen in ED 09/10/2013. She was at the hairdresser and leaning back to have her hair washed. When she sat up she felt very dizzy. She describes as lightheadedness but no syncope. No vertigo. No chest pains. She continued to feel poorly. She denied any nausea or vomiting. No headaches. Patient drove herself home and ended up calling triage and was advised to go the ER because of concern for her symptoms. She had CT of head which showed only some chronic age related changes. Her lab work was unremarkable. She has chronic atrial fibrillation on Coumadin and INR was therapeutic. Troponins were negative. Electrolytes were unremarkable. EKG revealed no acute findings. Her blood pressure was initially elevated but declined after observation.  Patient had digoxin level which was normal. She feels back to baseline this time. She denies any recent slurred speech or any focal weakness. She does complain of some myalgias which he thinks may be related to Lipitor. She's had previous intolerance with many other statins  Past Medical History  Diagnosis Date  . Anxiety   . GERD (gastroesophageal reflux disease)   . Hyperlipidemia   . Hypertension   . Atrial fibrillation   . TRICUSPID REGURGITATION 10/23/2006  . Breast cancer   . Colon polyp 2009    TUBULAR ADENOMA  . Diverticulosis of colon (without mention of hemorrhage) 2009  . Barrett's esophagus   . Arthritis   . Status post dilation of esophageal narrowing    Past Surgical History  Procedure Laterality Date  . Abdominal hysterectomy    . Cataract extraction Bilateral   . Bcc-face    . Mastectomy Bilateral     reports that she has never smoked. She has never used smokeless tobacco. She reports that she drinks about 2.5 ounces of alcohol per week. She reports that she does not use illicit drugs. family history includes  Breast cancer in her other; Colon cancer in her father and mother; Colon polyps in her son; Hypertension in her mother. Allergies  Allergen Reactions  . Aspirin     REACTION: nervousness---tolerates ibuprofen      Review of Systems  Constitutional: Negative for fatigue and unexpected weight change.  Eyes: Negative for visual disturbance.  Respiratory: Negative for cough, chest tightness, shortness of breath and wheezing.   Cardiovascular: Negative for chest pain, palpitations and leg swelling.  Endocrine: Negative for polydipsia and polyuria.  Neurological: Negative for dizziness, seizures, syncope, weakness, light-headedness and headaches.       Objective:   Physical Exam  Constitutional: She is oriented to person, place, and time. She appears well-developed and well-nourished.  Neck: Neck supple.  No carotid bruits  Cardiovascular: Normal rate.   Pulmonary/Chest: Effort normal and breath sounds normal. No respiratory distress. She has no wheezes. She has no rales.  Musculoskeletal: She exhibits no edema.  Neurological: She is alert and oriented to person, place, and time. No cranial nerve deficit.  Cerebellar function is normal. Gait is normal. No focal weakness          Assessment & Plan:  Recent transient dizziness. Her symptoms were very nonspecific. Work up as above was unrevealing. She has not described any orthostatic symptoms and blood pressure stable today. We have recommended no change in medication. Continue close home blood pressure monitoring Chronic atrial fibrillation rate controlled. Continue Coumadin. We discussed possible change  from digoxin to other agents such as diltiazem or beta blocker but since she already has good blood pressure control we elected not to make any changes in her regimen.

## 2013-09-25 ENCOUNTER — Encounter: Payer: Self-pay | Admitting: Gastroenterology

## 2013-10-10 ENCOUNTER — Other Ambulatory Visit: Payer: Self-pay | Admitting: Family

## 2013-10-19 ENCOUNTER — Telehealth: Payer: Self-pay | Admitting: Family Medicine

## 2013-10-19 MED ORDER — ATORVASTATIN CALCIUM 40 MG PO TABS: 40.0000 mg | ORAL_TABLET | Freq: Every day | ORAL | Status: DC

## 2013-10-19 NOTE — Telephone Encounter (Signed)
Pt request refill of the following: atorvastatin (LIPITOR) 40 MG tablet    Phamacy: [facility]

## 2013-10-19 NOTE — Telephone Encounter (Signed)
Rx sent to pharmacy   

## 2013-10-22 ENCOUNTER — Ambulatory Visit: Payer: Medicare Other

## 2013-10-22 ENCOUNTER — Ambulatory Visit: Payer: Medicare Other | Admitting: Family

## 2013-10-23 ENCOUNTER — Other Ambulatory Visit: Payer: Self-pay | Admitting: Family Medicine

## 2013-10-26 NOTE — Telephone Encounter (Signed)
Refill for 6 months. 

## 2013-10-26 NOTE — Telephone Encounter (Signed)
Last visit 09/17/13 Last refill 04/29/13 #30 5 refill

## 2013-11-05 ENCOUNTER — Encounter: Payer: Self-pay | Admitting: Family Medicine

## 2013-11-05 ENCOUNTER — Ambulatory Visit (INDEPENDENT_AMBULATORY_CARE_PROVIDER_SITE_OTHER): Payer: Medicare Other | Admitting: Family Medicine

## 2013-11-05 ENCOUNTER — Ambulatory Visit (INDEPENDENT_AMBULATORY_CARE_PROVIDER_SITE_OTHER): Payer: Medicare Other | Admitting: Family

## 2013-11-05 VITALS — BP 130/72 | HR 83 | Temp 97.4°F | Wt 131.0 lb

## 2013-11-05 DIAGNOSIS — I10 Essential (primary) hypertension: Secondary | ICD-10-CM

## 2013-11-05 DIAGNOSIS — Z23 Encounter for immunization: Secondary | ICD-10-CM

## 2013-11-05 DIAGNOSIS — E785 Hyperlipidemia, unspecified: Secondary | ICD-10-CM

## 2013-11-05 DIAGNOSIS — Z5181 Encounter for therapeutic drug level monitoring: Secondary | ICD-10-CM

## 2013-11-05 DIAGNOSIS — H6121 Impacted cerumen, right ear: Secondary | ICD-10-CM

## 2013-11-05 DIAGNOSIS — I4891 Unspecified atrial fibrillation: Secondary | ICD-10-CM

## 2013-11-05 LAB — POCT INR: INR: 2.2

## 2013-11-05 NOTE — Patient Instructions (Signed)
Cerumen Impaction °A cerumen impaction is when the wax in your ear forms a plug. This plug usually causes reduced hearing. Sometimes it also causes an earache or dizziness. Removing a cerumen impaction can be difficult and painful. The wax sticks to the ear canal. The canal is sensitive and bleeds easily. If you try to remove a heavy wax buildup with a cotton tipped swab, you may push it in further. °Irrigation with water, suction, and small ear curettes may be used to clear out the wax. If the impaction is fixed to the skin in the ear canal, ear drops may be needed for a few days to loosen the wax. People who build up a lot of wax frequently can use ear wax removal products available in your local drugstore. °SEEK MEDICAL CARE IF:  °You develop an earache, increased hearing loss, or marked dizziness. °Document Released: 02/02/2004 Document Revised: 03/19/2011 Document Reviewed: 03/24/2009 °ExitCare® Patient Information ©2015 [employer]. This information is not intended to replace advice given to you by your health care provider. Make sure you discuss any questions you have with your health care provider. ° °Hypertension °Hypertension, commonly called high blood pressure, is when the force of blood pumping through your arteries is too strong. Your arteries are the blood vessels that carry blood from your heart throughout your body. A blood pressure reading consists of a higher number over a lower number, such as 110/72. The higher number (systolic) is the pressure inside your arteries when your heart pumps. The lower number (diastolic) is the pressure inside your arteries when your heart relaxes. Ideally you want your blood pressure below 120/80. °Hypertension forces your heart to work harder to pump blood. Your arteries may become narrow or stiff. Having hypertension puts you at risk for heart disease, stroke, and other problems.  °RISK FACTORS °Some risk factors for high blood pressure are controllable. Others  are not.  °Risk factors you cannot control include:  °· Race. You may be at higher risk if you are African American. °· Age. Risk increases with age. °· Gender. Men are at higher risk than women before age 45 years. After age 65, women are at higher risk than men. °Risk factors you can control include: °· Not getting enough exercise or physical activity. °· Being overweight. °· Getting too much fat, sugar, calories, or salt in your diet. °· Drinking too much alcohol. °SIGNS AND SYMPTOMS °Hypertension does not usually cause signs or symptoms. Extremely high blood pressure (hypertensive crisis) may cause headache, anxiety, shortness of breath, and nosebleed. °DIAGNOSIS  °To check if you have hypertension, your health care provider will measure your blood pressure while you are seated, with your arm held at the level of your heart. It should be measured at least twice using the same arm. Certain conditions can cause a difference in blood pressure between your right and left arms. A blood pressure reading that is higher than normal on one occasion does not mean that you need treatment. If one blood pressure reading is high, ask your health care provider about having it checked again. °TREATMENT  °Treating high blood pressure includes making lifestyle changes and possibly taking medicine. Living a healthy lifestyle can help lower high blood pressure. You may need to change some of your habits. °Lifestyle changes may include: °· Following the DASH diet. This diet is high in fruits, vegetables, and whole grains. It is low in salt, red meat, and added sugars. °· Getting at least 2½ hours of brisk physical   activity every week. °· Losing weight if necessary. °· Not smoking. °· Limiting alcoholic beverages. °· Learning ways to reduce stress. ° If lifestyle changes are not enough to get your blood pressure under control, your health care provider may prescribe medicine. You may need to take more than one. Work closely with your  health care provider to understand the risks and benefits. °HOME CARE INSTRUCTIONS °· Have your blood pressure rechecked as directed by your health care provider.   °· Take medicines only as directed by your health care provider. Follow the directions carefully. Blood pressure medicines must be taken as prescribed. The medicine does not work as well when you skip doses. Skipping doses also puts you at risk for problems.   °· Do not smoke.   °· Monitor your blood pressure at home as directed by your health care provider.  °SEEK MEDICAL CARE IF:  °· You think you are having a reaction to medicines taken. °· You have recurrent headaches or feel dizzy. °· You have swelling in your ankles. °· You have trouble with your vision. °SEEK IMMEDIATE MEDICAL CARE IF: °· You develop a severe headache or confusion. °· You have unusual weakness, numbness, or feel faint. °· You have severe chest or abdominal pain. °· You vomit repeatedly. °· You have trouble breathing. °MAKE SURE YOU:  °· Understand these instructions. °· Will watch your condition. °· Will get help right away if you are not doing well or get worse. °Document Released: 12/25/2004 Document Revised: 05/11/2013 Document Reviewed: 10/17/2012 °ExitCare® Patient Information ©2015 [employer]. This information is not intended to replace advice given to you by your health care provider. Make sure you discuss any questions you have with your health care provider. ° °

## 2013-11-05 NOTE — Progress Notes (Signed)
Subjective:    Patient ID: Deanna [name], female    DOB: Jul 17, 1926, 80 y.o.   MRN: [id number]  HPI 80 year old female presents today for 6 month routine follow up.  Patient denies any recent complaints.  Wants ears check for cerumen today, while her hearing aids are out.  Denies fevers, sinus congestion, SOB, chest pain, ankle edema, dizziness and falls.  She recently was in the ER for dizziness, and everything was ruled out.  She states that it was situational and due to a position she was reclined in when her hair was being washed at the hair salon. Denies recent episodes.  States labs were drawn there and everything was normal.  INR was checked today and was 2.2.   Past Medical History  Diagnosis Date  . Anxiety   . GERD (gastroesophageal reflux disease)   . Hyperlipidemia   . Hypertension   . Atrial fibrillation   . TRICUSPID REGURGITATION 10/23/2006  . Breast cancer   . Colon polyp 2009    TUBULAR ADENOMA  . Diverticulosis of colon (without mention of hemorrhage) 2009  . Barrett's esophagus   . Arthritis   . Status post dilation of esophageal narrowing    Past Surgical History  Procedure Laterality Date  . Abdominal hysterectomy    . Cataract extraction Bilateral   . Bcc-face    . Mastectomy Bilateral     reports that she has never smoked. She has never used smokeless tobacco. She reports that she drinks about 2.5 ounces of alcohol per week. She reports that she does not use illicit drugs. family history includes Breast cancer in her other; Colon cancer in her father and mother; Colon polyps in her son; Hypertension in her mother. Allergies  Allergen Reactions  . Aspirin     REACTION: nervousness---tolerates ibuprofen      Review of Systems  Constitutional: Negative for fever, activity change, appetite change and fatigue.  HENT: Negative.        Right cerumen impaction.  Eyes: Negative.   Respiratory: Negative for cough, chest tightness, shortness of breath and  wheezing.   Cardiovascular: Negative for chest pain and leg swelling.  Gastrointestinal: Negative for nausea, vomiting, abdominal pain, diarrhea and constipation.  Musculoskeletal: Negative for gait problem, joint swelling and myalgias.  Skin: Negative for rash.  Neurological: Negative for dizziness, syncope, weakness, light-headedness and numbness.       Objective:   Physical Exam  Nursing note and vitals reviewed. Constitutional: She is oriented to person, place, and time. She appears well-developed and well-nourished. No distress.  HENT:  Head: Normocephalic and atraumatic.  Right Ear: External ear normal.  Left Ear: External ear normal.  Right ear canal impacted with hard cerumen.  Wears hearing aids, but are removed for today.   Neck: Neck supple. No JVD present. No tracheal deviation present. No thyromegaly present.  Cardiovascular: Normal rate and regular rhythm.   No murmur (Currently in NSR.) heard. Pulmonary/Chest: Effort normal and breath sounds normal. No respiratory distress. She has no wheezes.  Lymphadenopathy:    She has no cervical adenopathy.  Neurological: She is alert and oriented to person, place, and time.  Skin: Skin is warm and dry. No rash noted. She is not diaphoretic. No erythema.  Psychiatric: She has a normal mood and affect. Her behavior is normal. Judgment and thought content normal.          Assessment & Plan:  1. Cerumen impaction to right ear- Left ear has  been cleaned out in the past.  Right ear rinsed with peroxide today.  Resolved and looked clear afterwards.   2.  HTN- Well controlled at 130/72 on current medications.  Continue to monitor BP.  Additional lab work including electrolytes will be evaluated at the next visit.  Not necessary today since they were just drawn at the hospital in September.  3.  Hyperlipidemia- Recheck lipids at next visit.  Continue on fenofibrate.  Have discussed other options to bring triglycerides down, but with  FH, chronic elevation and her age, patient would like to continue to watch the levels for now.    Joseph Art, PA-S  Agree with the above. Her INR is therapeutic today.  Bruce Burchette M.D.

## 2013-11-05 NOTE — Patient Instructions (Signed)
1 tablet daily except take 2 tablets on Wednesdays.  Re-check in 6 weeks.  Anticoagulation Dose Instructions as of 11/05/2013     Deanna [name] Tue Wed Thu Fri Sat   New Dose 1 mg 1 mg 1 mg 2 mg 1 mg 1 mg 1 mg    Description        1 tablet daily except take 2 tablets on Wednesdays.  Re-check in 6 weeks.

## 2013-11-05 NOTE — Progress Notes (Signed)
Pre visit review using our clinic review tool, if applicable. No additional management support is needed unless otherwise documented below in the visit note. 

## 2013-11-26 ENCOUNTER — Other Ambulatory Visit: Payer: Self-pay | Admitting: Dermatology

## 2013-12-17 ENCOUNTER — Ambulatory Visit (INDEPENDENT_AMBULATORY_CARE_PROVIDER_SITE_OTHER): Payer: Medicare Other | Admitting: Family

## 2013-12-17 DIAGNOSIS — Z5181 Encounter for therapeutic drug level monitoring: Secondary | ICD-10-CM

## 2013-12-17 DIAGNOSIS — I4891 Unspecified atrial fibrillation: Secondary | ICD-10-CM

## 2013-12-17 LAB — POCT INR: INR: 1.9

## 2013-12-17 NOTE — Patient Instructions (Signed)
Take an extra 1/2 tab today only then continue 1 tablet daily except take 2 tablets on Wednesdays.  Re-check in 6 weeks.   Anticoagulation Dose Instructions as of 12/17/2013      Deanna [name] Tue Wed Thu Fri Sat   New Dose 1 mg 1 mg 1 mg 2 mg 1 mg 1 mg 1 mg    Description         Take an extra 1/2 tab today only then continue 1 tablet daily except take 2 tablets on Wednesdays.  Re-check in 6 weeks.

## 2013-12-20 ENCOUNTER — Other Ambulatory Visit: Payer: Self-pay | Admitting: Family Medicine

## 2014-01-07 ENCOUNTER — Other Ambulatory Visit: Payer: Self-pay | Admitting: Family

## 2014-01-27 ENCOUNTER — Other Ambulatory Visit: Payer: Self-pay | Admitting: Family Medicine

## 2014-01-28 ENCOUNTER — Ambulatory Visit (INDEPENDENT_AMBULATORY_CARE_PROVIDER_SITE_OTHER): Payer: Medicare Other | Admitting: Family

## 2014-01-28 DIAGNOSIS — Z5181 Encounter for therapeutic drug level monitoring: Secondary | ICD-10-CM

## 2014-01-28 DIAGNOSIS — I4891 Unspecified atrial fibrillation: Secondary | ICD-10-CM

## 2014-01-28 LAB — POCT INR: INR: 3.7

## 2014-01-28 NOTE — Patient Instructions (Signed)
Anticoagulation Dose Instructions as of 01/28/2014      Dorene Grebe Tue Wed Thu Fri Sat   New Dose 1 mg 1 mg 1 mg 2 mg 1 mg 1 mg 1 mg    Description        Hold Coumadin Friday only. Then continue 1 tablet daily except take 2 tablets on Wednesdays.  Re-check in 3 weeks.     Today take 2 pills. Continue taking 1 tablet every day except 1.5 tablets on Wednesdays and Fridays. Recheck in 3 weeks

## 2014-02-04 ENCOUNTER — Encounter: Payer: Self-pay | Admitting: Family Medicine

## 2014-02-04 ENCOUNTER — Ambulatory Visit (INDEPENDENT_AMBULATORY_CARE_PROVIDER_SITE_OTHER): Payer: Medicare Other | Admitting: Family Medicine

## 2014-02-04 VITALS — BP 130/80 | HR 68 | Temp 97.7°F | Wt 133.0 lb

## 2014-02-04 DIAGNOSIS — J018 Other acute sinusitis: Secondary | ICD-10-CM | POA: Diagnosis not present

## 2014-02-04 MED ORDER — AMOXICILLIN 875 MG PO TABS: 875.0000 mg | ORAL_TABLET | Freq: Two times a day (BID) | ORAL | Status: DC

## 2014-02-04 NOTE — Progress Notes (Signed)
Pre visit review using our clinic review tool, if applicable. No additional management support is needed unless otherwise documented below in the visit note. 

## 2014-02-04 NOTE — Progress Notes (Signed)
   Subjective:    Patient ID: Deanna [name], female    DOB: 09-06-1926, 81 y.o.   MRN: [id number]  HPI Acute visit. She's had about 2 week history of sinus congestion and pressure. 2 day history of some hoarseness intermittently. She's had cough which is occasionally productive. Increased facial pain. No headaches. Couple days she had some mild transient vertigo symptoms but none currently. She has tried some saline nasal irrigation without much improvement.  Past Medical History  Diagnosis Date  . Anxiety   . GERD (gastroesophageal reflux disease)   . Hyperlipidemia   . Hypertension   . Atrial fibrillation   . TRICUSPID REGURGITATION 10/23/2006  . Breast cancer   . Colon polyp 2009    TUBULAR ADENOMA  . Diverticulosis of colon (without mention of hemorrhage) 2009  . Barrett's esophagus   . Arthritis   . Status post dilation of esophageal narrowing    Past Surgical History  Procedure Laterality Date  . Abdominal hysterectomy    . Cataract extraction Bilateral   . Bcc-face    . Mastectomy Bilateral     reports that she has never smoked. She has never used smokeless tobacco. She reports that she drinks about 2.5 oz of alcohol per week. She reports that she does not use illicit drugs. family history includes Breast cancer in her other; Colon cancer in her father and mother; Colon polyps in her son; Hypertension in her mother. Allergies  Allergen Reactions  . Aspirin     REACTION: nervousness---tolerates ibuprofen      Review of Systems  Constitutional: Positive for fatigue. Negative for fever and chills.  HENT: Positive for congestion, sinus pressure and voice change. Negative for trouble swallowing.   Respiratory: Positive for cough.   Neurological: Negative for headaches.       Objective:   Physical Exam  Constitutional: She appears well-developed and well-nourished.  HENT:  Right Ear: External ear normal.  Left Ear: External ear normal.  Mouth/Throat: Oropharynx  is clear and moist.  Nasal mucosa erythematous, otherwise unremarkable  Neck: Neck supple. No thyromegaly present.  Cardiovascular: Normal rate and regular rhythm.   Pulmonary/Chest: Effort normal and breath sounds normal. No respiratory distress. She has no wheezes. She has no rales.          Assessment & Plan:  Probable acute sinusitis. Question viral versus early bacterial -if no better in a few days consider start amoxicillin 875 mg twice a day for 10 days with close follow-up regarding her INR

## 2014-02-04 NOTE — Patient Instructions (Signed)

## 2014-02-18 ENCOUNTER — Ambulatory Visit (INDEPENDENT_AMBULATORY_CARE_PROVIDER_SITE_OTHER): Payer: Medicare Other | Admitting: General Practice

## 2014-02-18 DIAGNOSIS — Z5181 Encounter for therapeutic drug level monitoring: Secondary | ICD-10-CM

## 2014-02-18 LAB — POCT INR: INR: 1.9

## 2014-02-18 NOTE — Progress Notes (Signed)
Pre visit review using our clinic review tool, if applicable. No additional management support is needed unless otherwise documented below in the visit note. 

## 2014-03-18 ENCOUNTER — Telehealth: Payer: Self-pay | Admitting: Family Medicine

## 2014-03-18 ENCOUNTER — Ambulatory Visit (INDEPENDENT_AMBULATORY_CARE_PROVIDER_SITE_OTHER): Payer: Medicare Other | Admitting: Family

## 2014-03-18 ENCOUNTER — Ambulatory Visit: Payer: PRIVATE HEALTH INSURANCE

## 2014-03-18 DIAGNOSIS — Z5181 Encounter for therapeutic drug level monitoring: Secondary | ICD-10-CM | POA: Diagnosis not present

## 2014-03-18 DIAGNOSIS — I482 Chronic atrial fibrillation, unspecified: Secondary | ICD-10-CM

## 2014-03-18 DIAGNOSIS — I4891 Unspecified atrial fibrillation: Secondary | ICD-10-CM | POA: Diagnosis not present

## 2014-03-18 LAB — POCT INR: INR: 2

## 2014-03-18 MED ORDER — DIGOXIN 125 MCG PO TABS: ORAL_TABLET | ORAL | Status: DC

## 2014-03-18 MED ORDER — SPIRONOLACTONE 50 MG PO TABS: 50.0000 mg | ORAL_TABLET | Freq: Every day | ORAL | Status: DC

## 2014-03-18 NOTE — Telephone Encounter (Signed)
Deanna [name] needs medications refilled: DIGOX 125 MCG tablet, spironolactone (ALDACTONE) 50 MG tablet, warfarin (COUMADIN) 1 MG tablet, and ALLOPURINOL (ZYLOPRIM) TAB 300 MG. The ALLOPURINOL wasn't on her medication list but the bottle had Dr. Elease Hashimoto on it.

## 2014-03-18 NOTE — Telephone Encounter (Signed)
If she has consistently been taking the Allopurinol would continue. Refill all meds OK.

## 2014-03-18 NOTE — Patient Instructions (Addendum)
continue 1 tablet daily except take 2 tablets on Wednesdays.  Re-check in 6 weeks patient request.   Anticoagulation Dose Instructions as of 03/18/2014      Dorene Grebe Tue Wed Thu Fri Sat   New Dose 1 mg 1 mg 1 mg 2 mg 1 mg 1 mg 1 mg    Description        continue 1 tablet daily except take 2 tablets on Wednesdays.  Re-check in 4 weeks.

## 2014-03-18 NOTE — Telephone Encounter (Signed)
Is patient suppose to be on Allopurinol

## 2014-03-18 NOTE — Telephone Encounter (Signed)
Refill Warfarin

## 2014-03-22 ENCOUNTER — Other Ambulatory Visit: Payer: Self-pay | Admitting: General Practice

## 2014-03-22 ENCOUNTER — Telehealth: Payer: Self-pay

## 2014-03-22 MED ORDER — SPIRONOLACTONE 50 MG PO TABS
50.0000 mg | ORAL_TABLET | Freq: Every day | ORAL | Status: DC
Start: 2014-03-22 — End: 2014-06-22

## 2014-03-22 MED ORDER — WARFARIN SODIUM 1 MG PO TABS: ORAL_TABLET | ORAL | Status: DC

## 2014-03-22 MED ORDER — ALLOPURINOL 300 MG PO TABS: 300.0000 mg | ORAL_TABLET | Freq: Every day | ORAL | Status: DC

## 2014-03-22 MED ORDER — ALPRAZOLAM 0.5 MG PO TABS: 0.5000 mg | ORAL_TABLET | Freq: Every evening | ORAL | Status: DC | PRN

## 2014-03-22 MED ORDER — DIGOXIN 125 MCG PO TABS: 0.1250 mg | ORAL_TABLET | Freq: Every day | ORAL | Status: DC

## 2014-03-22 NOTE — Telephone Encounter (Signed)
Rx changed and called into pharmacy

## 2014-03-22 NOTE — Telephone Encounter (Signed)
Refill Xanax Last visit 02/04/14 Last refill 10/27/13 #30 5 refill  Pt wants to know if we can change the word Sleep to Anxiety on her Xanax so her insurance can pay for it.  [facility]

## 2014-03-22 NOTE — Telephone Encounter (Signed)
We can add anxiety as dx- she does have hx of some chronic anxiety.

## 2014-03-25 ENCOUNTER — Telehealth: Payer: Self-pay | Admitting: Family Medicine

## 2014-03-25 MED ORDER — ALPRAZOLAM 0.5 MG PO TABS: 0.5000 mg | ORAL_TABLET | Freq: Every evening | ORAL | Status: DC | PRN

## 2014-03-25 NOTE — Telephone Encounter (Signed)
Patient states ALPRAZolam (XANAX) 0.5 MG tablet needs to [place], [place] - [address]. She needs 90 day re-fill.

## 2014-03-25 NOTE — Telephone Encounter (Signed)
Refill OK

## 2014-03-25 NOTE — Telephone Encounter (Signed)
Last visit 02/04/14 Last refill 03/22/14 #30 0 refill

## 2014-03-25 NOTE — Telephone Encounter (Signed)
Faxed Rx to pharmacy  

## 2014-04-12 ENCOUNTER — Other Ambulatory Visit: Payer: Self-pay | Admitting: Family Medicine

## 2014-04-12 NOTE — Telephone Encounter (Signed)
Last visit 02/04/14 Last refill 09/02/12 #60 0 refill

## 2014-04-12 NOTE — Telephone Encounter (Signed)
Refill once 

## 2014-04-29 ENCOUNTER — Ambulatory Visit (INDEPENDENT_AMBULATORY_CARE_PROVIDER_SITE_OTHER): Payer: Medicare Other | Admitting: General Practice

## 2014-04-29 DIAGNOSIS — Z5181 Encounter for therapeutic drug level monitoring: Secondary | ICD-10-CM | POA: Diagnosis not present

## 2014-04-29 LAB — POCT INR: INR: 2.1

## 2014-04-29 NOTE — Progress Notes (Signed)
Pre visit review using our clinic review tool, if applicable. No additional management support is needed unless otherwise documented below in the visit note. 

## 2014-05-13 ENCOUNTER — Ambulatory Visit (INDEPENDENT_AMBULATORY_CARE_PROVIDER_SITE_OTHER): Payer: Medicare Other | Admitting: Family Medicine

## 2014-05-13 ENCOUNTER — Encounter: Payer: Self-pay | Admitting: Family Medicine

## 2014-05-13 VITALS — BP 128/80 | HR 85 | Temp 98.0°F | Wt 134.0 lb

## 2014-05-13 DIAGNOSIS — I4891 Unspecified atrial fibrillation: Secondary | ICD-10-CM

## 2014-05-13 DIAGNOSIS — M609 Myositis, unspecified: Secondary | ICD-10-CM

## 2014-05-13 DIAGNOSIS — M791 Myalgia: Secondary | ICD-10-CM

## 2014-05-13 DIAGNOSIS — E785 Hyperlipidemia, unspecified: Secondary | ICD-10-CM | POA: Diagnosis not present

## 2014-05-13 DIAGNOSIS — IMO0001 Reserved for inherently not codable concepts without codable children: Secondary | ICD-10-CM

## 2014-05-13 DIAGNOSIS — M1000 Idiopathic gout, unspecified site: Secondary | ICD-10-CM

## 2014-05-13 DIAGNOSIS — I10 Essential (primary) hypertension: Secondary | ICD-10-CM

## 2014-05-13 NOTE — Progress Notes (Signed)
Pre visit review using our clinic review tool, if applicable. No additional management support is needed unless otherwise documented below in the visit note. 

## 2014-05-13 NOTE — Progress Notes (Signed)
   Subjective:    Patient ID: Deanna [name], female    DOB: Sep 28, 1926, 81 y.o.   MRN: [id number]  HPI Patient seen for the following  Bilateral shoulder pains and occasional myalgias upper thighs. Symptoms are somewhat inconsistent. She is concerned this may be related to atorvastatin. She's been on this for several years. She also takes fenofibrate. She does not have any significant neck pain. No complaints of fatigue. No exacerbating or alleviating factors  She has history of reported "gout". Not sure how well this is been substantiated. She has doubts because she states that when she had issues with toe pain her symptoms are actually improved with topical pressure. She denies ever having any red or hot to touch joints.  Hypertension which is stable.  She takes lisinopril and Aldactone.   Atrial fibrillation on Coumadin. No recent dizziness. No falls. No chest pains or dyspnea.   Past Medical History  Diagnosis Date  . Anxiety   . GERD (gastroesophageal reflux disease)   . Hyperlipidemia   . Hypertension   . Atrial fibrillation   . TRICUSPID REGURGITATION 10/23/2006  . Breast cancer   . Colon polyp 2009    TUBULAR ADENOMA  . Diverticulosis of colon (without mention of hemorrhage) 2009  . Barrett's esophagus   . Arthritis   . Status post dilation of esophageal narrowing    Past Surgical History  Procedure Laterality Date  . Abdominal hysterectomy    . Cataract extraction Bilateral   . Bcc-face    . Mastectomy Bilateral     reports that she has never smoked. She has never used smokeless tobacco. She reports that she drinks about 2.5 oz of alcohol per week. She reports that she does not use illicit drugs. family history includes Breast cancer in her other; Colon cancer in her father and mother; Colon polyps in her son; Hypertension in her mother. Allergies  Allergen Reactions  . Aspirin     REACTION: nervousness---tolerates ibuprofen      Review of Systems    Constitutional: Negative for fatigue.  Eyes: Negative for visual disturbance.  Respiratory: Negative for cough, chest tightness, shortness of breath and wheezing.   Cardiovascular: Negative for chest pain, palpitations and leg swelling.  Genitourinary: Negative for dysuria.  Musculoskeletal: Positive for myalgias and arthralgias.  Neurological: Negative for dizziness, seizures, syncope, weakness, light-headedness and headaches.       Objective:   Physical Exam  Constitutional: She appears well-developed and well-nourished.  Neck: Neck supple.  Cardiovascular: Normal rate.   Pulmonary/Chest: Effort normal and breath sounds normal. No respiratory distress. She has no wheezes. She has no rales.  Abdominal: Soft. There is no tenderness.  Musculoskeletal: She exhibits no edema.  Lymphadenopathy:    She has no cervical adenopathy.  Neurological: She is alert.  Psychiatric: She has a normal mood and affect. Her behavior is normal.          Assessment & Plan:  #1 myalgias. She has occasional myalgias upper thighs and shoulders. Doubt PMR clinically. Not sure whether this is related to statin use. She'll try holding atorvastatin for 3-4 weeks. If symptoms fully resolve off atorvastatin will consider other alternatives such as pravastatin #2 hypertension stable and at goal #3 history of reported gout. Not well substantiated. Discontinue allopurinol. #4 hyperlipidemia as above. If symptoms above not improved after holding statin for a few weeks will go back on Lipitor and repeat lipids and hepatic panel at follow-up in 3 months

## 2014-05-13 NOTE — Patient Instructions (Signed)
Stop the Allopurinol Hold the Atorvastatin for now.  In 3 weeks if shoulder pain no better start back the Atorvastatin.  If pain resolved in 3 weeks then let me know and we will change medication.

## 2014-06-03 ENCOUNTER — Telehealth: Payer: Self-pay

## 2014-06-03 ENCOUNTER — Other Ambulatory Visit: Payer: Self-pay | Admitting: Family Medicine

## 2014-06-03 DIAGNOSIS — E785 Hyperlipidemia, unspecified: Secondary | ICD-10-CM

## 2014-06-03 MED ORDER — PRAVASTATIN SODIUM 40 MG PO TABS: 40.0000 mg | ORAL_TABLET | Freq: Every day | ORAL | Status: DC

## 2014-06-03 NOTE — Telephone Encounter (Signed)
Pt states that her shoulder is feeling better and that she would like to try the new medication Pravastatin. Pt states that it has been 3 weeks. Pt also states that she is still taking her Allopurinol.   [facility]

## 2014-06-03 NOTE — Telephone Encounter (Signed)
Rx sent to pharmacy and lab is ordered.

## 2014-06-03 NOTE — Telephone Encounter (Signed)
D/c Atorvastatin and start Pravastatin 40 mg once daily and repeat lipids and hepatic in 2 months.

## 2014-06-10 ENCOUNTER — Ambulatory Visit: Payer: Medicare Other

## 2014-06-17 ENCOUNTER — Other Ambulatory Visit: Payer: Self-pay | Admitting: General Practice

## 2014-06-17 ENCOUNTER — Ambulatory Visit (INDEPENDENT_AMBULATORY_CARE_PROVIDER_SITE_OTHER): Payer: Medicare Other | Admitting: General Practice

## 2014-06-17 DIAGNOSIS — Z5181 Encounter for therapeutic drug level monitoring: Secondary | ICD-10-CM

## 2014-06-17 DIAGNOSIS — I4891 Unspecified atrial fibrillation: Secondary | ICD-10-CM

## 2014-06-17 LAB — POCT INR: INR: 1.8

## 2014-06-17 MED ORDER — PRAVASTATIN SODIUM 40 MG PO TABS: 40.0000 mg | ORAL_TABLET | Freq: Every day | ORAL | Status: DC

## 2014-06-17 NOTE — Progress Notes (Signed)
Pre visit review using our clinic review tool, if applicable. No additional management support is needed unless otherwise documented below in the visit note. 

## 2014-06-21 ENCOUNTER — Telehealth: Payer: Self-pay | Admitting: Family Medicine

## 2014-06-21 NOTE — Telephone Encounter (Signed)
Patient came in for an RX refill on Lisinopril 20mg , Fenofibrate 160mg , Spironolactone 50mg  and Alprazolam 0.5mg  sent to [facility] mail order 90 days supply.

## 2014-06-22 MED ORDER — SPIRONOLACTONE 50 MG PO TABS: 50.0000 mg | ORAL_TABLET | Freq: Every day | ORAL | Status: DC

## 2014-06-22 MED ORDER — LISINOPRIL 20 MG PO TABS: 20.0000 mg | ORAL_TABLET | Freq: Two times a day (BID) | ORAL | Status: DC

## 2014-06-22 MED ORDER — FENOFIBRATE 160 MG PO TABS: ORAL_TABLET | ORAL | Status: DC

## 2014-06-22 NOTE — Telephone Encounter (Signed)
Xanax  Last visit 05/13/14 Last refill 03/25/14 #90 0 refill

## 2014-06-22 NOTE — Telephone Encounter (Signed)
Refills OK. 

## 2014-06-23 MED ORDER — ALPRAZOLAM 0.5 MG PO TABS: 0.5000 mg | ORAL_TABLET | Freq: Every evening | ORAL | Status: DC | PRN

## 2014-06-23 NOTE — Telephone Encounter (Signed)
Rx's sent to mail order.  

## 2014-06-28 ENCOUNTER — Other Ambulatory Visit: Payer: Self-pay | Admitting: Family Medicine

## 2014-07-15 ENCOUNTER — Ambulatory Visit (INDEPENDENT_AMBULATORY_CARE_PROVIDER_SITE_OTHER): Payer: Medicare Other | Admitting: General Practice

## 2014-07-15 DIAGNOSIS — I4891 Unspecified atrial fibrillation: Secondary | ICD-10-CM

## 2014-07-15 DIAGNOSIS — Z5181 Encounter for therapeutic drug level monitoring: Secondary | ICD-10-CM

## 2014-07-15 LAB — POCT INR: INR: 2.5

## 2014-07-15 NOTE — Progress Notes (Signed)
Pre visit review using our clinic review tool, if applicable. No additional management support is needed unless otherwise documented below in the visit note. 

## 2014-08-03 ENCOUNTER — Other Ambulatory Visit: Payer: Self-pay | Admitting: Family Medicine

## 2014-08-16 ENCOUNTER — Ambulatory Visit (INDEPENDENT_AMBULATORY_CARE_PROVIDER_SITE_OTHER): Payer: Medicare Other | Admitting: Family Medicine

## 2014-08-16 ENCOUNTER — Encounter: Payer: Self-pay | Admitting: Family Medicine

## 2014-08-16 VITALS — BP 130/70 | HR 80 | Temp 97.7°F | Wt 133.0 lb

## 2014-08-16 DIAGNOSIS — E785 Hyperlipidemia, unspecified: Secondary | ICD-10-CM | POA: Diagnosis not present

## 2014-08-16 LAB — LIPID PANEL
Cholesterol: 246 mg/dL — ABNORMAL HIGH (ref 0–200)
HDL: 30.9 mg/dL — ABNORMAL LOW (ref >39.00)
Total CHOL/HDL Ratio: 8
Triglycerides: 648 mg/dL — ABNORMAL HIGH (ref 0.0–149.0)

## 2014-08-16 LAB — HEPATIC FUNCTION PANEL
ALT: 23 U/L (ref 0–35)
AST: 29 U/L (ref 0–37)
Albumin: 4.4 g/dL (ref 3.5–5.2)
Alkaline Phosphatase: 46 U/L (ref 39–117)
Bilirubin, Direct: 0.2 mg/dL (ref 0.0–0.3)
Total Bilirubin: 0.8 mg/dL (ref 0.2–1.2)
Total Protein: 7.5 g/dL (ref 6.0–8.3)

## 2014-08-16 LAB — LDL CHOLESTEROL, DIRECT: Direct LDL: 115 mg/dL

## 2014-08-16 NOTE — Progress Notes (Signed)
   Subjective:    Patient ID: Deanna [name], female    DOB: 1926-01-12, 81 y.o.   MRN: [id number]  HPI  Patient seen for follow-up regarding hyperlipidemia. She had been on atorvastatin but had myalgias which went away after she stopped this medication. We placed her on pravastatin and she's had no difficulty. She's been compliant with therapy. Her other medical problems include hypertension, atrial fibrillation, osteoarthritis, GERD, history of gout. No recent chest pains  Past Medical History  Diagnosis Date  . Anxiety   . GERD (gastroesophageal reflux disease)   . Hyperlipidemia   . Hypertension   . Atrial fibrillation   . TRICUSPID REGURGITATION 10/23/2006  . Breast cancer   . Colon polyp 2009    TUBULAR ADENOMA  . Diverticulosis of colon (without mention of hemorrhage) 2009  . Barrett's esophagus   . Arthritis   . Status post dilation of esophageal narrowing    Past Surgical History  Procedure Laterality Date  . Abdominal hysterectomy    . Cataract extraction Bilateral   . Bcc-face    . Mastectomy Bilateral     reports that she has never smoked. She has never used smokeless tobacco. She reports that she drinks about 2.5 oz of alcohol per week. She reports that she does not use illicit drugs. family history includes Breast cancer in her other; Colon cancer in her father and mother; Colon polyps in her son; Hypertension in her mother. Allergies  Allergen Reactions  . Aspirin     REACTION: nervousness---tolerates ibuprofen  . Atorvastatin Other (See Comments)    myalgia     Review of Systems  Constitutional: Negative for fatigue and unexpected weight change.  Eyes: Negative for visual disturbance.  Respiratory: Negative for cough, chest tightness, shortness of breath and wheezing.   Cardiovascular: Negative for chest pain, palpitations and leg swelling.  Neurological: Negative for dizziness, seizures, syncope, weakness, light-headedness and headaches.         Objective:   Physical Exam  Constitutional: She appears well-developed and well-nourished. No distress.  Cardiovascular: Normal rate.   Pulmonary/Chest: Effort normal and breath sounds normal. No respiratory distress. She has no wheezes. She has no rales.  Musculoskeletal: She exhibits no edema.          Assessment & Plan:  Hyperlipidemia. Patient had myalgias on atorvastatin but has done well with pravastatin with no side effects. Recheck lipid and hepatic panel

## 2014-08-16 NOTE — Addendum Note (Signed)
Addended by: Denna Haggard K on: 08/16/2014 10:14 AM   Modules accepted: Orders

## 2014-08-16 NOTE — Progress Notes (Signed)
Pre visit review using our clinic review tool, if applicable. No additional management support is needed unless otherwise documented below in the visit note. 

## 2014-08-26 ENCOUNTER — Ambulatory Visit (INDEPENDENT_AMBULATORY_CARE_PROVIDER_SITE_OTHER): Payer: Medicare Other | Admitting: General Practice

## 2014-08-26 DIAGNOSIS — I4891 Unspecified atrial fibrillation: Secondary | ICD-10-CM

## 2014-08-26 DIAGNOSIS — Z5181 Encounter for therapeutic drug level monitoring: Secondary | ICD-10-CM

## 2014-08-26 LAB — POCT INR: INR: 2.4

## 2014-08-26 NOTE — Progress Notes (Signed)
Pre visit review using our clinic review tool, if applicable. No additional management support is needed unless otherwise documented below in the visit note. 

## 2014-09-11 ENCOUNTER — Other Ambulatory Visit: Payer: Self-pay | Admitting: Family Medicine

## 2014-09-20 ENCOUNTER — Telehealth: Payer: Self-pay

## 2014-09-20 MED ORDER — ALPRAZOLAM 0.5 MG PO TABS: 0.5000 mg | ORAL_TABLET | Freq: Every evening | ORAL | Status: DC | PRN

## 2014-09-20 NOTE — Telephone Encounter (Signed)
Refill okay?  

## 2014-09-20 NOTE — Telephone Encounter (Signed)
Faxed Rx to mail order.  

## 2014-09-20 NOTE — Telephone Encounter (Signed)
Xanax  Last visit 08/16/14 Last refill 06/23/14 #90 0 refill   [facility]

## 2014-09-20 NOTE — Addendum Note (Signed)
Addended by: Marcina Millard on: 09/20/2014 04:59 PM   Modules accepted: Orders

## 2014-10-07 ENCOUNTER — Ambulatory Visit: Payer: Medicare Other

## 2014-10-14 ENCOUNTER — Ambulatory Visit: Payer: Medicare Other

## 2014-10-21 ENCOUNTER — Ambulatory Visit: Payer: Medicare Other | Admitting: *Deleted

## 2014-10-21 ENCOUNTER — Ambulatory Visit (INDEPENDENT_AMBULATORY_CARE_PROVIDER_SITE_OTHER): Payer: Medicare Other | Admitting: General Practice

## 2014-10-21 DIAGNOSIS — I4891 Unspecified atrial fibrillation: Secondary | ICD-10-CM | POA: Diagnosis not present

## 2014-10-21 DIAGNOSIS — Z23 Encounter for immunization: Secondary | ICD-10-CM

## 2014-10-21 DIAGNOSIS — Z5181 Encounter for therapeutic drug level monitoring: Secondary | ICD-10-CM

## 2014-10-21 LAB — POCT INR: INR: 2.3

## 2014-10-21 NOTE — Progress Notes (Signed)
Pre visit review using our clinic review tool, if applicable. No additional management support is needed unless otherwise documented below in the visit note. 

## 2014-11-22 ENCOUNTER — Other Ambulatory Visit: Payer: Self-pay | Admitting: Family Medicine

## 2014-12-06 ENCOUNTER — Other Ambulatory Visit: Payer: Self-pay | Admitting: General Practice

## 2014-12-06 ENCOUNTER — Ambulatory Visit (INDEPENDENT_AMBULATORY_CARE_PROVIDER_SITE_OTHER): Payer: Medicare Other | Admitting: General Practice

## 2014-12-06 DIAGNOSIS — I4891 Unspecified atrial fibrillation: Secondary | ICD-10-CM | POA: Diagnosis not present

## 2014-12-06 DIAGNOSIS — Z85828 Personal history of other malignant neoplasm of skin: Secondary | ICD-10-CM | POA: Diagnosis not present

## 2014-12-06 DIAGNOSIS — L821 Other seborrheic keratosis: Secondary | ICD-10-CM | POA: Diagnosis not present

## 2014-12-06 DIAGNOSIS — H61002 Unspecified perichondritis of left external ear: Secondary | ICD-10-CM | POA: Diagnosis not present

## 2014-12-06 DIAGNOSIS — L570 Actinic keratosis: Secondary | ICD-10-CM | POA: Diagnosis not present

## 2014-12-06 DIAGNOSIS — Z5181 Encounter for therapeutic drug level monitoring: Secondary | ICD-10-CM

## 2014-12-06 DIAGNOSIS — L814 Other melanin hyperpigmentation: Secondary | ICD-10-CM | POA: Diagnosis not present

## 2014-12-06 DIAGNOSIS — C44319 Basal cell carcinoma of skin of other parts of face: Secondary | ICD-10-CM | POA: Diagnosis not present

## 2014-12-06 LAB — POCT INR: INR: 2.7

## 2014-12-06 MED ORDER — WARFARIN SODIUM 1 MG PO TABS: ORAL_TABLET | ORAL | Status: DC

## 2014-12-06 NOTE — Progress Notes (Signed)
Pre visit review using our clinic review tool, if applicable. No additional management support is needed unless otherwise documented below in the visit note. 

## 2014-12-10 ENCOUNTER — Other Ambulatory Visit: Payer: Self-pay | Admitting: Family Medicine

## 2014-12-13 NOTE — Telephone Encounter (Signed)
Refill OK

## 2014-12-14 MED ORDER — ALPRAZOLAM 0.5 MG PO TABS: 0.5000 mg | ORAL_TABLET | Freq: Every evening | ORAL | Status: DC | PRN

## 2014-12-17 ENCOUNTER — Other Ambulatory Visit: Payer: Self-pay | Admitting: Family Medicine

## 2014-12-20 DIAGNOSIS — C44311 Basal cell carcinoma of skin of nose: Secondary | ICD-10-CM | POA: Diagnosis not present

## 2014-12-27 DIAGNOSIS — Z4802 Encounter for removal of sutures: Secondary | ICD-10-CM | POA: Diagnosis not present

## 2014-12-29 ENCOUNTER — Other Ambulatory Visit: Payer: Self-pay | Admitting: Family Medicine

## 2015-01-17 ENCOUNTER — Ambulatory Visit (INDEPENDENT_AMBULATORY_CARE_PROVIDER_SITE_OTHER): Payer: Medicare Other | Admitting: General Practice

## 2015-01-17 DIAGNOSIS — Z5181 Encounter for therapeutic drug level monitoring: Secondary | ICD-10-CM

## 2015-01-17 DIAGNOSIS — I4891 Unspecified atrial fibrillation: Secondary | ICD-10-CM | POA: Diagnosis not present

## 2015-01-17 LAB — POCT INR: INR: 2.6

## 2015-01-17 NOTE — Progress Notes (Signed)
Pre visit review using our clinic review tool, if applicable. No additional management support is needed unless otherwise documented below in the visit note. 

## 2015-01-21 ENCOUNTER — Ambulatory Visit (INDEPENDENT_AMBULATORY_CARE_PROVIDER_SITE_OTHER): Payer: Medicare Other | Admitting: Family Medicine

## 2015-01-21 VITALS — BP 168/86 | HR 88 | Temp 98.4°F | Ht 62.0 in | Wt 133.9 lb

## 2015-01-21 DIAGNOSIS — I4891 Unspecified atrial fibrillation: Secondary | ICD-10-CM | POA: Diagnosis not present

## 2015-01-21 DIAGNOSIS — I10 Essential (primary) hypertension: Secondary | ICD-10-CM | POA: Diagnosis not present

## 2015-01-21 DIAGNOSIS — R059 Cough, unspecified: Secondary | ICD-10-CM

## 2015-01-21 DIAGNOSIS — R05 Cough: Secondary | ICD-10-CM

## 2015-01-21 LAB — BASIC METABOLIC PANEL
BUN: 23 mg/dL (ref 7–25)
CO2: 26 mmol/L (ref 20–31)
Calcium: 10.8 mg/dL — ABNORMAL HIGH (ref 8.6–10.4)
Chloride: 100 mmol/L (ref 98–110)
Creat: 1.2 mg/dL — ABNORMAL HIGH (ref 0.60–0.88)
Glucose, Bld: 137 mg/dL — ABNORMAL HIGH (ref 65–99)
Potassium: 5.5 mmol/L — ABNORMAL HIGH (ref 3.5–5.3)
Sodium: 138 mmol/L (ref 135–146)

## 2015-01-21 NOTE — Progress Notes (Signed)
   Subjective:    Patient ID: Deanna [name], female    DOB: May 26, 1926, 82 y.o.   MRN: [id number]  HPI Patient seen for the following issues  2 week history of cough. Productive of clear sputum. No fevers or chills. No dyspnea. No wheezing. Never smoked. She's had some mild nasal congestion. Taking Mucinex and over-the-counter cough medications. Denies any GERD symptoms. No hemoptysis. No pleuritic pain.  Chronic atrial fibrillation. She is on Coumadin. No recent bleeding complications. She gets her Coumadin monitored regularly  Hypertension. She is currently on regimen of lisinopril 20 mg daily and Aldactone. She's not had recent electrolytes. No peripheral edema. No dizziness. No headaches. No chest pains.  Past Medical History  Diagnosis Date  . Anxiety   . GERD (gastroesophageal reflux disease)   . Hyperlipidemia   . Hypertension   . Atrial fibrillation   . TRICUSPID REGURGITATION 10/23/2006  . Breast cancer   . Colon polyp 2009    TUBULAR ADENOMA  . Diverticulosis of colon (without mention of hemorrhage) 2009  . Barrett's esophagus   . Arthritis   . Status post dilation of esophageal narrowing    Past Surgical History  Procedure Laterality Date  . Abdominal hysterectomy    . Cataract extraction Bilateral   . Bcc-face    . Mastectomy Bilateral     reports that she has never smoked. She has never used smokeless tobacco. She reports that she drinks about 2.5 oz of alcohol per week. She reports that she does not use illicit drugs. family history includes Breast cancer in her other; Colon cancer in her father and mother; Colon polyps in her son; Hypertension in her mother. Allergies  Allergen Reactions  . Aspirin     REACTION: nervousness---tolerates ibuprofen  . Atorvastatin Other (See Comments)    myalgia      Review of Systems  Constitutional: Negative for chills, appetite change, fatigue and unexpected weight change.  HENT: Positive for congestion.   Eyes:  Negative for visual disturbance.  Respiratory: Positive for cough. Negative for chest tightness, shortness of breath and wheezing.   Cardiovascular: Negative for chest pain, palpitations and leg swelling.  Neurological: Negative for dizziness, seizures, syncope, weakness, light-headedness and headaches.       Objective:   Physical Exam  Constitutional: She appears well-developed and well-nourished.  HENT:  Right Ear: External ear normal.  Left Ear: External ear normal.  Mouth/Throat: Oropharynx is clear and moist.  Neck: Neck supple.  Cardiovascular: Normal rate.   Pulmonary/Chest: Effort normal and breath sounds normal. No respiratory distress. She has no wheezes. She has no rales.  Musculoskeletal: She exhibits no edema.  Lymphadenopathy:    She has no cervical adenopathy.  Neurological: She is alert.          Assessment & Plan:  #1 cough. Nonfocal exam. Suspect acute viral bronchitis. Continue over-the-counter Mucinex. Follow-up for any fever or shortness of breath. Touch base if cough not resolving over the next couple of weeks  #2 hypertension. Poorly controlled by today's reading. Generally well-controlled. Monitor closely at home and be in touch if consistently greater than 150/90. We need to monitor her basic metabolic panel with combination of lisinopril and Aldactone to rule out hyperkalemia. If blood pressure remains elevated consider reducing Aldactone and starting amlodipine  #3 atrial fibrillation. Stable. Continue close monitoring of INR

## 2015-01-21 NOTE — Patient Instructions (Signed)
Acute Bronchitis Bronchitis is inflammation of the airways that extend from the windpipe into the lungs (bronchi). The inflammation often causes mucus to develop. This leads to a cough, which is the most common symptom of bronchitis.  In acute bronchitis, the condition usually develops suddenly and goes away over time, usually in a couple weeks. Smoking, allergies, and asthma can make bronchitis worse. Repeated episodes of bronchitis may cause further lung problems.  CAUSES Acute bronchitis is most often caused by the same virus that causes a cold. The virus can spread from person to person (contagious) through coughing, sneezing, and touching contaminated objects. SIGNS AND SYMPTOMS   Cough.   Fever.   Coughing up mucus.   Body aches.   Chest congestion.   Chills.   Shortness of breath.   Sore throat.  DIAGNOSIS  Acute bronchitis is usually diagnosed through a physical exam. Your health care provider will also ask you questions about your medical history. Tests, such as chest X-rays, are sometimes done to rule out other conditions.  TREATMENT  Acute bronchitis usually goes away in a couple weeks. Oftentimes, no medical treatment is necessary. Medicines are sometimes given for relief of fever or cough. Antibiotic medicines are usually not needed but may be prescribed in certain situations. In some cases, an inhaler may be recommended to help reduce shortness of breath and control the cough. A cool mist vaporizer may also be used to help thin bronchial secretions and make it easier to clear the chest.  HOME CARE INSTRUCTIONS  Get plenty of rest.   Drink enough fluids to keep your urine clear or pale yellow (unless you have a medical condition that requires fluid restriction). Increasing fluids may help thin your respiratory secretions (sputum) and reduce chest congestion, and it will prevent dehydration.   Take medicines only as directed by your health care provider.  If  you were prescribed an antibiotic medicine, finish it all even if you start to feel better.  Avoid smoking and secondhand smoke. Exposure to cigarette smoke or irritating chemicals will make bronchitis worse. If you are a smoker, consider using nicotine gum or skin patches to help control withdrawal symptoms. Quitting smoking will help your lungs heal faster.   Reduce the chances of another bout of acute bronchitis by washing your hands frequently, avoiding people with cold symptoms, and trying not to touch your hands to your mouth, nose, or eyes.   Keep all follow-up visits as directed by your health care provider.  SEEK MEDICAL CARE IF: Your symptoms do not improve after 1 week of treatment.  SEEK IMMEDIATE MEDICAL CARE IF:  You develop an increased fever or chills.   You have chest pain.   You have severe shortness of breath.  You have bloody sputum.   You develop dehydration.  You faint or repeatedly feel like you are going to pass out.  You develop repeated vomiting.  You develop a severe headache. MAKE SURE YOU:   Understand these instructions.  Will watch your condition.  Will get help right away if you are not doing well or get worse.   This information is not intended to replace advice given to you by your health care provider. Make sure you discuss any questions you have with your health care provider.   Document Released: 02/02/2004 Document Revised: 01/15/2014 Document Reviewed: 06/17/2012 Elsevier Interactive Patient Education 2016 [place].  Monitor blood pressure and be in touch if consistently > 150/90

## 2015-01-21 NOTE — Progress Notes (Signed)
Pre visit review using our clinic review tool, if applicable. No additional management support is needed unless otherwise documented below in the visit note. 

## 2015-02-09 ENCOUNTER — Encounter: Payer: Self-pay | Admitting: Family Medicine

## 2015-02-09 ENCOUNTER — Ambulatory Visit (INDEPENDENT_AMBULATORY_CARE_PROVIDER_SITE_OTHER): Payer: Medicare Other | Admitting: Family Medicine

## 2015-02-09 VITALS — BP 160/90 | HR 89 | Temp 98.4°F | Ht 62.0 in | Wt 131.1 lb

## 2015-02-09 DIAGNOSIS — I10 Essential (primary) hypertension: Secondary | ICD-10-CM

## 2015-02-09 DIAGNOSIS — E875 Hyperkalemia: Secondary | ICD-10-CM | POA: Diagnosis not present

## 2015-02-09 LAB — BASIC METABOLIC PANEL
BUN: 24 mg/dL — ABNORMAL HIGH (ref 6–23)
CO2: 29 mEq/L (ref 19–32)
Calcium: 11.1 mg/dL — ABNORMAL HIGH (ref 8.4–10.5)
Chloride: 97 mEq/L (ref 96–112)
Creatinine, Ser: 1.02 mg/dL (ref 0.40–1.20)
GFR: 54.54 mL/min — ABNORMAL LOW (ref >60.00)
Glucose, Bld: 111 mg/dL — ABNORMAL HIGH (ref 70–99)
Potassium: 4.1 mEq/L (ref 3.5–5.1)
Sodium: 137 mEq/L (ref 135–145)

## 2015-02-09 NOTE — Progress Notes (Signed)
   Subjective:    Patient ID: Deanna [name], female    DOB: December 26, 1926, 82 y.o.   MRN: [id number]  HPI  follow-up hypertension and recent mild hyperkalemia. She had recent potassium 5.5. We reduced her Aldactone. She has had consistently high readings here in the office but consistently good readings at home. She brings in a large number of readings of the past few weeks and also her blood pressure cuff today. Most of her readings are below [id number] systolic. We did obtain very similar reading with her automated cuff with ours today. She's not any headaches. She remains on lisinopril and Aldactone. She's been on this  combination apparently for years  Past Medical History  Diagnosis Date  . Anxiety   . GERD (gastroesophageal reflux disease)   . Hyperlipidemia   . Hypertension   . Atrial fibrillation ([place])   . TRICUSPID REGURGITATION 10/23/2006  . Breast cancer ([place])   . Colon polyp 2009    TUBULAR ADENOMA  . Diverticulosis of colon (without mention of hemorrhage) 2009  . Barrett's esophagus   . Arthritis   . Status post dilation of esophageal narrowing    Past Surgical History  Procedure Laterality Date  . Abdominal hysterectomy    . Cataract extraction Bilateral   . Bcc-face    . Mastectomy Bilateral     reports that she has never smoked. She has never used smokeless tobacco. She reports that she drinks about 2.5 oz of alcohol per week. She reports that she does not use illicit drugs. family history includes Breast cancer in her other; Colon cancer in her father and mother; Colon polyps in her son; Hypertension in her mother. Allergies  Allergen Reactions  . Aspirin     REACTION: nervousness---tolerates ibuprofen  . Atorvastatin Other (See Comments)    myalgia      Review of Systems  Constitutional: Negative for fatigue.  Eyes: Negative for visual disturbance.  Respiratory: Negative for cough, chest tightness, shortness of breath and wheezing.   Cardiovascular: Negative for  chest pain, palpitations and leg swelling.  Neurological: Negative for dizziness, seizures, syncope, weakness, light-headedness and headaches.       Objective:   Physical Exam  Constitutional: She appears well-developed and well-nourished. No distress.  Neck: Neck supple.  Cardiovascular: Normal rate and regular rhythm.   Pulmonary/Chest: Effort normal and breath sounds normal. No respiratory distress. She has no wheezes. She has no rales.  Musculoskeletal: She exhibits no edema.          Assessment & Plan:   hypertension. Probable white coat syndrome. She's had consistently well controlled readings at home. She did have mild hyperkalemia and we reduced her Aldactone. Recheck basic metabolic panel today. If potassium remains high we'll discontinue Aldactone and consider amlodipine versus HCTZ. Routine follow-up 3 months

## 2015-02-09 NOTE — Progress Notes (Signed)
Pre visit review using our clinic review tool, if applicable. No additional management support is needed unless otherwise documented below in the visit note. 

## 2015-02-09 NOTE — Patient Instructions (Signed)
Monitor blood pressure and be in touch if consistently > 150/90 

## 2015-02-28 ENCOUNTER — Ambulatory Visit (INDEPENDENT_AMBULATORY_CARE_PROVIDER_SITE_OTHER): Payer: Medicare Other | Admitting: General Practice

## 2015-02-28 DIAGNOSIS — Z5181 Encounter for therapeutic drug level monitoring: Secondary | ICD-10-CM

## 2015-02-28 DIAGNOSIS — I4891 Unspecified atrial fibrillation: Secondary | ICD-10-CM | POA: Diagnosis not present

## 2015-02-28 LAB — POCT INR: INR: 2.4

## 2015-02-28 NOTE — Progress Notes (Signed)
Pre visit review using our clinic review tool, if applicable. No additional management support is needed unless otherwise documented below in the visit note. 

## 2015-03-03 ENCOUNTER — Ambulatory Visit: Payer: Medicare Other | Admitting: Family Medicine

## 2015-03-08 ENCOUNTER — Telehealth: Payer: Self-pay

## 2015-03-08 MED ORDER — FENOFIBRATE 160 MG PO TABS: ORAL_TABLET | ORAL | Status: DC

## 2015-03-08 MED ORDER — PRAVASTATIN SODIUM 40 MG PO TABS: ORAL_TABLET | ORAL | Status: DC

## 2015-03-08 MED ORDER — WARFARIN SODIUM 1 MG PO TABS: ORAL_TABLET | ORAL | Status: DC

## 2015-03-08 NOTE — Telephone Encounter (Signed)
Medications refilled and sent to pharmacy.

## 2015-03-08 NOTE — Telephone Encounter (Signed)
Refill with one additional refill. 

## 2015-03-08 NOTE — Telephone Encounter (Signed)
Please advise patient is requesting refill on alprazolam

## 2015-03-11 ENCOUNTER — Other Ambulatory Visit: Payer: Self-pay

## 2015-03-11 NOTE — Telephone Encounter (Signed)
Patient has requested a refill on Alprazolam - ok to refill?

## 2015-03-15 MED ORDER — ALPRAZOLAM 0.5 MG PO TABS: 0.5000 mg | ORAL_TABLET | Freq: Every evening | ORAL | Status: DC | PRN

## 2015-03-15 NOTE — Telephone Encounter (Signed)
Called in.

## 2015-03-15 NOTE — Telephone Encounter (Signed)
Refill once 

## 2015-04-11 ENCOUNTER — Ambulatory Visit (INDEPENDENT_AMBULATORY_CARE_PROVIDER_SITE_OTHER): Payer: Medicare Other | Admitting: General Practice

## 2015-04-11 DIAGNOSIS — Z5181 Encounter for therapeutic drug level monitoring: Secondary | ICD-10-CM

## 2015-04-11 DIAGNOSIS — I4891 Unspecified atrial fibrillation: Secondary | ICD-10-CM

## 2015-04-11 LAB — POCT INR: INR: 1.8

## 2015-04-11 NOTE — Progress Notes (Signed)
Pre visit review using our clinic review tool, if applicable. No additional management support is needed unless otherwise documented below in the visit note. 

## 2015-05-09 ENCOUNTER — Ambulatory Visit (INDEPENDENT_AMBULATORY_CARE_PROVIDER_SITE_OTHER): Payer: Medicare Other | Admitting: General Practice

## 2015-05-09 ENCOUNTER — Ambulatory Visit (INDEPENDENT_AMBULATORY_CARE_PROVIDER_SITE_OTHER): Payer: Medicare Other | Admitting: Family Medicine

## 2015-05-09 VITALS — BP 160/90 | HR 79 | Temp 97.9°F | Ht 62.0 in | Wt 132.7 lb

## 2015-05-09 DIAGNOSIS — Z8639 Personal history of other endocrine, nutritional and metabolic disease: Secondary | ICD-10-CM

## 2015-05-09 DIAGNOSIS — I4891 Unspecified atrial fibrillation: Secondary | ICD-10-CM

## 2015-05-09 DIAGNOSIS — Z5181 Encounter for therapeutic drug level monitoring: Secondary | ICD-10-CM | POA: Diagnosis not present

## 2015-05-09 DIAGNOSIS — I10 Essential (primary) hypertension: Secondary | ICD-10-CM

## 2015-05-09 LAB — BASIC METABOLIC PANEL
BUN: 21 mg/dL (ref 6–23)
CO2: 29 mEq/L (ref 19–32)
Calcium: 10.6 mg/dL — ABNORMAL HIGH (ref 8.4–10.5)
Chloride: 100 mEq/L (ref 96–112)
Creatinine, Ser: 1.07 mg/dL (ref 0.40–1.20)
GFR: 51.58 mL/min — ABNORMAL LOW (ref >60.00)
Glucose, Bld: 109 mg/dL — ABNORMAL HIGH (ref 70–99)
Potassium: 4.4 mEq/L (ref 3.5–5.1)
Sodium: 139 mEq/L (ref 135–145)

## 2015-05-09 LAB — HEPATIC FUNCTION PANEL
ALT: 22 U/L (ref 0–35)
AST: 28 U/L (ref 0–37)
Albumin: 4.5 g/dL (ref 3.5–5.2)
Alkaline Phosphatase: 41 U/L (ref 39–117)
Bilirubin, Direct: 0.3 mg/dL (ref 0.0–0.3)
Total Bilirubin: 1 mg/dL (ref 0.2–1.2)
Total Protein: 7.4 g/dL (ref 6.0–8.3)

## 2015-05-09 LAB — POCT INR: INR: 2.1

## 2015-05-09 NOTE — Progress Notes (Signed)
   Subjective:    Patient ID: Deanna [name], female    DOB: [birth year], 82 y.o.   MRN: [id number]  HPI  follow-up hypertension. Refer to last note.  Consistently good controlled readings at home. Remains on lisinopril and Aldactone.  Recent mildly elevated calcium 11.1 which was not corrected as we had no albumin.  Calcium has increased slightly over the past couple of readings.  No headaches. No dizziness. No chest pains.  History of atrial fibrillation. Remains on Coumadin and due for INR today. No recent bleeding complications reported.  Past Medical History  Diagnosis Date  . Anxiety   . GERD (gastroesophageal reflux disease)   . Hyperlipidemia   . Hypertension   . Atrial fibrillation ([place])   . TRICUSPID REGURGITATION 10/23/2006  . Breast cancer ([place])   . Colon polyp 2009    TUBULAR ADENOMA  . Diverticulosis of colon (without mention of hemorrhage) 2009  . Barrett's esophagus   . Arthritis   . Status post dilation of esophageal narrowing    Past Surgical History  Procedure Laterality Date  . Abdominal hysterectomy    . Cataract extraction Bilateral   . Bcc-face    . Mastectomy Bilateral     reports that she has never smoked. She has never used smokeless tobacco. She reports that she drinks about 2.5 oz of alcohol per week. She reports that she does not use illicit drugs. family history includes Breast cancer in her other; Colon cancer in her father and mother; Colon polyps in her son; Hypertension in her mother. Allergies  Allergen Reactions  . Aspirin     REACTION: nervousness---tolerates ibuprofen  . Atorvastatin Other (See Comments)    myalgia      Review of Systems  Constitutional: Negative for appetite change and unexpected weight change.  Eyes: Negative for visual disturbance.  Respiratory: Negative for cough, chest tightness, shortness of breath and wheezing.   Cardiovascular: Negative for chest pain, palpitations and leg swelling.  Endocrine: Negative  for polydipsia and polyuria.  Genitourinary: Negative for dysuria.  Neurological: Negative for dizziness, seizures, syncope, weakness, light-headedness and headaches.  Hematological: Does not bruise/bleed easily.  Psychiatric/Behavioral: Negative for confusion.       Objective:   Physical Exam  Constitutional: She is oriented to person, place, and time. She appears well-developed and well-nourished.  Neck: Neck supple. No JVD present.  Cardiovascular: Normal rate and regular rhythm.   Pulmonary/Chest: Effort normal and breath sounds normal. No respiratory distress. She has no wheezes. She has no rales.  Musculoskeletal: She exhibits no edema.  Neurological: She is alert and oriented to person, place, and time.          Assessment & Plan:   #1 hypertension. Consistently has gotten readings less than 0000000 systolic at home. She is slightly up today. Probable white coat syndrome. Continue close monitoring and be in touch if systolic consistently greater than 150   #2 history of mild hyperkalemia. Recheck basic metabolic panel. Discontinue Aldactone if elevated  # 3 recent mild hypercalcemia. Recheck basic metabolic panel and albumin for correction. If elevated, will add PTH levels   #4 atrial fibrillation. On Coumadin. Recheck INR  Eulas Post MD [facility]

## 2015-05-09 NOTE — Patient Instructions (Signed)
Monitor blood pressure and be in touch if consistently > 150/90 

## 2015-05-09 NOTE — Progress Notes (Signed)
Pre visit review using our clinic review tool, if applicable. No additional management support is needed unless otherwise documented below in the visit note. 

## 2015-05-24 ENCOUNTER — Other Ambulatory Visit: Payer: Self-pay | Admitting: Family Medicine

## 2015-06-13 ENCOUNTER — Other Ambulatory Visit: Payer: Self-pay | Admitting: *Deleted

## 2015-06-13 MED ORDER — LISINOPRIL 20 MG PO TABS: ORAL_TABLET | ORAL | Status: DC

## 2015-06-16 ENCOUNTER — Telehealth: Payer: Self-pay | Admitting: General Practice

## 2015-06-17 MED ORDER — ALPRAZOLAM 0.5 MG PO TABS: 0.5000 mg | ORAL_TABLET | Freq: Every evening | ORAL | Status: DC | PRN

## 2015-06-17 NOTE — Telephone Encounter (Signed)
Refill once 

## 2015-06-17 NOTE — Telephone Encounter (Signed)
Rx printed and ready for MD signature

## 2015-06-17 NOTE — Telephone Encounter (Signed)
Faxed to pharmacy

## 2015-06-20 ENCOUNTER — Telehealth: Payer: Self-pay | Admitting: Family Medicine

## 2015-06-20 ENCOUNTER — Other Ambulatory Visit: Payer: Self-pay | Admitting: General Practice

## 2015-06-20 ENCOUNTER — Ambulatory Visit (INDEPENDENT_AMBULATORY_CARE_PROVIDER_SITE_OTHER): Payer: Medicare Other | Admitting: General Practice

## 2015-06-20 DIAGNOSIS — I4891 Unspecified atrial fibrillation: Secondary | ICD-10-CM | POA: Diagnosis not present

## 2015-06-20 DIAGNOSIS — Z5181 Encounter for therapeutic drug level monitoring: Secondary | ICD-10-CM | POA: Diagnosis not present

## 2015-06-20 LAB — POCT INR: INR: 1.9

## 2015-06-20 MED ORDER — ALLOPURINOL 300 MG PO TABS: ORAL_TABLET | ORAL | Status: DC

## 2015-06-20 MED ORDER — WARFARIN SODIUM 1 MG PO TABS: ORAL_TABLET | ORAL | Status: DC

## 2015-06-20 NOTE — Telephone Encounter (Signed)
Pt need a refill on Allopurinon Tab 300mg  sent to [facility]

## 2015-06-20 NOTE — Telephone Encounter (Signed)
Medication sent in for patient. 

## 2015-06-20 NOTE — Progress Notes (Signed)
Pre visit review using our clinic review tool, if applicable. No additional management support is needed unless otherwise documented below in the visit note. 

## 2015-08-01 ENCOUNTER — Telehealth: Payer: Self-pay | Admitting: Family Medicine

## 2015-08-01 ENCOUNTER — Ambulatory Visit (INDEPENDENT_AMBULATORY_CARE_PROVIDER_SITE_OTHER): Payer: Medicare Other | Admitting: General Practice

## 2015-08-01 DIAGNOSIS — I4891 Unspecified atrial fibrillation: Secondary | ICD-10-CM

## 2015-08-01 DIAGNOSIS — Z5181 Encounter for therapeutic drug level monitoring: Secondary | ICD-10-CM | POA: Diagnosis not present

## 2015-08-01 LAB — POCT INR: INR: 2.8

## 2015-08-01 MED ORDER — TRAMADOL HCL 50 MG PO TABS: 50.0000 mg | ORAL_TABLET | Freq: Four times a day (QID) | ORAL | 0 refills | Status: DC | PRN

## 2015-08-01 MED ORDER — OMEPRAZOLE 20 MG PO CPDR: 20.0000 mg | DELAYED_RELEASE_CAPSULE | Freq: Every day | ORAL | 3 refills | Status: DC

## 2015-08-01 NOTE — Telephone Encounter (Signed)
Refill OK

## 2015-08-01 NOTE — Telephone Encounter (Signed)
Last seen on 05/09/2015 Last refill on tramadol was 05/12/2014 #60 Please advise

## 2015-08-01 NOTE — Telephone Encounter (Signed)
traMADol (ULTRAM) 50 MG tablet sent to see if we can send this Rx to: [place], [place] [phone] (Phone) [phone] (Fax)   If not she wants them sent to: [id number] - [place], [place]. [address] [phone] (Phone) [phone] (Fax)        omeprazole (Melvina) 20 MG capsule sent to: [place], [place] [phone] (Phone) [phone] (Fax)

## 2015-08-01 NOTE — Telephone Encounter (Signed)
Medication sent in for patient. 

## 2015-09-01 ENCOUNTER — Other Ambulatory Visit: Payer: Self-pay | Admitting: Family Medicine

## 2015-09-02 ENCOUNTER — Other Ambulatory Visit: Payer: Self-pay | Admitting: *Deleted

## 2015-09-02 MED ORDER — ALPRAZOLAM 0.5 MG PO TABS
0.5000 mg | ORAL_TABLET | Freq: Every evening | ORAL | 0 refills | Status: DC | PRN
Start: 2015-09-02 — End: 2018-06-09

## 2015-09-07 LAB — HM DIABETES FOOT EXAM: HM Diabetic Foot Exam: NORMAL

## 2015-09-19 ENCOUNTER — Ambulatory Visit (INDEPENDENT_AMBULATORY_CARE_PROVIDER_SITE_OTHER): Payer: Medicare Other | Admitting: General Practice

## 2015-09-19 ENCOUNTER — Ambulatory Visit: Payer: Medicare Other | Admitting: General Practice

## 2015-09-19 ENCOUNTER — Other Ambulatory Visit: Payer: Self-pay | Admitting: General Practice

## 2015-09-19 DIAGNOSIS — Z5181 Encounter for therapeutic drug level monitoring: Secondary | ICD-10-CM | POA: Diagnosis not present

## 2015-09-19 DIAGNOSIS — I4891 Unspecified atrial fibrillation: Secondary | ICD-10-CM

## 2015-09-19 LAB — POCT INR: INR: 2.3

## 2015-09-19 MED ORDER — WARFARIN SODIUM 1 MG PO TABS: ORAL_TABLET | ORAL | 1 refills | Status: DC

## 2015-09-20 ENCOUNTER — Observation Stay (HOSPITAL_COMMUNITY)
Admission: EM | Admit: 2015-09-20 | Discharge: 2015-09-22 | Disposition: A | Discharge status: Home / Self Care | Payer: Medicare Other | Attending: Internal Medicine | Admitting: Internal Medicine

## 2015-09-20 ENCOUNTER — Encounter (HOSPITAL_COMMUNITY): Payer: Self-pay

## 2015-09-20 ENCOUNTER — Emergency Department (HOSPITAL_COMMUNITY): Payer: Medicare Other

## 2015-09-20 DIAGNOSIS — R079 Chest pain, unspecified: Secondary | ICD-10-CM | POA: Diagnosis not present

## 2015-09-20 DIAGNOSIS — N179 Acute kidney failure, unspecified: Secondary | ICD-10-CM | POA: Insufficient documentation

## 2015-09-20 DIAGNOSIS — I482 Chronic atrial fibrillation: Secondary | ICD-10-CM | POA: Diagnosis not present

## 2015-09-20 DIAGNOSIS — I4891 Unspecified atrial fibrillation: Secondary | ICD-10-CM | POA: Diagnosis present

## 2015-09-20 DIAGNOSIS — Z7901 Long term (current) use of anticoagulants: Secondary | ICD-10-CM | POA: Insufficient documentation

## 2015-09-20 DIAGNOSIS — E785 Hyperlipidemia, unspecified: Secondary | ICD-10-CM | POA: Insufficient documentation

## 2015-09-20 DIAGNOSIS — M1990 Unspecified osteoarthritis, unspecified site: Secondary | ICD-10-CM | POA: Diagnosis not present

## 2015-09-20 DIAGNOSIS — R0789 Other chest pain: Secondary | ICD-10-CM | POA: Diagnosis not present

## 2015-09-20 DIAGNOSIS — Z853 Personal history of malignant neoplasm of breast: Secondary | ICD-10-CM | POA: Insufficient documentation

## 2015-09-20 DIAGNOSIS — I129 Hypertensive chronic kidney disease with stage 1 through stage 4 chronic kidney disease, or unspecified chronic kidney disease: Secondary | ICD-10-CM | POA: Diagnosis not present

## 2015-09-20 DIAGNOSIS — N189 Chronic kidney disease, unspecified: Secondary | ICD-10-CM | POA: Diagnosis not present

## 2015-09-20 DIAGNOSIS — Z9013 Acquired absence of bilateral breasts and nipples: Secondary | ICD-10-CM | POA: Insufficient documentation

## 2015-09-20 DIAGNOSIS — I160 Hypertensive urgency: Secondary | ICD-10-CM | POA: Diagnosis present

## 2015-09-20 DIAGNOSIS — R072 Precordial pain: Secondary | ICD-10-CM | POA: Diagnosis not present

## 2015-09-20 DIAGNOSIS — K219 Gastro-esophageal reflux disease without esophagitis: Secondary | ICD-10-CM | POA: Diagnosis not present

## 2015-09-20 LAB — BASIC METABOLIC PANEL
Anion gap: 13 (ref 5–15)
BUN: 21 mg/dL — ABNORMAL HIGH (ref 6–20)
CO2: 23 mmol/L (ref 22–32)
Calcium: 10 mg/dL (ref 8.9–10.3)
Chloride: 104 mmol/L (ref 101–111)
Creatinine, Ser: 1.58 mg/dL — ABNORMAL HIGH (ref 0.44–1.00)
GFR calc Af Amer: 33 mL/min — ABNORMAL LOW (ref >60)
GFR calc non Af Amer: 28 mL/min — ABNORMAL LOW (ref >60)
Glucose, Bld: 105 mg/dL — ABNORMAL HIGH (ref 65–99)
Potassium: 4.6 mmol/L (ref 3.5–5.1)
Sodium: 140 mmol/L (ref 135–145)

## 2015-09-20 LAB — I-STAT TROPONIN, ED: Troponin i, poc: 0.03 ng/mL (ref 0.00–0.08)

## 2015-09-20 LAB — CBC
HCT: 46.7 % — ABNORMAL HIGH (ref 36.0–46.0)
Hemoglobin: 15.1 g/dL — ABNORMAL HIGH (ref 12.0–15.0)
MCH: 32 pg (ref 26.0–34.0)
MCHC: 32.3 g/dL (ref 30.0–36.0)
MCV: 98.9 fL (ref 78.0–100.0)
Platelets: 261 10*3/uL (ref 150–400)
RBC: 4.72 MIL/uL (ref 3.87–5.11)
RDW: 14.8 % (ref 11.5–15.5)
WBC: 7.9 10*3/uL (ref 4.0–10.5)

## 2015-09-20 LAB — PROTIME-INR
INR: 2.24
Prothrombin Time: 25.1 seconds — ABNORMAL HIGH (ref 11.4–15.2)

## 2015-09-20 MED ORDER — ASPIRIN 81 MG PO CHEW
324.0000 mg | CHEWABLE_TABLET | Freq: Once | ORAL | Status: DC
  Filled 2015-09-20: qty 4

## 2015-09-20 MED ORDER — SODIUM CHLORIDE 0.9 % IV BOLUS (SEPSIS)
500.0000 mL | Freq: Once | INTRAVENOUS | Status: AC
  Administered 2015-09-20: 500 mL via INTRAVENOUS

## 2015-09-20 NOTE — ED Triage Notes (Signed)
Pt complaining of R sided chest pain that radiates to L arm and jaw since this afternoon. Pt complaining of light headed and dizziness.

## 2015-09-20 NOTE — ED Notes (Signed)
Pt denies chest pain at this time.

## 2015-09-20 NOTE — ED Provider Notes (Signed)
[facility] DEPT Provider Note   CSN: [id number] Arrival date & time: 09/20/15  1703     History   Chief Complaint Chief Complaint  Patient presents with  . Chest Pain    HPI Deanna [name] is a 83 y.o. female.  HPI  Pt presenting with c/o chest pain.  She states the pain was on right side of her chest/midsternal with some radiation down left arm as well.  She states she had one episode of this, but then when the second similar episode occurred she became worried and came to the ED for evaluation.  No shortness of breath.  No nausea, no diaphoresis.  No fever/chills, no cough or recent illness.  No leg swelling.  Pt has a hx of afib and takes coumadin for this.  She currently has no active chest pain.  There are no other associated systemic symptoms, there are no other alleviating or modifying factors.   Past Medical History:  Diagnosis Date  . Anxiety   . Arthritis   . Atrial fibrillation ([place])   . Barrett's esophagus   . Breast cancer ([place])   . Colon polyp 2009   TUBULAR ADENOMA  . Diverticulosis of colon (without mention of hemorrhage) 2009  . GERD (gastroesophageal reflux disease)   . Hyperlipidemia   . Hypertension   . Status post dilation of esophageal narrowing   . TRICUSPID REGURGITATION 10/23/2006    Patient Active Problem List   Diagnosis Date Noted  . Hypertensive urgency 09/21/2015  . Chest pain 09/20/2015  . Hypercalcemia 05/09/2015  . Encounter for therapeutic drug monitoring 02/02/2013  . Personal history of colonic polyps 04/07/2012  . Barrett's esophagus 04/07/2012  . Osteoarthritis 02/01/2012  . URI (upper respiratory infection) 01/16/2012  . Gout 12/24/2007  . TRICUSPID REGURGITATION 10/23/2006  . ATRIAL FIBRILLATION 10/15/2006  . HYPERGLYCEMIA 10/15/2006  . Hyperlipidemia 09/23/2006  . ANXIETY 09/23/2006  . Essential hypertension 09/23/2006  . GERD 09/23/2006    Past Surgical History:  Procedure Laterality Date  . ABDOMINAL  HYSTERECTOMY    . bcc-face    . CATARACT EXTRACTION Bilateral   . MASTECTOMY Bilateral     OB History    No data available       Home Medications    Prior to Admission medications   Medication Sig Start Date End Date Taking? Authorizing Provider  acetaminophen (TYLENOL) 325 MG tablet Take 325 mg by mouth every 6 (six) hours as needed for moderate pain.    Yes Historical Provider, MD  allopurinol (ZYLOPRIM) 300 MG tablet Take 1 tablet by mouth  daily 06/20/15  Yes Eulas Post, MD  ALPRAZolam Duanne Moron) 0.5 MG tablet Take 1 tablet (0.5 mg total) by mouth at bedtime as needed for anxiety. Patient taking differently: Take 0.5 mg by mouth at bedtime.  09/02/15  Yes Eulas Post, MD  Carboxymethylcellul-Glycerin (OPTIVE) 0.5-0.9 % SOLN Place 1 drop into both eyes daily.    Yes Historical Provider, MD  DIGOX 125 MCG tablet TAKE 1 TABLET BY MOUTH  DAILY 05/24/15  Yes Eulas Post, MD  fenofibrate 160 MG tablet Take 1 tablet by mouth  daily Patient taking differently: Take 1 tablet by mouth daily in evening 09/02/15  Yes Eulas Post, MD  Multiple Vitamin (MULTIVITAMIN) tablet Take 1 tablet by mouth daily.     Yes Historical Provider, MD  omeprazole (PRILOSEC) 20 MG capsule Take 1 capsule (20 mg total) by mouth daily. 08/01/15  Yes Eulas Post, MD  pravastatin (PRAVACHOL) 40 MG tablet Take 1 tablet by mouth  daily Patient taking differently: Take 1 tablet by mouth daily in evening 09/02/15  Yes Eulas Post, MD  spironolactone (ALDACTONE) 50 MG tablet Take 1 tablet by mouth  daily 05/24/15  Yes Eulas Post, MD  traMADol (ULTRAM) 50 MG tablet Take 1 tablet (50 mg total) by mouth every 6 (six) hours as needed. for pain 08/01/15  Yes Eulas Post, MD  warfarin (COUMADIN) 1 MG tablet Take as directed by  Anticoagulation Clinic Patient taking differently: Take 1-2 mg by mouth every morning. Takes 2mg  on Wed and Sat takes 1mg  all other days 09/19/15  Yes Eulas Post, MD  amLODipine (NORVASC) 5 MG tablet Take 1 tablet (5 mg total) by mouth daily. 09/23/15   Belkys A Regalado, MD  hydrALAZINE (APRESOLINE) 25 MG tablet Take 1 tablet (25 mg total) by mouth every 8 (eight) hours. 09/22/15   Elmarie Shiley, MD    Family History Family History  Problem Relation Age of Onset  . Colon cancer Mother   . Hypertension Mother   . Colon cancer Father   . Breast cancer Other   . Colon polyps Son     Social History Social History  Substance Use Topics  . Smoking status: Never Smoker  . Smokeless tobacco: Never Used  . Alcohol use 2.5 oz/week    5 drink(s) per week     Allergies   Aspirin and Atorvastatin   Review of Systems Review of Systems  ROS reviewed and all otherwise negative except for mentioned in HPI   Physical Exam Updated Vital Signs BP (!) 144/68 (BP Location: Left Arm)   Pulse 60   Temp 98 F (36.7 C) (Oral)   Resp 18   Ht 5\' 3"  (1.6 m)   Wt 61.1 kg   SpO2 95%   BMI 23.86 kg/m  Vitals reviewed Physical Exam Physical Examination: General appearance - alert, well appearing, and in no distress Mental status - alert, oriented to person, place, and time Eyes -no conjunctival injection, no scleral icterus Chest - clear to auscultation, no wheezes, rales or rhonchi, symmetric air entry Heart - normal rate, regular rhythm, normal S1, S2, no murmurs, rubs, clicks or gallops Abdomen - soft, nontender, nondistended, no masses or organomegaly Neurological - alert, oriented, normal speech,  Extremities - peripheral pulses normal, no pedal edema, no clubbing or cyanosis Skin - normal coloration and turgor, no rashes  ED Treatments / Results  Labs (all labs ordered are listed, but only abnormal results are displayed) Labs Reviewed  BASIC METABOLIC PANEL - Abnormal; Notable for the following:       Result Value   Glucose, Bld 105 (*)    BUN 21 (*)    Creatinine, Ser 1.58 (*)    GFR calc non Af Amer 28 (*)    GFR calc  Af Amer 33 (*)    All other components within normal limits  CBC - Abnormal; Notable for the following:    Hemoglobin 15.1 (*)    HCT 46.7 (*)    All other components within normal limits  PROTIME-INR - Abnormal; Notable for the following:    Prothrombin Time 25.1 (*)    All other components within normal limits  TROPONIN I - Abnormal; Notable for the following:    Troponin I 0.05 (*)    All other components within normal limits  PROTIME-INR - Abnormal; Notable for the following:  Prothrombin Time 25.2 (*)    All other components within normal limits  BASIC METABOLIC PANEL - Abnormal; Notable for the following:    Glucose, Bld 104 (*)    Creatinine, Ser 1.04 (*)    GFR calc non Af Amer 47 (*)    GFR calc Af Amer 54 (*)    All other components within normal limits  TROPONIN I - Abnormal; Notable for the following:    Troponin I 0.05 (*)    All other components within normal limits  TROPONIN I - Abnormal; Notable for the following:    Troponin I 0.05 (*)    All other components within normal limits  TROPONIN I - Abnormal; Notable for the following:    Troponin I 0.05 (*)    All other components within normal limits  PROTIME-INR - Abnormal; Notable for the following:    Prothrombin Time 23.2 (*)    All other components within normal limits  BASIC METABOLIC PANEL - Abnormal; Notable for the following:    Glucose, Bld 104 (*)    Creatinine, Ser 1.07 (*)    GFR calc non Af Amer 45 (*)    GFR calc Af Amer 53 (*)    All other components within normal limits  DIGOXIN LEVEL  CBC  I-STAT TROPOININ, ED    EKG  EKG Interpretation  Date/Time:  Tuesday September 20 2015 17:07:37 EDT Ventricular Rate:  83 PR Interval:    QRS Duration: 84 QT Interval:  380 QTC Calculation: 446 R Axis:   65 Text Interpretation:  Atrial fibrillation ST & T wave abnormality, consider inferior ischemia Abnormal ECG No significant change since last tracing Confirmed by Canary Brim  MD, MARTHA [phone]) on  09/20/2015 9:15:39 PM       Radiology No results found.  Procedures Procedures (including critical care time)  Medications Ordered in ED Medications  sodium chloride 0.9 % bolus 500 mL (0 mLs Intravenous Stopped 09/20/15 2251)  regadenoson (LEXISCAN) injection SOLN 0.4 mg (0.4 mg Intravenous Given 09/21/15 1343)  regadenoson (LEXISCAN) 0.4 MG/5ML injection SOLN (  Duplicate [id number])  warfarin (COUMADIN) tablet 2 mg (2 mg Oral Given 09/21/15 1826)     Initial Impression / Assessment and Plan / ED Course  I have reviewed the triage vital signs and the nursing notes.  Pertinent labs & imaging results that were available during my care of the patient were reviewed by me and considered in my medical decision making (see chart for details).  Clinical Course   10:34 PM d/w Dr. Hal Hope, hospitalist, he recommends telemetry obs.    Pt presenting with c/o chest pain and left armpain/heaviness.  Pt has a heart score of 4.  She has declined to take aspirin in the ED.  Troponin negative.  No significant acute findings on EKG.  She has hx of afib and INR is therapeutic. No chest pain in the ED.  D/w hospitalist given her risk factors she will need admission for further cardiac workup.  Per chart reivew and per patient she has not had prior stress/cath or other workup in the past.    Final Clinical Impressions(s) / ED Diagnoses   Final diagnoses:  Chest pain    New Prescriptions Discharge Medication List as of 09/22/2015  1:18 PM    START taking these medications   Details  amLODipine (NORVASC) 5 MG tablet Take 1 tablet (5 mg total) by mouth daily., Starting Fri 09/23/2015, Print    hydrALAZINE (APRESOLINE) 25 MG tablet Take  1 tablet (25 mg total) by mouth every 8 (eight) hours., Starting Thu 09/22/2015, Print         Alfonzo Beers, MD 09/24/15 [phone]

## 2015-09-21 ENCOUNTER — Encounter (HOSPITAL_COMMUNITY): Payer: Self-pay | Admitting: Internal Medicine

## 2015-09-21 ENCOUNTER — Observation Stay (HOSPITAL_COMMUNITY): Payer: Medicare Other

## 2015-09-21 ENCOUNTER — Observation Stay (HOSPITAL_BASED_OUTPATIENT_CLINIC_OR_DEPARTMENT_OTHER): Payer: Medicare Other

## 2015-09-21 DIAGNOSIS — E785 Hyperlipidemia, unspecified: Secondary | ICD-10-CM | POA: Diagnosis not present

## 2015-09-21 DIAGNOSIS — I209 Angina pectoris, unspecified: Secondary | ICD-10-CM | POA: Diagnosis not present

## 2015-09-21 DIAGNOSIS — I160 Hypertensive urgency: Secondary | ICD-10-CM | POA: Diagnosis not present

## 2015-09-21 DIAGNOSIS — I482 Chronic atrial fibrillation: Secondary | ICD-10-CM

## 2015-09-21 DIAGNOSIS — R079 Chest pain, unspecified: Secondary | ICD-10-CM

## 2015-09-21 DIAGNOSIS — I4891 Unspecified atrial fibrillation: Secondary | ICD-10-CM

## 2015-09-21 DIAGNOSIS — R0789 Other chest pain: Secondary | ICD-10-CM | POA: Diagnosis not present

## 2015-09-21 LAB — NM MYOCAR MULTI W/SPECT W/WALL MOTION / EF
Exercise duration (min): 4 min
Exercise duration (sec): 0 s
LV dias vol: 58 mL (ref 46–106)
LV sys vol: 12 mL
RATE: 0
Rest HR: 76 {beats}/min
SDS: 1
SRS: 0
SSS: 1
TID: 1.22

## 2015-09-21 LAB — TROPONIN I
Troponin I: 0.05 ng/mL (ref <0.03)
Troponin I: 0.05 ng/mL (ref <0.03)
Troponin I: 0.05 ng/mL (ref <0.03)
Troponin I: 0.05 ng/mL (ref <0.03)

## 2015-09-21 LAB — BASIC METABOLIC PANEL
Anion gap: 9 (ref 5–15)
BUN: 14 mg/dL (ref 6–20)
CO2: 27 mmol/L (ref 22–32)
Calcium: 10.2 mg/dL (ref 8.9–10.3)
Chloride: 104 mmol/L (ref 101–111)
Creatinine, Ser: 1.04 mg/dL — ABNORMAL HIGH (ref 0.44–1.00)
GFR calc Af Amer: 54 mL/min — ABNORMAL LOW (ref >60)
GFR calc non Af Amer: 47 mL/min — ABNORMAL LOW (ref >60)
Glucose, Bld: 104 mg/dL — ABNORMAL HIGH (ref 65–99)
Potassium: 4.2 mmol/L (ref 3.5–5.1)
Sodium: 140 mmol/L (ref 135–145)

## 2015-09-21 LAB — PROTIME-INR
INR: 2.25
Prothrombin Time: 25.2 seconds — ABNORMAL HIGH (ref 11.4–15.2)

## 2015-09-21 LAB — DIGOXIN LEVEL: Digoxin Level: 1 ng/mL (ref 0.8–2.0)

## 2015-09-21 MED ORDER — REGADENOSON 0.4 MG/5ML IV SOLN
0.4000 mg | Freq: Once | INTRAVENOUS | Status: AC
  Administered 2015-09-21: 0.4 mg via INTRAVENOUS
  Filled 2015-09-21: qty 5

## 2015-09-21 MED ORDER — SPIRONOLACTONE 25 MG PO TABS
50.0000 mg | ORAL_TABLET | Freq: Every day | ORAL | Status: DC
  Administered 2015-09-21 – 2015-09-22 (×2): 50 mg via ORAL
  Filled 2015-09-21 (×2): qty 2

## 2015-09-21 MED ORDER — HYDRALAZINE HCL 20 MG/ML IJ SOLN
10.0000 mg | INTRAMUSCULAR | Status: DC | PRN
  Administered 2015-09-21: 10 mg via INTRAVENOUS
  Filled 2015-09-21: qty 1

## 2015-09-21 MED ORDER — DIGOXIN 125 MCG PO TABS
125.0000 ug | ORAL_TABLET | Freq: Every day | ORAL | Status: DC
  Administered 2015-09-21 – 2015-09-22 (×2): 125 ug via ORAL
  Filled 2015-09-21 (×2): qty 1

## 2015-09-21 MED ORDER — ALPRAZOLAM 0.5 MG PO TABS
0.5000 mg | ORAL_TABLET | Freq: Every day | ORAL | Status: DC
  Administered 2015-09-21 (×2): 0.5 mg via ORAL
  Filled 2015-09-21 (×2): qty 1

## 2015-09-21 MED ORDER — PRAVASTATIN SODIUM 40 MG PO TABS
40.0000 mg | ORAL_TABLET | Freq: Every day | ORAL | Status: DC
  Administered 2015-09-21 – 2015-09-22 (×2): 40 mg via ORAL
  Filled 2015-09-21 (×2): qty 1

## 2015-09-21 MED ORDER — SODIUM CHLORIDE 0.9 % IV SOLN
INTRAVENOUS | Status: DC
  Administered 2015-09-21: 12:00:00 via INTRAVENOUS

## 2015-09-21 MED ORDER — WARFARIN SODIUM 2 MG PO TABS
2.0000 mg | ORAL_TABLET | Freq: Once | ORAL | Status: AC
  Administered 2015-09-21: 2 mg via ORAL
  Filled 2015-09-21: qty 1

## 2015-09-21 MED ORDER — LISINOPRIL 10 MG PO TABS
20.0000 mg | ORAL_TABLET | Freq: Two times a day (BID) | ORAL | Status: DC
  Administered 2015-09-21: 20 mg via ORAL
  Filled 2015-09-21: qty 2

## 2015-09-21 MED ORDER — ADULT MULTIVITAMIN W/MINERALS CH
1.0000 | ORAL_TABLET | Freq: Every day | ORAL | Status: DC
  Administered 2015-09-21 – 2015-09-22 (×2): 1 via ORAL
  Filled 2015-09-21 (×2): qty 1

## 2015-09-21 MED ORDER — AMLODIPINE BESYLATE 5 MG PO TABS
5.0000 mg | ORAL_TABLET | Freq: Every day | ORAL | Status: DC
  Administered 2015-09-21 – 2015-09-22 (×2): 5 mg via ORAL
  Filled 2015-09-21 (×2): qty 1

## 2015-09-21 MED ORDER — REGADENOSON 0.4 MG/5ML IV SOLN
INTRAVENOUS | Status: AC
  Filled 2015-09-21: qty 5

## 2015-09-21 MED ORDER — HYDRALAZINE HCL 25 MG PO TABS
25.0000 mg | ORAL_TABLET | Freq: Three times a day (TID) | ORAL | Status: DC
  Administered 2015-09-21 – 2015-09-22 (×4): 25 mg via ORAL
  Filled 2015-09-21 (×4): qty 1

## 2015-09-21 MED ORDER — FENOFIBRATE 160 MG PO TABS
160.0000 mg | ORAL_TABLET | Freq: Every day | ORAL | Status: DC
  Administered 2015-09-21 – 2015-09-22 (×2): 160 mg via ORAL
  Filled 2015-09-21 (×2): qty 1

## 2015-09-21 MED ORDER — ALLOPURINOL 300 MG PO TABS
300.0000 mg | ORAL_TABLET | Freq: Every day | ORAL | Status: DC
Start: 2015-09-21 — End: 2015-09-22
  Administered 2015-09-21 – 2015-09-22 (×2): 300 mg via ORAL
  Filled 2015-09-21 (×2): qty 1

## 2015-09-21 MED ORDER — ACETAMINOPHEN 325 MG PO TABS
650.0000 mg | ORAL_TABLET | ORAL | Status: DC | PRN
Start: 2015-09-21 — End: 2015-09-22

## 2015-09-21 MED ORDER — PANTOPRAZOLE SODIUM 40 MG PO TBEC
40.0000 mg | DELAYED_RELEASE_TABLET | Freq: Every day | ORAL | Status: DC
  Administered 2015-09-21 – 2015-09-22 (×2): 40 mg via ORAL
  Filled 2015-09-21 (×2): qty 1

## 2015-09-21 MED ORDER — WARFARIN - PHARMACIST DOSING INPATIENT
Freq: Every day | Status: DC
  Administered 2015-09-21: 18:00:00

## 2015-09-21 MED ORDER — ONDANSETRON HCL 4 MG/2ML IJ SOLN: 4.0000 mg | Freq: Four times a day (QID) | INTRAMUSCULAR | Status: DC | PRN

## 2015-09-21 MED ORDER — TRAMADOL HCL 50 MG PO TABS: 50.0000 mg | ORAL_TABLET | Freq: Four times a day (QID) | ORAL | Status: DC | PRN

## 2015-09-21 NOTE — Progress Notes (Signed)
PROGRESS NOTE    Deanna [name]  HCW:[id number] DOB: 1926/01/18 DOA: 09/20/2015 PCP: Eulas Post, MD   Brief Narrative:  Deanna [name] is a 82 y.o. female with atrial fibrillation, hypertension, chronic kidney disease presents to the ER because of chest pain. Pain started having chest pain last afternoon while at home at rest which was retrosternal and unable to exactly describe the nature of the pain but was radiating to both arms. Lasted for 10 minutes and resolved. In the ER EKG was showing ST depression in the inferolateral leads. Chest x-ray and troponins were negative. Patient will be admitted for further management of chest pain. In addition patient's blood pressure was found to be markedly elevated. Patient states she has been compliant with her medications. Patient's INR is therapeutic.   ED Course: Cardiac markers were negative. EKG was showing ST depression in the inferolateral leads.   Assessment & Plan:   Principal Problem:   Chest pain Active Problems:   Hyperlipidemia   ATRIAL FIBRILLATION   Hypertensive urgency  Chest pain -Patient is on Coumadin which we will hold and keep on heparin if INR is less than 2. Continue statins. Mild elevation of troponin. Chest pain free.  Cardiology consulted.   Hypertension uncontrolled  Continue with PRN  IV hydralazine. Continue spironolactone.  Hold lisinopril, due to mild elevation cr.  Add norvasc.   Chronic kidney disease status 3-4 - closely follow creatinine. Any further worsening may have to hold spironolactone. Will hold Lisinopril.  IV fluids./  B-met pending for this morning.   Atrial fibrillation - on digoxin. Check digoxin levels. Patient is on Coumadin which will be held and placed on heparin anticipation of procedure. -Await cardiology evaluation.      DVT prophylaxis: heparin  Code Status: Full code.  Family Communication: daughter at bedside.  Disposition Plan: continue in the hospital for further  evaluation of chest pain, AKI.    Consultants:   Cardiology    Procedures:  ECHO pending.    Antimicrobials: none   Subjective: Chest pain free this morning.  Denies chest pain on exertion/  Yesterday had chest pain, left arm pain and numbness, near syncope.   Objective: Vitals:   09/20/15 2230 09/21/15 0015 09/21/15 0119 09/21/15 0500  BP: 189/85 (!) 192/84 (!) 159/66   Pulse: 66 79  64  Resp: 17 20  20   Temp:  98.5 F (36.9 C)  97.8 F (36.6 C)  TempSrc:  Oral  Oral  SpO2: 98% 99%  96%  Weight:  61.1 kg (134 lb 11.2 oz)    Height:  5' 3"  (1.6 m)     No intake or output data in the 24 hours ending 09/21/15 0933 Filed Weights   09/20/15 1712 09/21/15 0015  Weight: 59.9 kg (132 lb) 61.1 kg (134 lb 11.2 oz)    Examination:  General exam: Appears calm and comfortable  Respiratory system: Clear to auscultation. Respiratory effort normal. Cardiovascular system: S1 & S2 heard, RRR. No JVD, murmurs, rubs, gallops or clicks. No pedal edema. Gastrointestinal system: Abdomen is nondistended, soft and nontender. No organomegaly or masses felt. Normal bowel sounds heard. Central nervous system: Alert and oriented. No focal neurological deficits. Extremities: Symmetric 5 x 5 power. Skin: No rashes, lesions or ulcers Psychiatry: Judgement and insight appear normal. Mood & affect appropriate.     Data Reviewed: I have personally reviewed following labs and imaging studies  CBC:  Recent Labs Lab 09/20/15 1711  WBC 7.9  HGB 15.1*  HCT 46.7*  MCV 98.9  PLT 409   Basic Metabolic Panel:  Recent Labs Lab 09/20/15 1711  NA 140  K 4.6  CL 104  CO2 23  GLUCOSE 105*  BUN 21*  CREATININE 1.58*  CALCIUM 10.0   GFR: Estimated Creatinine Clearance: 20.8 mL/min (by C-G formula based on SCr of 1.58 mg/dL (H)). Liver Function Tests: No results for input(s): AST, ALT, ALKPHOS, BILITOT, PROT, ALBUMIN in the last 168 hours. No results for input(s): LIPASE, AMYLASE in  the last 168 hours. No results for input(s): AMMONIA in the last 168 hours. Coagulation Profile:  Recent Labs Lab 09/19/15 09/20/15 2038 09/21/15 0034  INR 2.3 2.24 2.25   Cardiac Enzymes:  Recent Labs Lab 09/21/15 0034  TROPONINI 0.05*   BNP (last 3 results) No results for input(s): PROBNP in the last 8760 hours. HbA1C: No results for input(s): HGBA1C in the last 72 hours. CBG: No results for input(s): GLUCAP in the last 168 hours. Lipid Profile: No results for input(s): CHOL, HDL, LDLCALC, TRIG, CHOLHDL, LDLDIRECT in the last 72 hours. Thyroid Function Tests: No results for input(s): TSH, T4TOTAL, FREET4, T3FREE, THYROIDAB in the last 72 hours. Anemia Panel: No results for input(s): VITAMINB12, FOLATE, FERRITIN, TIBC, IRON, RETICCTPCT in the last 72 hours. Sepsis Labs: No results for input(s): PROCALCITON, LATICACIDVEN in the last 168 hours.  No results found for this or any previous visit (from the past 240 hour(s)).       Radiology Studies: Dg Chest 2 View  Result Date: 09/20/2015 CLINICAL DATA:  Syncope, chest pain EXAM: CHEST  2 VIEW COMPARISON:  None. FINDINGS: Borderline cardiomegaly. No infiltrate or pulmonary edema. Launch hiatal hernia is noted. Surgical clips are noted in right axilla. Osteopenia and degenerative changes thoracic spine. IMPRESSION: No active disease. Large hiatal hernia is noted. Surgical clips are noted in right axilla. Electronically Signed   By: Lahoma Crocker M.D.   On: 09/20/2015 17:47        Scheduled Meds: . allopurinol  300 mg Oral Daily  . ALPRAZolam  0.5 mg Oral QHS  . aspirin  324 mg Oral Once  . digoxin  125 mcg Oral Daily  . fenofibrate  160 mg Oral Daily  . multivitamin with minerals  1 tablet Oral Daily  . pantoprazole  40 mg Oral Daily  . pravastatin  40 mg Oral Daily  . spironolactone  50 mg Oral Daily   Continuous Infusions:    LOS: 0 days    Time spent:35 minutes.     Elmarie Shiley, MD [facility] Pager [phone]  If 7PM-7AM, please contact night-coverage [email or web address] Password [facility] 09/21/2015, 9:33 AM

## 2015-09-21 NOTE — Progress Notes (Signed)
Myoview done-final report pending  Kerin Ransom PA-C 09/21/2015 1:46 PM

## 2015-09-21 NOTE — Progress Notes (Signed)
ANTICOAGULATION CONSULT NOTE - Initial Consult  Pharmacy Consult for warfarin  Indication: atrial fibrillation  Allergies  Allergen Reactions  . Aspirin Other (See Comments)    REACTION: nervousness---tolerates ibuprofen  . Atorvastatin Other (See Comments)    myalgia    Patient Measurements: Height: 5\' 3"  (160 cm) Weight: 134 lb 11.2 oz (61.1 kg) IBW/kg (Calculated) : 52.4  Vital Signs: Temp: 97.3 F (36.3 C) (09/13 1610) Temp Source: Oral (09/13 1610) BP: 186/84 (09/13 1610) Pulse Rate: 82 (09/13 1610)  Labs:  Recent Labs  09/19/15 09/20/15 1711 09/20/15 2038 09/21/15 0034 09/21/15 1132 09/21/15 1526  HGB  --  15.1*  --   --   --   --   HCT  --  46.7*  --   --   --   --   PLT  --  261  --   --   --   --   LABPROT  --   --  25.1* 25.2*  --   --   INR 2.3  --  2.24 2.25  --   --   CREATININE  --  1.58*  --   --  1.04*  --   TROPONINI  --   --   --  0.05* 0.05* 0.05*    Estimated Creatinine Clearance: 31.5 mL/min (by C-G formula based on SCr of 1.04 mg/dL (H)).   Medical History: Past Medical History:  Diagnosis Date  . Anxiety   . Arthritis   . Atrial fibrillation ([place])   . Barrett's esophagus   . Breast cancer ([place])   . Colon polyp 2009   TUBULAR ADENOMA  . Diverticulosis of colon (without mention of hemorrhage) 2009  . GERD (gastroesophageal reflux disease)   . Hyperlipidemia   . Hypertension   . Status post dilation of esophageal narrowing   . TRICUSPID REGURGITATION 10/23/2006    Medications:  Prescriptions Prior to Admission  Medication Sig Dispense Refill Last Dose  . acetaminophen (TYLENOL) 325 MG tablet Take 325 mg by mouth every 6 (six) hours as needed for moderate pain.    09/20/2015 at Unknown time  . allopurinol (ZYLOPRIM) 300 MG tablet Take 1 tablet by mouth  daily 90 tablet 2 09/20/2015 at Unknown time  . ALPRAZolam (XANAX) 0.5 MG tablet Take 1 tablet (0.5 mg total) by mouth at bedtime as needed for anxiety. (Patient taking differently:  Take 0.5 mg by mouth at bedtime. ) 90 tablet 0 09/19/2015 at Unknown time  . Carboxymethylcellul-Glycerin (OPTIVE) 0.5-0.9 % SOLN Place 1 drop into both eyes daily.    09/20/2015 at Unknown time  . DIGOX 125 MCG tablet TAKE 1 TABLET BY MOUTH  DAILY 90 tablet 2 09/20/2015 at Unknown time  . fenofibrate 160 MG tablet Take 1 tablet by mouth  daily (Patient taking differently: Take 1 tablet by mouth daily in evening) 90 tablet 0 09/19/2015 at 1000  . lisinopril (PRINIVIL,ZESTRIL) 20 MG tablet Take 1 tablet by mouth two  times daily 180 tablet 1 09/20/2015 at am  . Multiple Vitamin (MULTIVITAMIN) tablet Take 1 tablet by mouth daily.     09/20/2015 at Unknown time  . omeprazole (PRILOSEC) 20 MG capsule Take 1 capsule (20 mg total) by mouth daily. 90 capsule 3 09/20/2015 at Unknown time  . pravastatin (PRAVACHOL) 40 MG tablet Take 1 tablet by mouth  daily (Patient taking differently: Take 1 tablet by mouth daily in evening) 90 tablet 0 09/19/2015 at Unknown time  . spironolactone (ALDACTONE) 50 MG tablet Take  1 tablet by mouth  daily 90 tablet 2 09/20/2015 at Unknown time  . traMADol (ULTRAM) 50 MG tablet Take 1 tablet (50 mg total) by mouth every 6 (six) hours as needed. for pain 60 tablet 0 Past Month at Unknown time  . warfarin (COUMADIN) 1 MG tablet Take as directed by  Anticoagulation Clinic (Patient taking differently: Take 1-2 mg by mouth every morning. Takes 2mg  on Wed and Sat takes 1mg  all other days) 120 tablet 1 09/20/2015 at Unknown time    Assessment: Patient with therapeutic INR (2.25) at presentation in the emergency department for chest pain. Stress test was negative and medical team will not be persuing invasive therapy at this time. Pharmacy has been consulted to restart warfarin.   PTA: warfarin 2mg  weds and Saturday and 1mg  all other days  Goal of Therapy:  INR 2-3 Monitor platelets by anticoagulation protocol: Yes   Plan:  Warfarin 2mg  once tonight Monitor INR, CBC, and signs/ symptoms  of bleeding  Georga Bora, PharmD Clinical Pharmacist Pager: [phone] 09/21/2015 4:57 PM

## 2015-09-21 NOTE — Progress Notes (Signed)
CRITICAL VALUE ALERT  Critical value received:  Troponin 0.05  Date of notification:  09/21/2015  Time of notification:  [id number]  Critical value read back:Yes.    Nurse who received alert:  Venora Maples, RN   MD notified (1st page):  K. Schorr  Time of first page:  0131  Responding MD:  Continue to monitor. Will await for next lab result.

## 2015-09-21 NOTE — Consult Note (Signed)
Cardiology Consult    Patient ID: Deanna [name] MRN: [id number], DOB/AGE: 1926/08/31   Admit date: 09/20/2015 Date of Consult: 09/21/2015  Primary Physician: Eulas Post, MD Primary Cardiologist: New Requesting Provider: Tyrell Antonio Reason for Consultation: Chest pain  Patient Profile    82 yo female with PMH of chronic atrial fibrillation, Breast Ca, HTN, HLD, and moderate TR who presented to the [facility] ED with reports of chest pain and left hand numbness.   Past Medical History   Past Medical History:  Diagnosis Date  . Anxiety   . Arthritis   . Atrial fibrillation ([place])   . Barrett's esophagus   . Breast cancer ([place])   . Colon polyp 2009   TUBULAR ADENOMA  . Diverticulosis of colon (without mention of hemorrhage) 2009  . GERD (gastroesophageal reflux disease)   . Hyperlipidemia   . Hypertension   . Status post dilation of esophageal narrowing   . TRICUSPID REGURGITATION 10/23/2006    Past Surgical History:  Procedure Laterality Date  . ABDOMINAL HYSTERECTOMY    . bcc-face    . CATARACT EXTRACTION Bilateral   . MASTECTOMY Bilateral      Allergies  Allergies  Allergen Reactions  . Aspirin Other (See Comments)    REACTION: nervousness---tolerates ibuprofen  . Atorvastatin Other (See Comments)    myalgia    History of Present Illness    Deanna [name] is a 82 yo female with PMH of chronic atrial fibrillation, Breast Ca ( 40 years ago), HTN, HLD, and moderate TR. Reports she was diagnosed with atrial fibrillation over 10 years ago and was placed on digoxin and coumadin. She is currently followed by her PCP for management of her atrial fibrillation. Has seen a cardiologist remotely, but never had a cardiac work up in the past.   Reports being in her usual state of health up until yesterday around 12pm when she experienced a sudden onset of SSCP with radiation under her right breast. States this only lasted for a couple of minutes. Then developed pain her  left arm and numbness in the left hand. Symptoms were present for last than 30 minutes total.   On admission her EKG showed a-fib with diffuse ST depression but more pronounced ST depression in leads II, III than on previous tracings. Initial trop was neg, but repeat slightly elevated at 0.05. Her blood pressure was also noted to be markedly elevated, though she reports being compliant with home medications. Cr slightly increased at 1.5, with baseline around 1 early this year. INR was therapeutic on admission. She has not had any further episodes of chest pain since admission.   Inpatient Medications    . allopurinol  300 mg Oral Daily  . ALPRAZolam  0.5 mg Oral QHS  . amLODipine  5 mg Oral Daily  . aspirin  324 mg Oral Once  . digoxin  125 mcg Oral Daily  . fenofibrate  160 mg Oral Daily  . multivitamin with minerals  1 tablet Oral Daily  . pantoprazole  40 mg Oral Daily  . pravastatin  40 mg Oral Daily  . spironolactone  50 mg Oral Daily    Family History    Family History  Problem Relation Age of Onset  . Colon cancer Mother   . Hypertension Mother   . Colon cancer Father   . Breast cancer Other   . Colon polyps Son     Social History    Social History   Social History  .  Marital status: Married    Spouse name: N/A  . Number of children: 3  . Years of education: N/A   Occupational History  . retired Retired   Social History Main Topics  . Smoking status: Never Smoker  . Smokeless tobacco: Never Used  . Alcohol use 2.5 oz/week    5 drink(s) per week  . Drug use: No  . Sexual activity: Not on file   Other Topics Concern  . Not on file   Social History Narrative  . No narrative on file     Review of Systems    General:  No chills, fever, night sweats or weight changes.  Cardiovascular: See HPI Dermatological: No rash, lesions/masses Respiratory: No cough, dyspnea Urologic: No hematuria, dysuria Abdominal:   No nausea, vomiting, diarrhea, bright red  blood per rectum, melena, or hematemesis Neurologic:  No visual changes, wkns, changes in mental status, + numbness to left hand. All other systems reviewed and are otherwise negative except as noted above.  Physical Exam    Blood pressure (!) 175/73, pulse 62, temperature 97.8 F (36.6 C), temperature source Oral, resp. rate 20, height 5\' 3"  (1.6 m), weight 134 lb 11.2 oz (61.1 kg), SpO2 96 %.  General: Pleasant older caucasian female, NAD Psych: Normal affect. Neuro: Alert and oriented X 3. Moves all extremities spontaneously. HEENT: Normal  Neck: Supple without bruits or JVD. Lungs:  Resp regular and unlabored, CTA. Heart: Irregularly, irregular no s3, s4, 2/6 systolic murmur. Abdomen: Soft, non-tender, non-distended, BS + x 4.  Extremities: No clubbing, cyanosis or edema. DP/PT/Radials 2+ and equal bilaterally.  Labs    Troponin [facility] Test)  Recent Labs  09/20/15 1726  TROPIPOC 0.03    Recent Labs  09/21/15 0034  TROPONINI 0.05*   Lab Results  Component Value Date   WBC 7.9 09/20/2015   HGB 15.1 (H) 09/20/2015   HCT 46.7 (H) 09/20/2015   MCV 98.9 09/20/2015   PLT 261 09/20/2015    Recent Labs Lab 09/20/15 1711  NA 140  K 4.6  CL 104  CO2 23  BUN 21*  CREATININE 1.58*  CALCIUM 10.0  GLUCOSE 105*   Lab Results  Component Value Date   CHOL 246 (H) 08/16/2014   HDL 30.90 (L) 08/16/2014   LDLCALC 105 (H) 05/07/2013   TRIG (H) 08/16/2014    648.0 Triglyceride is over 400; calculations on Lipids are invalid.   No results found for: [facility]   Radiology Studies    Dg Chest 2 View  Result Date: 09/20/2015 CLINICAL DATA:  Syncope, chest pain EXAM: CHEST  2 VIEW COMPARISON:  None. FINDINGS: Borderline cardiomegaly. No infiltrate or pulmonary edema. Launch hiatal hernia is noted. Surgical clips are noted in right axilla. Osteopenia and degenerative changes thoracic spine. IMPRESSION: No active disease. Large hiatal hernia is noted. Surgical clips are  noted in right axilla. Electronically Signed   By: Lahoma Crocker M.D.   On: 09/20/2015 17:47    ECG & Cardiac Imaging    EKG: a-fib with diffuse ST depression but more pronounced ST depression in leads II, III than on previous tracings   Echo: 10/2006  SUMMARY - Overall left ventricular systolic function was normal. Left    ventricular ejection fraction was estimated , range being 55    % to 60 %. There were no left ventricular regional wall    motion abnormalities. - Aortic valve thickness was mildly increased. - There was mild mitral valvular regurgitation. - The  left atrium was mild to moderately dilated. - The pulmonary veins were grossly normal. - The right ventricle was mildly dilated. Right ventricular    systolic function was mildly reduced. - The estimated peak pulmonary artery systolic pressure was mildly    increased. - There was moderate tricuspid valvular regurgitation. Tricuspid    regurgitation grade was 3+ on a scale of 0 to 4+. - The right atrium was mild to moderately dilated.  Assessment & Plan    82 yo female with PMH of chronic atrial fibrillation, Breast Ca, HTN, HLD, and moderate TR who presented to the [facility] ED with reports of chest pain and left hand numbness.  1. Chest pain with moderate risk of cardiac etiology: EKG is slightly different this admission, with mild trop elevation. Does have risk factors including HTN and HLD, denies any hx of smoking. BP was markedly elevated on admission and remains elevated. Given symptoms and risk factors with trop bump will plan for lexiscan myoview today. Further recommendations to follow.   2. HTN: Markedly elevated on admission, some improvement but still elevated. Lisinopril held this admission given her elevated Cr. Appears norvasc has been added.   3. Chronic A-Fib: Home medications include dig and coumadin. Coumadin has held this admission, INR 2.24. Rate controlled on  telemetry.  -- This patients CHA2DS2-VASc Score and unadjusted Ischemic Stroke Rate (% per year) is equal to 4.8 % stroke rate/year from a score of 4 Above score calculated as 1 point each if present [CHF, HTN, DM, Vascular=MI/PAD/Aortic Plaque, Age if 65-74, or Female] Above score calculated as 2 points each if present [Age > 75, or Stroke/TIA/TE]  4. HLD: on statin, fenobibrate  5. AKI: Lisinopril held, monitor BMET   Signed, Reino Bellis, NP-C Pager [phone] 09/21/2015, 10:59 AM The patient has been seen in conjunction with Reino Bellis, NP-C. All aspects of care have been considered and discussed. The patient has been personally interviewed, examined, and all clinical data has been reviewed.   Atypical features and including a very short duration. She does have trivial elevation in troponin and diffuse repolarization abnormality (likely dig effect).  Further evaluation with myocardial perfusion imaging. If the study is high risk or shows evidence of LAD distribution ischemia, angiography will be needed. Otherwise conservative medical management at her age.

## 2015-09-21 NOTE — Care Management Obs Status (Signed)
[facility] NOTIFICATION   Patient Details  Name: Deanna [name] MRN: [id number] Date of Birth: [birth year]   Medicare Observation Status Notification Given:  Yes    Dawayne Patricia, RN 09/21/2015, 4:30 PM

## 2015-09-21 NOTE — H&P (Signed)
History and Physical    LIZANN DOOL [id number] DOB: [birth year] DOA: 09/20/2015  PCP: Eulas Post, MD  Patient coming from: Home.  Chief Complaint: Chest pain.  HPI: Deanna [name] is a 82 y.o. female with atrial fibrillation, hypertension, chronic kidney disease presents to the ER because of chest pain. Pain started having chest pain last afternoon while at home at rest which was retrosternal and unable to exactly describe the nature of the pain but was radiating to both arms. Lasted for 10 minutes and resolved. In the ER EKG was showing ST depression in the inferolateral leads. Chest x-ray and troponins were negative. Patient will be admitted for further management of chest pain. In addition patient's blood pressure was found to be markedly elevated. Patient states she has been compliant with her medications. Patient's INR is therapeutic.   ED Course: Cardiac markers were negative. EKG was showing ST depression in the inferolateral leads.  Review of Systems: As per HPI, rest all negative.   Past Medical History:  Diagnosis Date  . Anxiety   . Arthritis   . Atrial fibrillation ([place])   . Barrett's esophagus   . Breast cancer ([place])   . Colon polyp 2009   TUBULAR ADENOMA  . Diverticulosis of colon (without mention of hemorrhage) 2009  . GERD (gastroesophageal reflux disease)   . Hyperlipidemia   . Hypertension   . Status post dilation of esophageal narrowing   . TRICUSPID REGURGITATION 10/23/2006    Past Surgical History:  Procedure Laterality Date  . ABDOMINAL HYSTERECTOMY    . bcc-face    . CATARACT EXTRACTION Bilateral   . MASTECTOMY Bilateral      reports that she has never smoked. She has never used smokeless tobacco. She reports that she drinks about 2.5 oz of alcohol per week . She reports that she does not use drugs.  Allergies  Allergen Reactions  . Aspirin Other (See Comments)    REACTION: nervousness---tolerates ibuprofen  . Atorvastatin Other  (See Comments)    myalgia    Family History  Problem Relation Age of Onset  . Colon cancer Mother   . Hypertension Mother   . Colon cancer Father   . Breast cancer Other   . Colon polyps Son     Prior to Admission medications   Medication Sig Start Date End Date Taking? Authorizing Provider  acetaminophen (TYLENOL) 325 MG tablet Take 325 mg by mouth every 6 (six) hours as needed for moderate pain.    Yes Historical Provider, MD  allopurinol (ZYLOPRIM) 300 MG tablet Take 1 tablet by mouth  daily 06/20/15  Yes Eulas Post, MD  ALPRAZolam Duanne Moron) 0.5 MG tablet Take 1 tablet (0.5 mg total) by mouth at bedtime as needed for anxiety. Patient taking differently: Take 0.5 mg by mouth at bedtime.  09/02/15  Yes Eulas Post, MD  Carboxymethylcellul-Glycerin (OPTIVE) 0.5-0.9 % SOLN Place 1 drop into both eyes daily.    Yes Historical Provider, MD  DIGOX 125 MCG tablet TAKE 1 TABLET BY MOUTH  DAILY 05/24/15  Yes Eulas Post, MD  fenofibrate 160 MG tablet Take 1 tablet by mouth  daily Patient taking differently: Take 1 tablet by mouth daily in evening 09/02/15  Yes Eulas Post, MD  lisinopril (PRINIVIL,ZESTRIL) 20 MG tablet Take 1 tablet by mouth two  times daily 06/13/15  Yes Eulas Post, MD  Multiple Vitamin (MULTIVITAMIN) tablet Take 1 tablet by mouth daily.     Yes Historical  Provider, MD  omeprazole (PRILOSEC) 20 MG capsule Take 1 capsule (20 mg total) by mouth daily. 08/01/15  Yes Eulas Post, MD  pravastatin (PRAVACHOL) 40 MG tablet Take 1 tablet by mouth  daily Patient taking differently: Take 1 tablet by mouth daily in evening 09/02/15  Yes Eulas Post, MD  spironolactone (ALDACTONE) 50 MG tablet Take 1 tablet by mouth  daily 05/24/15  Yes Eulas Post, MD  traMADol (ULTRAM) 50 MG tablet Take 1 tablet (50 mg total) by mouth every 6 (six) hours as needed. for pain 08/01/15  Yes Eulas Post, MD  warfarin (COUMADIN) 1 MG tablet Take as directed by   Anticoagulation Clinic Patient taking differently: Take 1-2 mg by mouth every morning. Takes 2mg  on Wed and Sat takes 1mg  all other days 09/19/15  Yes Eulas Post, MD    Physical Exam: Vitals:   09/20/15 2100 09/20/15 2130 09/20/15 2200 09/20/15 2230  BP: 200/89 185/80 190/85 189/85  Pulse: 76 72 74 66  Resp: 20 18 14 17   Temp:      TempSrc:      SpO2: 96% 97% 97% 98%  Weight:      Height:          Constitutional: Not in distress. Vitals:   09/20/15 2100 09/20/15 2130 09/20/15 2200 09/20/15 2230  BP: 200/89 185/80 190/85 189/85  Pulse: 76 72 74 66  Resp: 20 18 14 17   Temp:      TempSrc:      SpO2: 96% 97% 97% 98%  Weight:      Height:       Eyes: Anicteric no pallor. ENMT: No discharge from the ears eyes nose or mouth. Neck: No mass felt. No JVD appreciated. Respiratory: No rhonchi or crepitations. Cardiovascular: S1 and S2 heard. Abdomen: Soft nontender bowel sounds present. No guarding or rigidity. Musculoskeletal: No edema. Skin: No rash. Neurologic: Alert awake oriented to time place and person. Moves all extremities. Psychiatric: Appears normal.   Labs on Admission: I have personally reviewed following labs and imaging studies  CBC:  Recent Labs Lab 09/20/15 1711  WBC 7.9  HGB 15.1*  HCT 46.7*  MCV 98.9  PLT 0000000   Basic Metabolic Panel:  Recent Labs Lab 09/20/15 1711  NA 140  K 4.6  CL 104  CO2 23  GLUCOSE 105*  BUN 21*  CREATININE 1.58*  CALCIUM 10.0   GFR: Estimated Creatinine Clearance: 20.8 mL/min (by C-G formula based on SCr of 1.58 mg/dL). Liver Function Tests: No results for input(s): AST, ALT, ALKPHOS, BILITOT, PROT, ALBUMIN in the last 168 hours. No results for input(s): LIPASE, AMYLASE in the last 168 hours. No results for input(s): AMMONIA in the last 168 hours. Coagulation Profile:  Recent Labs Lab 09/19/15 09/20/15 2038  INR 2.3 2.24   Cardiac Enzymes: No results for input(s): CKTOTAL, CKMB, CKMBINDEX,  TROPONINI in the last 168 hours. BNP (last 3 results) No results for input(s): PROBNP in the last 8760 hours. HbA1C: No results for input(s): HGBA1C in the last 72 hours. CBG: No results for input(s): GLUCAP in the last 168 hours. Lipid Profile: No results for input(s): CHOL, HDL, LDLCALC, TRIG, CHOLHDL, LDLDIRECT in the last 72 hours. Thyroid Function Tests: No results for input(s): TSH, T4TOTAL, FREET4, T3FREE, THYROIDAB in the last 72 hours. Anemia Panel: No results for input(s): VITAMINB12, FOLATE, FERRITIN, TIBC, IRON, RETICCTPCT in the last 72 hours. Urine analysis:    Component Value Date/Time   COLORURINE YELLOW  09/10/2013 1721   APPEARANCEUR CLEAR 09/10/2013 1721   LABSPEC 1.004 (L) 09/10/2013 1721   PHURINE 6.5 09/10/2013 1721   GLUCOSEU NEGATIVE 09/10/2013 1721   HGBUR NEGATIVE 09/10/2013 1721   HGBUR negative 03/02/2008 1312   BILIRUBINUR NEGATIVE 09/10/2013 1721   KETONESUR NEGATIVE 09/10/2013 1721   PROTEINUR NEGATIVE 09/10/2013 1721   UROBILINOGEN 0.2 09/10/2013 1721   NITRITE NEGATIVE 09/10/2013 1721   LEUKOCYTESUR SMALL (A) 09/10/2013 1721   Sepsis Labs: @LABRCNTIP (procalcitonin:4,lacticidven:4) )No results found for this or any previous visit (from the past 240 hour(s)).   Radiological Exams on Admission: Dg Chest 2 View  Result Date: 09/20/2015 CLINICAL DATA:  Syncope, chest pain EXAM: CHEST  2 VIEW COMPARISON:  None. FINDINGS: Borderline cardiomegaly. No infiltrate or pulmonary edema. Launch hiatal hernia is noted. Surgical clips are noted in right axilla. Osteopenia and degenerative changes thoracic spine. IMPRESSION: No active disease. Large hiatal hernia is noted. Surgical clips are noted in right axilla. Electronically Signed   By: Lahoma Crocker M.D.   On: 09/20/2015 17:47    EKG: Independently reviewed. A. fib with ST depression in inferolateral leads.  Assessment/Plan Principal Problem:   Chest pain Active Problems:   Hyperlipidemia   ATRIAL  FIBRILLATION   Hypertensive urgency    1. Chest pain - patient's EKG showing some ischemic changes. Will cycle cardiac markers. Patient is on Coumadin which we will hold and keep on heparin if INR is less than 2. Check 2-D echo. Consult cardiology in a.m. Patient is presently chest pain-free. Continue statins. 2. Hypertension uncontrolled - patient is on lisinopril twice daily one dose of which is due. I have also place patient on when necessary IV hydralazine. Continue spironolactone. Closely follow blood pressure trends which may be accounting for patient's chest pain. 3. Chronic kidney disease status 3-4 - closely follow creatinine. Any further worsening may have to hold spironolactone and lisinopril. 4. Atrial fibrillation - on digoxin. Check digoxin levels. Patient is on Coumadin which will be held and placed on heparin anticipation of procedure.   DVT prophylaxis: Heparin. Code Status: Full code.  Family Communication: Discussed with patient's daughter.  Disposition Plan: Home.  Consults called: None.  Admission status: Observation.    Rise Patience MD [facility] Pager [phone].  If 7PM-7AM, please contact night-coverage [email or web address] Password [email or web address]  09/21/2015, 12:10 AM

## 2015-09-22 ENCOUNTER — Other Ambulatory Visit (HOSPITAL_COMMUNITY): Payer: Medicare Other

## 2015-09-22 ENCOUNTER — Observation Stay (HOSPITAL_BASED_OUTPATIENT_CLINIC_OR_DEPARTMENT_OTHER): Payer: Medicare Other

## 2015-09-22 DIAGNOSIS — R0789 Other chest pain: Secondary | ICD-10-CM | POA: Diagnosis not present

## 2015-09-22 DIAGNOSIS — I482 Chronic atrial fibrillation: Secondary | ICD-10-CM | POA: Diagnosis not present

## 2015-09-22 DIAGNOSIS — R079 Chest pain, unspecified: Secondary | ICD-10-CM

## 2015-09-22 DIAGNOSIS — I160 Hypertensive urgency: Secondary | ICD-10-CM | POA: Diagnosis not present

## 2015-09-22 DIAGNOSIS — I209 Angina pectoris, unspecified: Secondary | ICD-10-CM | POA: Diagnosis not present

## 2015-09-22 DIAGNOSIS — E785 Hyperlipidemia, unspecified: Secondary | ICD-10-CM | POA: Diagnosis not present

## 2015-09-22 LAB — BASIC METABOLIC PANEL
Anion gap: 6 (ref 5–15)
BUN: 14 mg/dL (ref 6–20)
CO2: 28 mmol/L (ref 22–32)
Calcium: 9.9 mg/dL (ref 8.9–10.3)
Chloride: 108 mmol/L (ref 101–111)
Creatinine, Ser: 1.07 mg/dL — ABNORMAL HIGH (ref 0.44–1.00)
GFR calc Af Amer: 53 mL/min — ABNORMAL LOW (ref >60)
GFR calc non Af Amer: 45 mL/min — ABNORMAL LOW (ref >60)
Glucose, Bld: 104 mg/dL — ABNORMAL HIGH (ref 65–99)
Potassium: 4 mmol/L (ref 3.5–5.1)
Sodium: 142 mmol/L (ref 135–145)

## 2015-09-22 LAB — CBC
HCT: 43.3 % (ref 36.0–46.0)
Hemoglobin: 14.1 g/dL (ref 12.0–15.0)
MCH: 31.8 pg (ref 26.0–34.0)
MCHC: 32.6 g/dL (ref 30.0–36.0)
MCV: 97.5 fL (ref 78.0–100.0)
Platelets: 235 10*3/uL (ref 150–400)
RBC: 4.44 MIL/uL (ref 3.87–5.11)
RDW: 14.8 % (ref 11.5–15.5)
WBC: 8.3 10*3/uL (ref 4.0–10.5)

## 2015-09-22 LAB — PROTIME-INR
INR: 2.03
Prothrombin Time: 23.2 seconds — ABNORMAL HIGH (ref 11.4–15.2)

## 2015-09-22 LAB — ECHOCARDIOGRAM COMPLETE
Height: 63 in
Weight: 2155.2 oz

## 2015-09-22 MED ORDER — HYDRALAZINE HCL 25 MG PO TABS: 25.0000 mg | ORAL_TABLET | Freq: Three times a day (TID) | ORAL | 0 refills | Status: DC

## 2015-09-22 MED ORDER — AMLODIPINE BESYLATE 5 MG PO TABS: 5.0000 mg | ORAL_TABLET | Freq: Every day | ORAL | 0 refills | Status: DC

## 2015-09-22 NOTE — Progress Notes (Signed)
Patient Profile: 82 yo female with PMH of chronic atrial fibrillation, Breast Ca, HTN, HLD, and moderate TR who presented to the [facility] ED with reports of chest pain and left hand numbness. BP was markedly elevated.    Subjective: No complaints this morning. She denies CP and dyspnea.   Objective: Vital signs in last 24 hours: Temp:  [97.3 F (36.3 C)-98.5 F (36.9 C)] 98 F (36.7 C) (09/14 0514) Pulse Rate:  [61-82] 61 (09/14 0514) Resp:  [18-20] 18 (09/14 0514) BP: (116-187)/(54-84) 146/67 (09/14 0514) SpO2:  [95 %-98 %] 96 % (09/14 0514) Last BM Date: 09/20/15  Intake/Output from previous day: No intake/output data recorded. Intake/Output this shift: No intake/output data recorded.  Medications Current Facility-Administered Medications  Medication Dose Route Frequency Provider Last Rate Last Dose  . acetaminophen (TYLENOL) tablet 650 mg  650 mg Oral Q4H PRN Rise Patience, MD      . allopurinol (ZYLOPRIM) tablet 300 mg  300 mg Oral Daily Rise Patience, MD   300 mg at 09/21/15 1004  . ALPRAZolam Duanne Moron) tablet 0.5 mg  0.5 mg Oral QHS Rise Patience, MD   0.5 mg at 09/21/15 2105  . amLODipine (NORVASC) tablet 5 mg  5 mg Oral Daily Belkys A Regalado, MD   5 mg at 09/21/15 1004  . aspirin chewable tablet 324 mg  324 mg Oral Once Alfonzo Beers, MD      . digoxin (LANOXIN) tablet 125 mcg  125 mcg Oral Daily Rise Patience, MD   125 mcg at 09/21/15 1005  . fenofibrate tablet 160 mg  160 mg Oral Daily Rise Patience, MD   160 mg at 09/21/15 1006  . hydrALAZINE (APRESOLINE) injection 10 mg  10 mg Intravenous Q4H PRN Rise Patience, MD   10 mg at 09/21/15 1142  . hydrALAZINE (APRESOLINE) tablet 25 mg  25 mg Oral Q8H Belkys A Regalado, MD   25 mg at 09/22/15 0530  . multivitamin with minerals tablet 1 tablet  1 tablet Oral Daily Rise Patience, MD   1 tablet at 09/21/15 1004  . ondansetron (ZOFRAN) injection 4 mg  4 mg Intravenous Q6H PRN  Rise Patience, MD      . pantoprazole (PROTONIX) EC tablet 40 mg  40 mg Oral Daily Rise Patience, MD   40 mg at 09/21/15 1004  . pravastatin (PRAVACHOL) tablet 40 mg  40 mg Oral Daily Rise Patience, MD   40 mg at 09/21/15 1005  . spironolactone (ALDACTONE) tablet 50 mg  50 mg Oral Daily Rise Patience, MD   50 mg at 09/21/15 1004  . traMADol (ULTRAM) tablet 50 mg  50 mg Oral Q6H PRN Rise Patience, MD      . Warfarin - Pharmacist Dosing Inpatient   Does not apply q1800 Belkys A Regalado, MD        PE: General appearance: alert, cooperative and no distress Neck: no carotid bruit and no JVD Lungs: clear to auscultation bilaterally Heart: irregularly irregular rhythm and reg rate Extremities: no LEE Pulses: 2+ and symmetric Skin: warm and dry Neurologic: Grossly normal  Lab Results:   Recent Labs  09/20/15 1711 09/22/15 0357  WBC 7.9 8.3  HGB 15.1* 14.1  HCT 46.7* 43.3  PLT 261 235   BMET  Recent Labs  09/20/15 1711 09/21/15 1132 09/22/15 0357  NA 140 140 142  K 4.6 4.2 4.0  CL 104 104 108  CO2  23 27 28   GLUCOSE 105* 104* 104*  BUN 21* 14 14  CREATININE 1.58* 1.04* 1.07*  CALCIUM 10.0 10.2 9.9   PT/INR  Recent Labs  09/20/15 2038 09/21/15 0034 09/22/15 0357  LABPROT 25.1* 25.2* 23.2*  INR 2.24 2.25 2.03   Cardiac Panel (last 3 results)  Recent Labs  09/21/15 1132 09/21/15 1526 09/21/15 2139  TROPONINI 0.05* 0.05* 0.05*    Studies/Results: Study Result  NST 09-22-15    No T wave inversion was noted during stress.  There was no ST segment deviation noted during stress.  The study is normal.  This is a low risk study.  The left ventricular ejection fraction is hyperdynamic (>65%).   Normal resting and stress perfusion. No ischemia or infarction EF 79%    Assessment/Plan  Principal Problem:   Chest pain Active Problems:   Hyperlipidemia   ATRIAL FIBRILLATION   Hypertensive urgency   1. Chest Pain:  troponin with flat low level trend not c/w ACS. NST showed normal resting and stress perfusion. No ischemia or infarction. LVF hyperdynamic with EF >65%. Suspect CP and enzyme leak was likely secondary to elevated BP/ demand ischemia. Continue risk factor modification for primary prevention. Continue ASA, statin and antihypertensives.   2. Chronic Atrial Fibrillation: rate is well controlled on telemetry in the 60s. Continue digoxin for rate control. No indication for cath. Can resume warfarin.   3. HTN: improved but still mildly elevated. SCr improving. Consider restarting home lisinopril.   4. HLD: followed by her PCP. Continue statin therapy.   5. AKI: improving. SCr 1.58>>1.04.   No further cardiac w/u indicated at this time.    LOS: 0 days    Brittainy M. Ladoris Gene 09/22/2015 7:43 AM The patient has been seen in conjunction with Lyda Jester, PA-C. All aspects of care have been considered and discussed. The patient has been personally interviewed, examined, and all clinical data has been reviewed.   Agree with the above note. The patient is eligible for discharge without further cardiac workup. Will speak with Dr. Tyrell Antonio.

## 2015-09-22 NOTE — Progress Notes (Signed)
Patient to D/C home with daughter. Education done. BP 144/68. Personal belongings given to daughter. Tele monitor removed. CCMD notified. IV removed. Patient taken to D/C home with daughter.   Domingo Dimes RN

## 2015-09-22 NOTE — Discharge Instructions (Signed)

## 2015-09-22 NOTE — Discharge Summary (Signed)
Physician Discharge Summary  Deanna [name] [id number] DOB: [birth year] DOA: 09/20/2015  PCP: Eulas Post, MD  Admit date: 09/20/2015 Discharge date: 09/22/2015  Admitted From: Home  Disposition:  Home   Recommendations for Outpatient Follow-up:  1. Follow up with PCP in 1-2 weeks 2. Please obtain BMP/CBC in one week 3. If renal function stable could consider resuming lisinopril  4. ECHO pending     Discharge Condition: stable.  CODE STATUS: Full Code.  Diet recommendation: Heart Healthy    Brief/Interim Summary: Brief Narrative: Deanna [name] a 82 y.o.femalewith atrial fibrillation, hypertension, chronic kidney disease presents to the ER because of chest pain. Pain started having chest pain last afternoon while at home at rest which was retrosternal and unable to exactly describe the nature of the pain but was radiating to both arms. Lasted for 10 minutes and resolved. In the ER EKG was showing ST depression in the inferolateral leads. Chest x-ray and troponins were negative. Patient will be admitted for further management of chest pain. In addition patient's blood pressure was found to be markedly elevated. Patient states she has been compliant with her medications. Patient's INR is therapeutic.  ED Course:Cardiac markers were negative. EKG was showing ST depression in the inferolateral leads.   Assessment & Plan:  Chest pain- Continue statins. Mild elevation of troponin. Chest pain free.  Cardiology consulted.  stress test normal.   Hypertension urgency  Continue spironolactone.  Hold lisinopril, due to mild elevation cr.  Added  norvasc and hydralazine  Better controlled.  PRN hydralazine.   Chronic kidney disease status 3-4- closely follow creatinine. . Will hold Lisinopril at discharge.   -Improved, cr decreased to 1.1.   Atrial fibrillation- on digoxin. Resume coumadin     Discharge Diagnoses:  Principal Problem:   Chest pain Active  Problems:   Hyperlipidemia   ATRIAL FIBRILLATION   Hypertensive urgency    Discharge Instructions  Discharge Instructions    Diet - low sodium heart healthy    Complete by:  As directed    Increase activity slowly    Complete by:  As directed        Medication List    STOP taking these medications   lisinopril 20 MG tablet Commonly known as:  PRINIVIL,ZESTRIL     TAKE these medications   acetaminophen 325 MG tablet Commonly known as:  TYLENOL Take 325 mg by mouth every 6 (six) hours as needed for moderate pain.   allopurinol 300 MG tablet Commonly known as:  ZYLOPRIM Take 1 tablet by mouth  daily   ALPRAZolam 0.5 MG tablet Commonly known as:  XANAX Take 1 tablet (0.5 mg total) by mouth at bedtime as needed for anxiety. What changed:  when to take this   amLODipine 5 MG tablet Commonly known as:  NORVASC Take 1 tablet (5 mg total) by mouth daily. Start taking on:  09/23/2015   DIGOX 0.125 MG tablet Generic drug:  digoxin TAKE 1 TABLET BY MOUTH  DAILY   fenofibrate 160 MG tablet Take 1 tablet by mouth  daily What changed:  See the new instructions.   hydrALAZINE 25 MG tablet Commonly known as:  APRESOLINE Take 1 tablet (25 mg total) by mouth every 8 (eight) hours.   multivitamin tablet Take 1 tablet by mouth daily.   omeprazole 20 MG capsule Commonly known as:  PRILOSEC Take 1 capsule (20 mg total) by mouth daily.   OPTIVE 0.5-0.9 % Soln Generic drug:  Carboxymethylcellul-Glycerin Place 1 drop  into both eyes daily.   pravastatin 40 MG tablet Commonly known as:  PRAVACHOL Take 1 tablet by mouth  daily What changed:  See the new instructions.   spironolactone 50 MG tablet Commonly known as:  ALDACTONE Take 1 tablet by mouth  daily   traMADol 50 MG tablet Commonly known as:  ULTRAM Take 1 tablet (50 mg total) by mouth every 6 (six) hours as needed. for pain   warfarin 1 MG tablet Commonly known as:  COUMADIN Take as directed by   [facility]tico[facility] What changed:  how much to take  how to take this  when to take this  additional instructions       Allergies  Allergen Reactions  . Aspirin Other (See Comments)    REACTION: nervousness---tolerates ibuprofen  . Atorvastatin Other (See Comments)    myalgia    Consultations: Cardiology   Procedures/Studies: Dg Chest 2 View  Result Date: 09/20/2015 CLINICAL DATA:  Syncope, chest pain EXAM: CHEST  2 VIEW COMPARISON:  None. FINDINGS: Borderline cardiomegaly. No infiltrate or pulmonary edema. Launch hiatal hernia is noted. Surgical clips are noted in right axilla. Osteopenia and degenerative changes thoracic spine. IMPRESSION: No active disease. Large hiatal hernia is noted. Surgical clips are noted in right axilla. Electronically Signed   By: Lahoma Crocker M.D.   On: 09/20/2015 17:47   Nm Myocar Multi W/spect W/wall Motion / Ef  Result Date: 09/21/2015  No T wave inversion was noted during stress.  There was no ST segment deviation noted during stress.  The study is normal.  This is a low risk study.  The left ventricular ejection fraction is hyperdynamic (>65%).  Normal resting and stress perfusion. No ischemia or infarction EF 79%      Subjective: Feeling better, denies chest pain   Discharge Exam: Vitals:   09/22/15 0514 09/22/15 1050  BP: (!) 146/67 (!) 174/62  Pulse: 61 69  Resp: 18   Temp: 98 F (36.7 C)    Vitals:   09/21/15 1827 09/21/15 1938 09/22/15 0514 09/22/15 1050  BP: (!) 116/54 (!) 157/68 (!) 146/67 (!) 174/62  Pulse:  74 61 69  Resp:  20 18   Temp:  98.5 F (36.9 C) 98 F (36.7 C)   TempSrc:  Oral Oral   SpO2:  95% 96%   Weight:      Height:        General: Pt is alert, awake, not in acute distress Cardiovascular: RRR, S1/S2 +, no rubs, no gallops Respiratory: CTA bilaterally, no wheezing, no rhonchi Abdominal: Soft, NT, ND, bowel sounds + Extremities: no edema, no cyanosis    The results of significant  diagnostics from this hospitalization (including imaging, microbiology, ancillary and laboratory) are listed below for reference.     Microbiology: No results found for this or any previous visit (from the past 240 hour(s)).   Labs: BNP (last 3 results) No results for input(s): BNP in the last 8760 hours. Basic Metabolic Panel:  Recent Labs Lab 09/20/15 1711 09/21/15 1132 09/22/15 0357  NA 140 140 142  K 4.6 4.2 4.0  CL 104 104 108  CO2 23 27 28   GLUCOSE 105* 104* 104*  BUN 21* 14 14  CREATININE 1.58* 1.04* 1.07*  CALCIUM 10.0 10.2 9.9   Liver Function Tests: No results for input(s): AST, ALT, ALKPHOS, BILITOT, PROT, ALBUMIN in the last 168 hours. No results for input(s): LIPASE, AMYLASE in the last 168 hours. No results for input(s): AMMONIA in the  last 168 hours. CBC:  Recent Labs Lab 09/20/15 1711 09/22/15 0357  WBC 7.9 8.3  HGB 15.1* 14.1  HCT 46.7* 43.3  MCV 98.9 97.5  PLT 261 235   Cardiac Enzymes:  Recent Labs Lab 09/21/15 0034 09/21/15 1132 09/21/15 1526 09/21/15 2139  TROPONINI 0.05* 0.05* 0.05* 0.05*   BNP: Invalid input(s): POCBNP CBG: No results for input(s): GLUCAP in the last 168 hours. D-Dimer No results for input(s): DDIMER in the last 72 hours. Hgb A1c No results for input(s): HGBA1C in the last 72 hours. Lipid Profile No results for input(s): CHOL, HDL, LDLCALC, TRIG, CHOLHDL, LDLDIRECT in the last 72 hours. Thyroid function studies No results for input(s): TSH, T4TOTAL, T3FREE, THYROIDAB in the last 72 hours.  Invalid input(s): FREET3 Anemia work up No results for input(s): VITAMINB12, FOLATE, FERRITIN, TIBC, IRON, RETICCTPCT in the last 72 hours. Urinalysis    Component Value Date/Time   COLORURINE YELLOW 09/10/2013 1721   APPEARANCEUR CLEAR 09/10/2013 1721   LABSPEC 1.004 (L) 09/10/2013 1721   PHURINE 6.5 09/10/2013 1721   GLUCOSEU NEGATIVE 09/10/2013 1721   HGBUR NEGATIVE 09/10/2013 1721   HGBUR negative 03/02/2008 1312    BILIRUBINUR NEGATIVE 09/10/2013 1721   KETONESUR NEGATIVE 09/10/2013 1721   PROTEINUR NEGATIVE 09/10/2013 1721   UROBILINOGEN 0.2 09/10/2013 1721   NITRITE NEGATIVE 09/10/2013 1721   LEUKOCYTESUR SMALL (A) 09/10/2013 1721   Sepsis Labs Invalid input(s): PROCALCITONIN,  WBC,  LACTICIDVEN Microbiology No results found for this or any previous visit (from the past 240 hour(s)).   Time coordinating discharge: Over 30 minutes  SIGNED:   Elmarie Shiley, MD  [facility] 09/22/2015, 11:18 AM Pager   If 7PM-7AM, please contact night-coverage [email or web address] Password [email or web address]

## 2015-09-22 NOTE — Progress Notes (Signed)
  Echocardiogram 2D Echocardiogram has been performed.  Donata Clay 09/22/2015, 10:22 AM

## 2015-09-28 ENCOUNTER — Telehealth: Payer: Self-pay | Admitting: Family Medicine

## 2015-09-28 NOTE — Telephone Encounter (Signed)
° °  Pt daughter call to ask if Dr Elease Hashimoto has received test pt had in the hosptial. Daughter was tld that Dr Elease Hashimoto would contact them concerning er echo cardio gram   Daughter would like a call back

## 2015-09-29 NOTE — Telephone Encounter (Signed)
Pts daughter is aware via voicemail that you are not in the office today but I will forward message to you so that you can review the results. Thanks.

## 2015-09-30 NOTE — Telephone Encounter (Signed)
I spoke with daughter and reviewed Echo results-moderate to severe Tricuspid regurgitation.  Will discuss further at follow up and also set up cardiology follow up to follow with [place].

## 2015-10-05 ENCOUNTER — Ambulatory Visit (INDEPENDENT_AMBULATORY_CARE_PROVIDER_SITE_OTHER): Payer: Medicare Other | Admitting: General Practice

## 2015-10-05 DIAGNOSIS — Z5181 Encounter for therapeutic drug level monitoring: Secondary | ICD-10-CM | POA: Diagnosis not present

## 2015-10-05 LAB — POCT INR: INR: 2.4

## 2015-10-14 ENCOUNTER — Telehealth: Payer: Self-pay | Admitting: Family Medicine

## 2015-10-14 NOTE — Telephone Encounter (Signed)
Pt needs refill on amlodipine 5 mg #30 and hydralazine 25 mg #90 for 30 day supply  w/refills sent to [facility]

## 2015-10-16 NOTE — Telephone Encounter (Signed)
Refill for one month.  I have not seen her since last hospitalization and she needs office follow up.

## 2015-10-17 ENCOUNTER — Ambulatory Visit: Payer: Medicare Other

## 2015-10-17 ENCOUNTER — Telehealth: Payer: Self-pay | Admitting: Family Medicine

## 2015-10-17 MED ORDER — HYDRALAZINE HCL 25 MG PO TABS: 25.0000 mg | ORAL_TABLET | Freq: Three times a day (TID) | ORAL | 0 refills | Status: DC

## 2015-10-17 MED ORDER — AMLODIPINE BESYLATE 5 MG PO TABS: 5.0000 mg | ORAL_TABLET | Freq: Every day | ORAL | 0 refills | Status: DC

## 2015-10-17 NOTE — Telephone Encounter (Signed)
Rxs done. 

## 2015-10-17 NOTE — Telephone Encounter (Signed)
Pharmacy need clarification on the medication that was sent in for 30 tablets and pt has a appointment on 11/14/15.

## 2015-10-17 NOTE — Telephone Encounter (Signed)
Dr. Elease Hashimoto, please clarify directions for Hydralazine, is pt suppose to take 3 times a day?

## 2015-10-17 NOTE — Telephone Encounter (Signed)
The patient stats that the hydrALAZINE (APRESOLINE) 25 MG tablet   won't last her 30 days with taking the medication 3 x daily.

## 2015-10-17 NOTE — Telephone Encounter (Signed)
Pt needs hospital follow up.  We need to clarify her medications.  Hydralazine is dosed generally TID.  Refill until she can get in for follow up.

## 2015-10-18 MED ORDER — AMLODIPINE BESYLATE 5 MG PO TABS: 5.0000 mg | ORAL_TABLET | Freq: Every day | ORAL | 1 refills | Status: DC

## 2015-10-18 MED ORDER — HYDRALAZINE HCL 25 MG PO TABS: 25.0000 mg | ORAL_TABLET | Freq: Three times a day (TID) | ORAL | 1 refills | Status: DC

## 2015-10-18 NOTE — Telephone Encounter (Signed)
Pt has a pending appt 11/14/15.

## 2015-10-20 ENCOUNTER — Encounter: Payer: Self-pay | Admitting: Family Medicine

## 2015-10-21 ENCOUNTER — Encounter: Payer: Self-pay | Admitting: Family Medicine

## 2015-10-21 ENCOUNTER — Ambulatory Visit (INDEPENDENT_AMBULATORY_CARE_PROVIDER_SITE_OTHER): Payer: Medicare Other | Admitting: Family Medicine

## 2015-10-21 ENCOUNTER — Other Ambulatory Visit: Payer: Self-pay | Admitting: Family Medicine

## 2015-10-21 VITALS — BP 178/70 | HR 93 | Temp 98.2°F | Ht 63.0 in | Wt 123.9 lb

## 2015-10-21 DIAGNOSIS — I10 Essential (primary) hypertension: Secondary | ICD-10-CM

## 2015-10-21 DIAGNOSIS — N289 Disorder of kidney and ureter, unspecified: Secondary | ICD-10-CM | POA: Diagnosis not present

## 2015-10-21 DIAGNOSIS — Z23 Encounter for immunization: Secondary | ICD-10-CM | POA: Diagnosis not present

## 2015-10-21 DIAGNOSIS — R079 Chest pain, unspecified: Secondary | ICD-10-CM

## 2015-10-21 LAB — BASIC METABOLIC PANEL
BUN: 18 mg/dL (ref 6–23)
CO2: 30 mEq/L (ref 19–32)
Calcium: 10.9 mg/dL — ABNORMAL HIGH (ref 8.4–10.5)
Chloride: 99 mEq/L (ref 96–112)
Creatinine, Ser: 1.04 mg/dL (ref 0.40–1.20)
GFR: 53.25 mL/min — ABNORMAL LOW (ref >60.00)
Glucose, Bld: 115 mg/dL — ABNORMAL HIGH (ref 70–99)
Potassium: 5 mEq/L (ref 3.5–5.1)
Sodium: 138 mEq/L (ref 135–145)

## 2015-10-21 MED ORDER — AMLODIPINE BESYLATE 5 MG PO TABS: 5.0000 mg | ORAL_TABLET | Freq: Every day | ORAL | 3 refills | Status: DC

## 2015-10-21 MED ORDER — HYDRALAZINE HCL 25 MG PO TABS: 25.0000 mg | ORAL_TABLET | Freq: Three times a day (TID) | ORAL | 3 refills | Status: DC

## 2015-10-21 NOTE — Patient Instructions (Signed)
Monitor blood pressure pressure and be in touch if consistently > 150/90 Bring your BP cuff at follow up.

## 2015-10-21 NOTE — Progress Notes (Signed)
Pre visit review using our clinic review tool, if applicable. No additional management support is needed unless otherwise documented below in the visit note. 

## 2015-10-21 NOTE — Progress Notes (Signed)
Subjective:     Patient ID: Deanna [name], female   DOB: 02-Jun-1926, 82 y.o.   MRN: [id number]  HPI Patient seen for hospital follow-up. She was admitted on 9/12 with elevated blood pressure but actually went because of some chest pain. Her EKG reportedly had some ST depression inferolateral leads. Chest x-ray and troponins were unremarkable. Her blood pressure was elevated and she had some acute renal insufficiency. Lisinopril was discontinued and she was maintained on spirinolactone with addition of Norvasc and hydralazine. Her creatinine at discharge 1.1. She has chronic atrial fibrillation on Coumadin. Should called last week requesting refills of Norvasc and hydralazine and we had not seen her for hospital follow up and advised appointment..  She has long-standing history of probable whitecoat syndrome. She's had consistently good blood pressures at home since discharge mostly [id number] systolic these were also confirmed by home health nurse. She is somewhat elevated today. She's had no further chest pain. She had echocardiogram with ejection fraction 55-60% and nuclear stress test with no evidence for ischemia. Recommendation to repeat renal function today. Denies any dizziness or chest pains. Still needs flu shot  Past Medical History:  Diagnosis Date  . Anxiety   . Arthritis   . Atrial fibrillation ([place])   . Barrett's esophagus   . Breast cancer ([place])   . Colon polyp 2009   TUBULAR ADENOMA  . Diverticulosis of colon (without mention of hemorrhage) 2009  . GERD (gastroesophageal reflux disease)   . Hyperlipidemia   . Hypertension   . Status post dilation of esophageal narrowing   . TRICUSPID REGURGITATION 10/23/2006   Past Surgical History:  Procedure Laterality Date  . ABDOMINAL HYSTERECTOMY    . bcc-face    . CATARACT EXTRACTION Bilateral   . MASTECTOMY Bilateral     reports that she has never smoked. She has never used smokeless tobacco. She reports that she drinks about 2.5 oz  of alcohol per week . She reports that she does not use drugs. family history includes Breast cancer in her other; Colon cancer in her father and mother; Colon polyps in her son; Hypertension in her mother. Allergies  Allergen Reactions  . Aspirin Other (See Comments)    REACTION: nervousness---tolerates ibuprofen  . Atorvastatin Other (See Comments)    myalgia     Review of Systems  Constitutional: Negative for fatigue.  Eyes: Negative for visual disturbance.  Respiratory: Negative for cough, chest tightness, shortness of breath and wheezing.   Cardiovascular: Negative for chest pain, palpitations and leg swelling.  Gastrointestinal: Negative for abdominal pain.  Endocrine: Negative for polydipsia and polyuria.  Genitourinary: Negative for dysuria.  Neurological: Negative for dizziness, seizures, syncope, weakness, light-headedness and headaches.       Objective:   Physical Exam  Constitutional: She appears well-developed and well-nourished.  Eyes: Pupils are equal, round, and reactive to light.  Neck: Neck supple. No JVD present. No thyromegaly present.  Cardiovascular: Normal rate and regular rhythm.  Exam reveals no gallop.   Pulmonary/Chest: Effort normal and breath sounds normal. No respiratory distress. She has no wheezes. She has no rales.  Musculoskeletal: She exhibits no edema.  Neurological: She is alert.       Assessment:     #1 hypertension-probable whitecoat syndrome. Blood pressures consistently well controlled at home but slightly up today  #2 recent chest pain with rule out for MI and no ischemia on nuclear stress test  #3 recent acute renal insufficiency.      Plan:     -  Refill hydralazine and amlodipine for one year -Continue to monitor blood pressure closely at home -Flu vaccine given -Bring her cuff to compare with ours at follow-up visit in 3 months -Recheck basic metabolic panel today.  Eulas Post MD [facility] at  [facility]

## 2015-10-25 ENCOUNTER — Telehealth: Payer: Self-pay | Admitting: Family Medicine

## 2015-10-25 NOTE — Telephone Encounter (Signed)
Daughter is aware of results.

## 2015-10-25 NOTE — Telephone Encounter (Signed)
Pt had labs on 10/13 and daughter would like a call back with results. Pt doesn't remember about the appointment details either.  Daughter had been unable to come with pt.

## 2015-10-31 ENCOUNTER — Ambulatory Visit: Payer: Medicare Other

## 2015-11-14 ENCOUNTER — Ambulatory Visit (INDEPENDENT_AMBULATORY_CARE_PROVIDER_SITE_OTHER): Payer: Medicare Other | Admitting: General Practice

## 2015-11-14 DIAGNOSIS — Z5181 Encounter for therapeutic drug level monitoring: Secondary | ICD-10-CM | POA: Diagnosis not present

## 2015-11-14 DIAGNOSIS — I4891 Unspecified atrial fibrillation: Secondary | ICD-10-CM | POA: Diagnosis not present

## 2015-11-14 LAB — POCT INR: INR: 1.7

## 2015-11-23 DIAGNOSIS — M109 Gout, unspecified: Secondary | ICD-10-CM | POA: Diagnosis not present

## 2015-11-23 DIAGNOSIS — I10 Essential (primary) hypertension: Secondary | ICD-10-CM | POA: Diagnosis not present

## 2015-11-23 DIAGNOSIS — I482 Chronic atrial fibrillation: Secondary | ICD-10-CM | POA: Diagnosis not present

## 2015-11-23 DIAGNOSIS — K219 Gastro-esophageal reflux disease without esophagitis: Secondary | ICD-10-CM | POA: Diagnosis not present

## 2015-11-28 ENCOUNTER — Ambulatory Visit: Payer: Medicare Other | Admitting: Family Medicine

## 2015-12-12 ENCOUNTER — Ambulatory Visit: Payer: Medicare Other

## 2015-12-12 DIAGNOSIS — Z7901 Long term (current) use of anticoagulants: Secondary | ICD-10-CM | POA: Diagnosis not present

## 2015-12-22 ENCOUNTER — Other Ambulatory Visit: Payer: Self-pay | Admitting: Emergency Medicine

## 2015-12-22 ENCOUNTER — Telehealth: Payer: Self-pay | Admitting: Emergency Medicine

## 2015-12-22 NOTE — Telephone Encounter (Signed)
Pt would like a refill for Alprazolam 0.5mg  take one tablet at night bedtime. Pt was last seen 10/21/2015 for hypertension, chest pain, and acute renal insufficiency. Please advise refill.

## 2015-12-22 NOTE — Telephone Encounter (Signed)
Last OV 10-21-2015  Last refill 08/01/2015 #60 Please advise

## 2015-12-22 NOTE — Telephone Encounter (Signed)
Pt would like a refill for Alprazolam 0.5mg  tablet by mouth bedtime. Pt was last seen 10/21/2015 for hypertension, chest pain, and acute renal insufficiency. Please advise refill.

## 2015-12-23 NOTE — Telephone Encounter (Signed)
Pt is no longer seeing Dr. Elease Hashimoto, her new PCP is Dr. Elsie Saas you change her PCP?

## 2015-12-23 NOTE — Telephone Encounter (Signed)
Refill OK

## 2015-12-26 DIAGNOSIS — Z7901 Long term (current) use of anticoagulants: Secondary | ICD-10-CM | POA: Diagnosis not present

## 2015-12-27 NOTE — Telephone Encounter (Signed)
done

## 2016-01-12 ENCOUNTER — Other Ambulatory Visit: Payer: Self-pay | Admitting: Nurse Practitioner

## 2016-01-12 ENCOUNTER — Ambulatory Visit
Admission: RE | Admit: 2016-01-12 | Discharge: 2016-01-12 | Disposition: A | Discharge status: Home / Self Care | Payer: Medicare Other | Source: Ambulatory Visit | Attending: Nurse Practitioner | Admitting: Nurse Practitioner

## 2016-01-12 DIAGNOSIS — J069 Acute upper respiratory infection, unspecified: Secondary | ICD-10-CM | POA: Diagnosis not present

## 2016-01-12 DIAGNOSIS — Z7901 Long term (current) use of anticoagulants: Secondary | ICD-10-CM | POA: Diagnosis not present

## 2016-01-12 DIAGNOSIS — R05 Cough: Secondary | ICD-10-CM | POA: Diagnosis not present

## 2016-01-19 DIAGNOSIS — J40 Bronchitis, not specified as acute or chronic: Secondary | ICD-10-CM | POA: Diagnosis not present

## 2016-01-19 DIAGNOSIS — I10 Essential (primary) hypertension: Secondary | ICD-10-CM | POA: Diagnosis not present

## 2016-01-19 DIAGNOSIS — Z79899 Other long term (current) drug therapy: Secondary | ICD-10-CM | POA: Diagnosis not present

## 2016-01-19 DIAGNOSIS — I482 Chronic atrial fibrillation: Secondary | ICD-10-CM | POA: Diagnosis not present

## 2016-01-19 DIAGNOSIS — Z7901 Long term (current) use of anticoagulants: Secondary | ICD-10-CM | POA: Diagnosis not present

## 2016-01-24 ENCOUNTER — Ambulatory Visit: Payer: Medicare Other | Admitting: Family Medicine

## 2016-01-27 ENCOUNTER — Encounter: Payer: Self-pay | Admitting: Family Medicine

## 2016-01-27 DIAGNOSIS — Z85828 Personal history of other malignant neoplasm of skin: Secondary | ICD-10-CM | POA: Diagnosis not present

## 2016-01-27 DIAGNOSIS — L821 Other seborrheic keratosis: Secondary | ICD-10-CM | POA: Diagnosis not present

## 2016-01-27 DIAGNOSIS — C44622 Squamous cell carcinoma of skin of right upper limb, including shoulder: Secondary | ICD-10-CM | POA: Diagnosis not present

## 2016-01-27 DIAGNOSIS — L570 Actinic keratosis: Secondary | ICD-10-CM | POA: Diagnosis not present

## 2016-01-31 ENCOUNTER — Ambulatory Visit: Payer: Self-pay | Admitting: General Practice

## 2016-02-07 DIAGNOSIS — I482 Chronic atrial fibrillation: Secondary | ICD-10-CM | POA: Diagnosis not present

## 2016-02-07 DIAGNOSIS — I10 Essential (primary) hypertension: Secondary | ICD-10-CM | POA: Diagnosis not present

## 2016-02-07 DIAGNOSIS — J3489 Other specified disorders of nose and nasal sinuses: Secondary | ICD-10-CM | POA: Diagnosis not present

## 2016-02-23 DIAGNOSIS — Z7901 Long term (current) use of anticoagulants: Secondary | ICD-10-CM | POA: Diagnosis not present

## 2016-03-22 DIAGNOSIS — Z7901 Long term (current) use of anticoagulants: Secondary | ICD-10-CM | POA: Diagnosis not present

## 2016-04-19 DIAGNOSIS — Z7901 Long term (current) use of anticoagulants: Secondary | ICD-10-CM | POA: Diagnosis not present

## 2016-05-17 DIAGNOSIS — Z7901 Long term (current) use of anticoagulants: Secondary | ICD-10-CM | POA: Diagnosis not present

## 2016-06-07 ENCOUNTER — Other Ambulatory Visit: Payer: Self-pay | Admitting: Family Medicine

## 2016-06-14 DIAGNOSIS — Z7901 Long term (current) use of anticoagulants: Secondary | ICD-10-CM | POA: Diagnosis not present

## 2016-06-29 DIAGNOSIS — I10 Essential (primary) hypertension: Secondary | ICD-10-CM | POA: Diagnosis not present

## 2016-06-29 DIAGNOSIS — R7301 Impaired fasting glucose: Secondary | ICD-10-CM | POA: Diagnosis not present

## 2016-06-29 DIAGNOSIS — E7800 Pure hypercholesterolemia, unspecified: Secondary | ICD-10-CM | POA: Diagnosis not present

## 2016-06-29 DIAGNOSIS — Z0000 Encounter for general adult medical examination without abnormal findings: Secondary | ICD-10-CM | POA: Diagnosis not present

## 2016-06-29 DIAGNOSIS — Z79899 Other long term (current) drug therapy: Secondary | ICD-10-CM | POA: Diagnosis not present

## 2016-06-29 DIAGNOSIS — I482 Chronic atrial fibrillation: Secondary | ICD-10-CM | POA: Diagnosis not present

## 2016-07-05 DIAGNOSIS — J329 Chronic sinusitis, unspecified: Secondary | ICD-10-CM | POA: Diagnosis not present

## 2016-07-06 ENCOUNTER — Other Ambulatory Visit: Payer: Self-pay | Admitting: Otolaryngology

## 2016-07-06 DIAGNOSIS — J329 Chronic sinusitis, unspecified: Secondary | ICD-10-CM

## 2016-07-09 ENCOUNTER — Ambulatory Visit
Admission: RE | Admit: 2016-07-09 | Discharge: 2016-07-09 | Disposition: A | Discharge status: Home / Self Care | Payer: Medicare Other | Source: Ambulatory Visit | Attending: Otolaryngology | Admitting: Otolaryngology

## 2016-07-09 DIAGNOSIS — J329 Chronic sinusitis, unspecified: Secondary | ICD-10-CM

## 2016-07-12 DIAGNOSIS — Z7901 Long term (current) use of anticoagulants: Secondary | ICD-10-CM | POA: Diagnosis not present

## 2016-07-12 DIAGNOSIS — E782 Mixed hyperlipidemia: Secondary | ICD-10-CM | POA: Diagnosis not present

## 2016-08-06 DIAGNOSIS — I482 Chronic atrial fibrillation: Secondary | ICD-10-CM | POA: Diagnosis not present

## 2016-08-06 DIAGNOSIS — N183 Chronic kidney disease, stage 3 (moderate): Secondary | ICD-10-CM | POA: Diagnosis not present

## 2016-08-06 DIAGNOSIS — M1A40X0 Other secondary chronic gout, unspecified site, without tophus (tophi): Secondary | ICD-10-CM | POA: Diagnosis not present

## 2016-08-06 DIAGNOSIS — L821 Other seborrheic keratosis: Secondary | ICD-10-CM | POA: Diagnosis not present

## 2016-08-06 DIAGNOSIS — I129 Hypertensive chronic kidney disease with stage 1 through stage 4 chronic kidney disease, or unspecified chronic kidney disease: Secondary | ICD-10-CM | POA: Diagnosis not present

## 2016-08-06 DIAGNOSIS — C44311 Basal cell carcinoma of skin of nose: Secondary | ICD-10-CM | POA: Diagnosis not present

## 2016-08-20 DIAGNOSIS — Z7901 Long term (current) use of anticoagulants: Secondary | ICD-10-CM | POA: Diagnosis not present

## 2016-08-24 DIAGNOSIS — Z7901 Long term (current) use of anticoagulants: Secondary | ICD-10-CM | POA: Diagnosis not present

## 2016-08-29 DIAGNOSIS — Z7901 Long term (current) use of anticoagulants: Secondary | ICD-10-CM | POA: Diagnosis not present

## 2016-09-11 DIAGNOSIS — I129 Hypertensive chronic kidney disease with stage 1 through stage 4 chronic kidney disease, or unspecified chronic kidney disease: Secondary | ICD-10-CM | POA: Diagnosis not present

## 2016-09-11 DIAGNOSIS — M25552 Pain in left hip: Secondary | ICD-10-CM | POA: Diagnosis not present

## 2016-09-11 DIAGNOSIS — N183 Chronic kidney disease, stage 3 (moderate): Secondary | ICD-10-CM | POA: Diagnosis not present

## 2016-09-13 DIAGNOSIS — M1612 Unilateral primary osteoarthritis, left hip: Secondary | ICD-10-CM | POA: Diagnosis not present

## 2016-09-13 DIAGNOSIS — M25552 Pain in left hip: Secondary | ICD-10-CM | POA: Diagnosis not present

## 2016-09-26 DIAGNOSIS — Z7901 Long term (current) use of anticoagulants: Secondary | ICD-10-CM | POA: Diagnosis not present

## 2016-10-03 DIAGNOSIS — Z7901 Long term (current) use of anticoagulants: Secondary | ICD-10-CM | POA: Diagnosis not present

## 2016-10-10 DIAGNOSIS — Z7901 Long term (current) use of anticoagulants: Secondary | ICD-10-CM | POA: Diagnosis not present

## 2016-10-16 DIAGNOSIS — Z961 Presence of intraocular lens: Secondary | ICD-10-CM | POA: Diagnosis not present

## 2016-11-05 DIAGNOSIS — Z7901 Long term (current) use of anticoagulants: Secondary | ICD-10-CM | POA: Diagnosis not present

## 2016-11-05 DIAGNOSIS — I129 Hypertensive chronic kidney disease with stage 1 through stage 4 chronic kidney disease, or unspecified chronic kidney disease: Secondary | ICD-10-CM | POA: Diagnosis not present

## 2016-11-05 DIAGNOSIS — R7303 Prediabetes: Secondary | ICD-10-CM | POA: Diagnosis not present

## 2016-11-05 DIAGNOSIS — Z79899 Other long term (current) drug therapy: Secondary | ICD-10-CM | POA: Diagnosis not present

## 2016-11-05 DIAGNOSIS — Z23 Encounter for immunization: Secondary | ICD-10-CM | POA: Diagnosis not present

## 2016-11-05 DIAGNOSIS — H6122 Impacted cerumen, left ear: Secondary | ICD-10-CM | POA: Diagnosis not present

## 2016-11-05 DIAGNOSIS — I482 Chronic atrial fibrillation: Secondary | ICD-10-CM | POA: Diagnosis not present

## 2016-12-03 DIAGNOSIS — Z7901 Long term (current) use of anticoagulants: Secondary | ICD-10-CM | POA: Diagnosis not present

## 2016-12-10 DIAGNOSIS — M25552 Pain in left hip: Secondary | ICD-10-CM | POA: Diagnosis not present

## 2016-12-10 DIAGNOSIS — I129 Hypertensive chronic kidney disease with stage 1 through stage 4 chronic kidney disease, or unspecified chronic kidney disease: Secondary | ICD-10-CM | POA: Diagnosis not present

## 2016-12-10 DIAGNOSIS — Z7901 Long term (current) use of anticoagulants: Secondary | ICD-10-CM | POA: Diagnosis not present

## 2016-12-10 DIAGNOSIS — R7303 Prediabetes: Secondary | ICD-10-CM | POA: Diagnosis not present

## 2016-12-10 DIAGNOSIS — M25511 Pain in right shoulder: Secondary | ICD-10-CM | POA: Diagnosis not present

## 2016-12-11 DIAGNOSIS — M25552 Pain in left hip: Secondary | ICD-10-CM | POA: Diagnosis not present

## 2016-12-11 DIAGNOSIS — M545 Low back pain: Secondary | ICD-10-CM | POA: Diagnosis not present

## 2016-12-14 DIAGNOSIS — M545 Low back pain: Secondary | ICD-10-CM | POA: Diagnosis not present

## 2016-12-18 DIAGNOSIS — R6889 Other general symptoms and signs: Secondary | ICD-10-CM | POA: Diagnosis not present

## 2016-12-18 DIAGNOSIS — M5116 Intervertebral disc disorders with radiculopathy, lumbar region: Secondary | ICD-10-CM | POA: Diagnosis not present

## 2016-12-18 DIAGNOSIS — M545 Low back pain: Secondary | ICD-10-CM | POA: Diagnosis not present

## 2016-12-24 DIAGNOSIS — Z7901 Long term (current) use of anticoagulants: Secondary | ICD-10-CM | POA: Diagnosis not present

## 2017-01-11 DIAGNOSIS — M7062 Trochanteric bursitis, left hip: Secondary | ICD-10-CM | POA: Diagnosis not present

## 2017-01-23 DIAGNOSIS — Z7901 Long term (current) use of anticoagulants: Secondary | ICD-10-CM | POA: Diagnosis not present

## 2017-01-28 DIAGNOSIS — L821 Other seborrheic keratosis: Secondary | ICD-10-CM | POA: Diagnosis not present

## 2017-01-28 DIAGNOSIS — D485 Neoplasm of uncertain behavior of skin: Secondary | ICD-10-CM | POA: Diagnosis not present

## 2017-01-28 DIAGNOSIS — C44612 Basal cell carcinoma of skin of right upper limb, including shoulder: Secondary | ICD-10-CM | POA: Diagnosis not present

## 2017-01-28 DIAGNOSIS — C44519 Basal cell carcinoma of skin of other part of trunk: Secondary | ICD-10-CM | POA: Diagnosis not present

## 2017-01-28 DIAGNOSIS — L570 Actinic keratosis: Secondary | ICD-10-CM | POA: Diagnosis not present

## 2017-01-28 DIAGNOSIS — Z85828 Personal history of other malignant neoplasm of skin: Secondary | ICD-10-CM | POA: Diagnosis not present

## 2017-01-30 DIAGNOSIS — Z7901 Long term (current) use of anticoagulants: Secondary | ICD-10-CM | POA: Diagnosis not present

## 2017-02-06 DIAGNOSIS — Z7901 Long term (current) use of anticoagulants: Secondary | ICD-10-CM | POA: Diagnosis not present

## 2017-03-06 DIAGNOSIS — N183 Chronic kidney disease, stage 3 (moderate): Secondary | ICD-10-CM | POA: Diagnosis not present

## 2017-03-06 DIAGNOSIS — I129 Hypertensive chronic kidney disease with stage 1 through stage 4 chronic kidney disease, or unspecified chronic kidney disease: Secondary | ICD-10-CM | POA: Diagnosis not present

## 2017-03-06 DIAGNOSIS — Z7901 Long term (current) use of anticoagulants: Secondary | ICD-10-CM | POA: Diagnosis not present

## 2017-03-06 DIAGNOSIS — I482 Chronic atrial fibrillation: Secondary | ICD-10-CM | POA: Diagnosis not present

## 2017-03-06 DIAGNOSIS — Z79899 Other long term (current) drug therapy: Secondary | ICD-10-CM | POA: Diagnosis not present

## 2017-03-26 ENCOUNTER — Encounter: Payer: Self-pay | Admitting: Podiatry

## 2017-03-26 ENCOUNTER — Ambulatory Visit (INDEPENDENT_AMBULATORY_CARE_PROVIDER_SITE_OTHER): Payer: Medicare Other

## 2017-03-26 ENCOUNTER — Ambulatory Visit: Payer: Medicare Other | Admitting: Podiatry

## 2017-03-26 DIAGNOSIS — M2041 Other hammer toe(s) (acquired), right foot: Secondary | ICD-10-CM

## 2017-03-26 DIAGNOSIS — L84 Corns and callosities: Secondary | ICD-10-CM | POA: Diagnosis not present

## 2017-03-26 DIAGNOSIS — Z7901 Long term (current) use of anticoagulants: Secondary | ICD-10-CM | POA: Diagnosis not present

## 2017-03-26 NOTE — Progress Notes (Signed)
Dg foot  

## 2017-03-26 NOTE — Patient Instructions (Signed)
Hammer Toe Hammer toe is a change in the shape (a deformity) of your second, third, or fourth toe. The deformity causes the middle joint of your toe to stay bent. This causes pain, especially when you are wearing shoes. Hammer toe starts gradually. At first, the toe can be straightened. Gradually over time, the deformity becomes stiff and permanent. Early treatments to keep the toe straight may relieve pain. As the deformity becomes stiff and permanent, surgery may be needed to straighten the toe. What are the causes? Hammer toe is caused by abnormal bending of the toe joint that is closest to your foot. It happens gradually over time. This pulls on the muscles and connections (tendons) of the toe joint, making them weak and stiff. It is often related to wearing shoes that are too short or narrow and do not let your toes straighten. What increases the risk? You may be at greater risk for hammer toe if you:  Are female.  Are older.  Wear shoes that are too small.  Wear high-heeled shoes that pinch your toes.  Are a ballet dancer.  Have a second toe that is longer than your big toe (first toe).  Injure your foot or toe.  Have arthritis.  Have a family history of hammer toe.  Have a nerve or muscle disorder.  What are the signs or symptoms? The main symptoms of this condition are pain and deformity of the toe. The pain is worse when wearing shoes, walking, or running. Other symptoms may include:  Corns or calluses over the bent part of the toe or between the toes.  Redness and a burning feeling on the toe.  An open sore that forms on the top of the toe.  Not being able to straighten the toe.  How is this diagnosed? This condition is diagnosed based on your symptoms and a physical exam. During the exam, your health care provider will try to straighten your toe to see how stiff the deformity is. You may also have tests, such as:  A blood test to check for rheumatoid  arthritis.  An X-ray to show how severe the deformity is.  How is this treated? Treatment for this condition will depend on how stiff the deformity is. Surgery is often needed. However, sometimes a hammer toe can be straightened without surgery. Treatments that do not involve surgery include:  Taping the toe into a straightened position.  Using pads and cushions to protect the toe (orthotics).  Wearing shoes that provide enough room for the toes.  Doing toe-stretching exercises at home.  Taking an NSAID to reduce pain and swelling.  If these treatments do not help or the toe cannot be straightened, surgery is the next option. The most common surgeries used to straighten a hammer toe include:  Arthroplasty. In this procedure, part of the joint is removed, and that allows the toe to straighten.  Fusion. In this procedure, cartilage between the two bones of the joint is taken out and the bones are fused together into one longer bone.  Implantation. In this procedure, part of the bone is removed and replaced with an implant to let the toe move again.  Flexor tendon transfer. In this procedure, the tendons that curl the toes down (flexor tendons) are repositioned.  Follow these instructions at home:  Take over-the-counter and prescription medicines only as told by your health care provider.  Do toe straightening and stretching exercises as told by your health care provider.  Keep all   follow-up visits as told by your health care provider. This is important. How is this prevented?  Wear shoes that give your toes enough room and do not cause pain.  Do not wear high-heeled shoes. Contact a health care provider if:  Your pain gets worse.  Your toe becomes red or swollen.  You develop an open sore on your toe. This information is not intended to replace advice given to you by your health care provider. Make sure you discuss any questions you have with your health care  provider. Document Released: 12/23/1999 Document Revised: 07/15/2015 Document Reviewed: 04/20/2015 Elsevier Interactive Patient Education  2018 [employer]  

## 2017-03-26 NOTE — Progress Notes (Signed)
Subjective:    Patient ID: Deanna [name], female    DOB: August 19, 1926, 84 y.o.   MRN: [id number]  HPI 84 year old female presents the office today for concerns of a hammertoe of the right second toe resulting in a corn on the top of the toe.  She states the area hurts with pressure in shoes and was hurting quite a bit however she started to soak it as well as put ice to the area this is much improved.  She denies a piece of skin, but there is still a small piece present.  She denies any drainage or pus coming from the area she denies any swelling or redness to her toes.  She has no other concerns today.  No numbness or tingling.  She is on Coumadin   Review of Systems  All other systems reviewed and are negative.  Past Medical History:  Diagnosis Date  . Anxiety   . Arthritis   . Atrial fibrillation ([place])   . Barrett's esophagus   . Breast cancer ([place])   . Colon polyp 2009   TUBULAR ADENOMA  . Diverticulosis of colon (without mention of hemorrhage) 2009  . GERD (gastroesophageal reflux disease)   . Hyperlipidemia   . Hypertension   . Status post dilation of esophageal narrowing   . TRICUSPID REGURGITATION 10/23/2006    Past Surgical History:  Procedure Laterality Date  . ABDOMINAL HYSTERECTOMY    . bcc-face    . CATARACT EXTRACTION Bilateral   . MASTECTOMY Bilateral      Current Outpatient Medications:  .  acetaminophen (TYLENOL) 325 MG tablet, Take 325 mg by mouth every 6 (six) hours as needed for moderate pain. , Disp: , Rfl:  .  allopurinol (ZYLOPRIM) 300 MG tablet, Take 1 tablet by mouth  daily, Disp: 90 tablet, Rfl: 2 .  ALPRAZolam (XANAX) 0.5 MG tablet, Take 1 tablet (0.5 mg total) by mouth at bedtime as needed for anxiety. (Patient taking differently: Take 0.5 mg by mouth at bedtime. ), Disp: 90 tablet, Rfl: 0 .  amLODipine (NORVASC) 5 MG tablet, Take 1 tablet (5 mg total) by mouth daily., Disp: 90 tablet, Rfl: 3 .  Carboxymethylcellul-Glycerin (OPTIVE) 0.5-0.9 %  SOLN, Place 1 drop into both eyes daily. , Disp: , Rfl:  .  DIGOX 125 MCG tablet, TAKE 1 TABLET BY MOUTH  DAILY, Disp: 90 tablet, Rfl: 2 .  fenofibrate 160 MG tablet, TAKE 1 TABLET BY MOUTH  DAILY, Disp: 90 tablet, Rfl: 0 .  hydrALAZINE (APRESOLINE) 25 MG tablet, Take 1 tablet (25 mg total) by mouth every 8 (eight) hours., Disp: 270 tablet, Rfl: 3 .  Multiple Vitamin (MULTIVITAMIN) tablet, Take 1 tablet by mouth daily.  , Disp: , Rfl:  .  omeprazole (PRILOSEC) 20 MG capsule, Take 1 capsule (20 mg total) by mouth daily., Disp: 90 capsule, Rfl: 3 .  pravastatin (PRAVACHOL) 40 MG tablet, TAKE 1 TABLET BY MOUTH  DAILY, Disp: 90 tablet, Rfl: 2 .  spironolactone (ALDACTONE) 50 MG tablet, Take 1 tablet by mouth  daily, Disp: 90 tablet, Rfl: 2 .  traMADol (ULTRAM) 50 MG tablet, Take 1 tablet (50 mg total) by mouth every 6 (six) hours as needed. for pain, Disp: 60 tablet, Rfl: 0 .  warfarin (COUMADIN) 1 MG tablet, Take as directed by  Anticoagulation Clinic (Patient taking differently: Take 1-2 mg by mouth every morning. Takes 2mg  on Wed and Sat takes 1mg  all other days), Disp: 120 tablet, Rfl: 1  Allergies  Allergen Reactions  . Aspirin Other (See Comments)    REACTION: nervousness---tolerates ibuprofen  . Atorvastatin Other (See Comments)    myalgia    Social History   Socioeconomic History  . Marital status: Married    Spouse name: Not on file  . Number of children: 3  . Years of education: Not on file  . Highest education level: Not on file  Social Needs  . Financial resource strain: Not on file  . Food insecurity - worry: Not on file  . Food insecurity - inability: Not on file  . Transportation needs - medical: Not on file  . Transportation needs - non-medical: Not on file  Occupational History  . Occupation: retired    Fish farm manager: RETIRED  Tobacco Use  . Smoking status: Never Smoker  . Smokeless tobacco: Never Used  Substance and Sexual Activity  . Alcohol use: Yes     Alcohol/week: 2.5 oz    Types: 5 drink(s) per week  . Drug use: No  . Sexual activity: Not on file  Other Topics Concern  . Not on file  Social History Narrative  . Not on file         Objective:   Physical Exam General: AAO x3, NAD-presents with daughter  Dermatological: Small hyperkeratotic lesion dorsal right PIPJ.  No underlying ulceration, drainage or any signs of infection of the area is pre-ulcerative.  No other open lesions or pre-ulcerative lesions are identified today.  Vascular: Dorsalis Pedis artery and Posterior Tibial artery pedal pulses are 2/4 bilateral with immedate capillary fill time. There is no pain with calf compression, swelling, warmth, erythema.   Neruologic: Grossly intact via light touch bilateral.  Protective threshold with Semmes Wienstein monofilament intact to all pedal sites bilateral.   Musculoskeletal: Rigid hammertoe contracture present the right second toe.  Semirigid hammertoe contracture present on the left foot.  No other areas of tenderness other than the corn to the dorsal right second PIPJ.  Muscular strength 5/5 in all groups tested bilateral.  Gait: Unassisted, Nonantalgic.      Assessment & Plan:  84 year old female with hyperkeratotic lesion resulting from underlying hammertoe deformity -Treatment options discussed including all alternatives, risks, and complications -Etiology of symptoms were discussed -X-rays were obtained and reviewed with the patient.  Hammertoe deformities present.  There is no evidence of acute fracture or stress fracture. -Discussed with conservative as well as surgical treatment options.  I have debrided the hyperkeratotic lesion to the any complications or bleeding.  The area is pre-ulcerative but there is no underlying ulceration identified today or any signs of infection.  I dispensed various offloading pads to help take pressure off the toe.  We also discussed the change in shoes.  Trula Slade DPM

## 2017-04-02 ENCOUNTER — Ambulatory Visit: Payer: Self-pay | Admitting: Sports Medicine

## 2017-04-03 DIAGNOSIS — Z7901 Long term (current) use of anticoagulants: Secondary | ICD-10-CM | POA: Diagnosis not present

## 2017-04-03 DIAGNOSIS — Z79899 Other long term (current) drug therapy: Secondary | ICD-10-CM | POA: Diagnosis not present

## 2017-04-03 DIAGNOSIS — R7303 Prediabetes: Secondary | ICD-10-CM | POA: Diagnosis not present

## 2018-02-23 ENCOUNTER — Other Ambulatory Visit: Payer: Self-pay

## 2018-02-23 ENCOUNTER — Emergency Department (HOSPITAL_COMMUNITY): Payer: Medicare Other

## 2018-02-23 ENCOUNTER — Emergency Department (HOSPITAL_COMMUNITY)
Admission: EM | Admit: 2018-02-23 | Discharge: 2018-02-23 | Disposition: A | Discharge status: Home / Self Care | Payer: Medicare Other | Attending: Emergency Medicine | Admitting: Emergency Medicine

## 2018-02-23 ENCOUNTER — Encounter (HOSPITAL_COMMUNITY): Payer: Self-pay

## 2018-02-23 DIAGNOSIS — Z79899 Other long term (current) drug therapy: Secondary | ICD-10-CM | POA: Insufficient documentation

## 2018-02-23 DIAGNOSIS — M25551 Pain in right hip: Secondary | ICD-10-CM | POA: Diagnosis not present

## 2018-02-23 DIAGNOSIS — E785 Hyperlipidemia, unspecified: Secondary | ICD-10-CM | POA: Insufficient documentation

## 2018-02-23 DIAGNOSIS — Z7901 Long term (current) use of anticoagulants: Secondary | ICD-10-CM | POA: Diagnosis not present

## 2018-02-23 DIAGNOSIS — Z853 Personal history of malignant neoplasm of breast: Secondary | ICD-10-CM | POA: Diagnosis not present

## 2018-02-23 DIAGNOSIS — I10 Essential (primary) hypertension: Secondary | ICD-10-CM | POA: Diagnosis not present

## 2018-02-23 DIAGNOSIS — R52 Pain, unspecified: Secondary | ICD-10-CM

## 2018-02-23 DIAGNOSIS — I4891 Unspecified atrial fibrillation: Secondary | ICD-10-CM | POA: Diagnosis not present

## 2018-02-23 MED ORDER — LIDOCAINE 5 % EX PTCH: 1.0000 | MEDICATED_PATCH | CUTANEOUS | 0 refills | Status: AC

## 2018-02-23 MED ORDER — LIDOCAINE 5 % EX PTCH
1.0000 | MEDICATED_PATCH | CUTANEOUS | Status: DC
  Administered 2018-02-23: 1 via TRANSDERMAL
  Filled 2018-02-23: qty 1

## 2018-02-23 MED ORDER — DICLOFENAC SODIUM 1 % TD GEL: 2.0000 g | Freq: Four times a day (QID) | TRANSDERMAL | 0 refills | Status: AC

## 2018-02-23 MED ORDER — ACETAMINOPHEN 325 MG PO TABS
650.0000 mg | ORAL_TABLET | Freq: Once | ORAL | Status: AC
  Administered 2018-02-23: 650 mg via ORAL
  Filled 2018-02-23: qty 2

## 2018-02-23 NOTE — ED Triage Notes (Signed)
Right lateral leg pain 7/10 for 4 days.  Hip to knee.  Unable to sleep at night, worse with ambulation/weight bearing.

## 2018-02-23 NOTE — ED Provider Notes (Signed)
[facility] EMERGENCY DEPARTMENT Provider Note   CSN: [id number] Arrival date & time: 02/23/18  1112     History   Chief Complaint Chief Complaint  Patient presents with  . Leg Pain    right    HPI Deanna [name] is a 85 y.o. female.  The history is provided by the patient.  Leg Pain  Location:  Hip Injury: no   Hip location:  R hip Pain details:    Quality:  Aching and dull   Radiates to:  Does not radiate   Severity:  Moderate   Onset quality:  Gradual   Timing:  Intermittent   Progression:  Waxing and waning Chronicity:  Chronic Dislocation: no   Relieved by: tramadol. Worsened by:  Nothing Associated symptoms: no back pain, no decreased ROM, no fatigue, no fever, no itching, no muscle weakness, no neck pain, no numbness, no stiffness, no swelling and no tingling     Past Medical History:  Diagnosis Date  . Anxiety   . Arthritis   . Atrial fibrillation ([place])   . Barrett's esophagus   . Breast cancer ([place])   . Colon polyp 2009   TUBULAR ADENOMA  . Diverticulosis of colon (without mention of hemorrhage) 2009  . GERD (gastroesophageal reflux disease)   . Hyperlipidemia   . Hypertension   . Status post dilation of esophageal narrowing   . TRICUSPID REGURGITATION 10/23/2006    Patient Active Problem List   Diagnosis Date Noted  . Hypertensive urgency 09/21/2015  . Chest pain 09/20/2015  . Hypercalcemia 05/09/2015  . Encounter for therapeutic drug monitoring 02/02/2013  . Personal history of colonic polyps 04/07/2012  . Barrett's esophagus 04/07/2012  . Osteoarthritis 02/01/2012  . URI (upper respiratory infection) 01/16/2012  . Gout 12/24/2007  . TRICUSPID REGURGITATION 10/23/2006  . ATRIAL FIBRILLATION 10/15/2006  . HYPERGLYCEMIA 10/15/2006  . Hyperlipidemia 09/23/2006  . ANXIETY 09/23/2006  . Essential hypertension 09/23/2006  . GERD 09/23/2006    Past Surgical History:  Procedure Laterality Date  . ABDOMINAL HYSTERECTOMY     . bcc-face    . CATARACT EXTRACTION Bilateral   . MASTECTOMY Bilateral      OB History   No obstetric history on file.      Home Medications    Prior to Admission medications   Medication Sig Start Date End Date Taking? Authorizing Provider  acetaminophen (TYLENOL) 325 MG tablet Take 325 mg by mouth every 6 (six) hours as needed for moderate pain.     [provider]  allopurinol (ZYLOPRIM) 300 MG tablet Take 1 tablet by mouth  daily 06/20/15   Burchette, Alinda Sierras, MD  ALPRAZolam Duanne Moron) 0.5 MG tablet Take 1 tablet (0.5 mg total) by mouth at bedtime as needed for anxiety. Patient taking differently: Take 0.5 mg by mouth at bedtime.  09/02/15   Burchette, Alinda Sierras, MD  amLODipine (NORVASC) 5 MG tablet Take 1 tablet (5 mg total) by mouth daily. 10/21/15   Burchette, Alinda Sierras, MD  Carboxymethylcellul-Glycerin (OPTIVE) 0.5-0.9 % SOLN Place 1 drop into both eyes daily.     [provider]  diclofenac sodium (VOLTAREN) 1 % GEL Apply 2 g topically 4 (four) times daily for 30 days. 02/23/18 03/25/18  Lennice Sites, DO  DIGOX 125 MCG tablet TAKE 1 TABLET BY MOUTH  DAILY 05/24/15   Burchette, Alinda Sierras, MD  fenofibrate 160 MG tablet TAKE 1 TABLET BY MOUTH  DAILY 06/08/16   Burchette, Alinda Sierras, MD  hydrALAZINE (  APRESOLINE) 25 MG tablet Take 1 tablet (25 mg total) by mouth every 8 (eight) hours. 10/21/15   Burchette, Alinda Sierras, MD  lidocaine (LIDODERM) 5 % Place 1 patch onto the skin daily for 30 days. Remove & Discard patch within 12 hours or as directed by MD 02/23/18 03/25/18  Lennice Sites, DO  Multiple Vitamin (MULTIVITAMIN) tablet Take 1 tablet by mouth daily.      [provider]  omeprazole (PRILOSEC) 20 MG capsule Take 1 capsule (20 mg total) by mouth daily. 08/01/15   Burchette, Alinda Sierras, MD  pravastatin (PRAVACHOL) 40 MG tablet TAKE 1 TABLET BY MOUTH  DAILY 10/21/15   Burchette, Alinda Sierras, MD  spironolactone (ALDACTONE) 50 MG tablet Take 1 tablet by mouth  daily 05/24/15    Burchette, Alinda Sierras, MD  traMADol (ULTRAM) 50 MG tablet Take 1 tablet (50 mg total) by mouth every 6 (six) hours as needed. for pain 08/01/15   Eulas Post, MD  warfarin (COUMADIN) 1 MG tablet Take as directed by  Anticoagulation Clinic Patient taking differently: Take 1-2 mg by mouth every morning. Takes 2mg  on Wed and Sat takes 1mg  all other days 09/19/15   Eulas Post, MD    Family History Family History  Problem Relation Age of Onset  . Colon cancer Mother   . Hypertension Mother   . Colon cancer Father   . Breast cancer Other   . Colon polyps Son     Social History Social History   Tobacco Use  . Smoking status: Never Smoker  . Smokeless tobacco: Never Used  Substance Use Topics  . Alcohol use: Yes    Alcohol/week: 5.0 standard drinks    Types: 5 drink(s) per week  . Drug use: No     Allergies   Aspirin and Atorvastatin   Review of Systems Review of Systems  Constitutional: Negative for chills, fatigue and fever.  HENT: Negative for ear pain and sore throat.   Eyes: Negative for pain and visual disturbance.  Respiratory: Negative for cough and shortness of breath.   Cardiovascular: Negative for chest pain and palpitations.  Gastrointestinal: Negative for abdominal pain and vomiting.  Genitourinary: Negative for dysuria and hematuria.  Musculoskeletal: Positive for arthralgias and gait problem. Negative for back pain, neck pain and stiffness.  Skin: Negative for color change, itching and rash.  Neurological: Negative for seizures and syncope.  All other systems reviewed and are negative.    Physical Exam Updated Vital Signs  ED Triage Vitals  Enc Vitals Group     BP 02/23/18 1119 (!) 160/62     Pulse Rate 02/23/18 1119 62     Resp 02/23/18 1119 16     Temp 02/23/18 1119 97.9 F (36.6 C)     Temp Source 02/23/18 1119 Oral     SpO2 02/23/18 1119 93 %     Weight 02/23/18 1120 135 lb (61.2 kg)     Height 02/23/18 1120 5\' 4"  (1.626 m)      Head Circumference --      Peak Flow --      Pain Score 02/23/18 1120 7     Pain Loc --      Pain Edu? --      Excl. in [place]? --     Physical Exam Vitals signs and nursing note reviewed.  Constitutional:      General: She is not in acute distress.    Appearance: She is well-developed.  HENT:  Head: Normocephalic and atraumatic.  Eyes:     Extraocular Movements: Extraocular movements intact.     Conjunctiva/sclera: Conjunctivae normal.     Pupils: Pupils are equal, round, and reactive to light.  Neck:     Musculoskeletal: Neck supple.  Cardiovascular:     Rate and Rhythm: Normal rate and regular rhythm.     Pulses: Normal pulses.     Heart sounds: Normal heart sounds. No murmur.  Pulmonary:     Effort: Pulmonary effort is normal. No respiratory distress.     Breath sounds: Normal breath sounds.  Abdominal:     Palpations: Abdomen is soft.     Tenderness: There is no abdominal tenderness.  Musculoskeletal: Normal range of motion.        General: Tenderness (left hip) present. No deformity.     Right lower leg: No edema.     Left lower leg: No edema.  Skin:    General: Skin is warm and dry.  Neurological:     General: No focal deficit present.     Mental Status: She is alert and oriented to person, place, and time.     Cranial Nerves: No cranial nerve deficit.     Sensory: No sensory deficit.     Motor: No weakness.      ED Treatments / Results  Labs (all labs ordered are listed, but only abnormal results are displayed) Labs Reviewed - No data to display  EKG None  Radiology Dg Pelvis 1-2 Views  Result Date: 02/23/2018 CLINICAL DATA:  Four-day history of RIGHT LATERAL UPPER leg pain. No recent injuries. EXAM: PELVIS - 1-2 VIEW COMPARISON:  None. FINDINGS: No evidence of acute, subacute or healed fractures. Joint spaces in both hips well preserved for patient age. Bone mineral density well-preserved. Sacroiliac joints anatomically aligned with mild degenerative  changes. Moderate degenerative changes involving the symphysis pubis. Degenerative changes involving the visualized LOWER lumbar spine. IMPRESSION: No acute or subacute osseous abnormality. Degenerative changes involving the LOWER lumbar spine and the symphysis pubis. Well-preserved joint spaces in both hips. Electronically Signed   By: Evangeline Dakin M.D.   On: 02/23/2018 14:01   Dg Femur, Min 2 Views Right  Result Date: 02/23/2018 CLINICAL DATA:  Four-day history of LATERAL RIGHT UPPER leg pain. No known injuries. EXAM: RIGHT FEMUR 2 VIEWS COMPARISON:  None. FINDINGS: No acute fractures involving the femur. Bone mineral density well-preserved. Degenerative changes involving the knee joint. Femoropopliteal atherosclerosis. IMPRESSION: Normal appearing RIGHT femur. Electronically Signed   By: Evangeline Dakin M.D.   On: 02/23/2018 14:02    Procedures Procedures (including critical care time)  Medications Ordered in ED Medications  lidocaine (LIDODERM) 5 % 1 patch (1 patch Transdermal Patch Applied 02/23/18 1148)  acetaminophen (TYLENOL) tablet 650 mg (650 mg Oral Given 02/23/18 1148)     Initial Impression / Assessment and Plan / ED Course  I have reviewed the triage vital signs and the nursing notes.  Pertinent labs & imaging results that were available during my care of the patient were reviewed by me and considered in my medical decision making (see chart for details).     CHENEL WERNLI is an 85 year old female with history of high cholesterol, hypertension, atrial fibrillation who presents to the ED with right hip pain.  Patient with chronic bilateral hip pain.  Chronic lower back pain.  States that she has had some increase in right hip pain for the last several days.  That is worse with ambulation.  However, she is able to get around with her walker.  Denies any recent trauma.  Has seen specialist in the past for pain injections in the left hip but never for the right hip.  Has had  discussions about possible hip replacements as well.  Patient has no midline spinal tenderness on exam.  She is neurologically intact.  There is no swelling of the legs.  Pain is mostly in the right hip area.  X-ray showed no acute fractures or malalignment.  She does have some degenerative disc disease in the lumbar spine that was seen on pelvic x-ray.  Overall likely arthritis process.  Given lidocaine patch prescription and Voltaren prescription.  Patient already uses tramadol and walker at home.  Recommend follow-up with primary care doctor and orthopedics for further pain management.  Discharged in good condition.  This chart was dictated using voice recognition software.  Despite best efforts to proofread,  errors can occur which can change the documentation meaning.    Final Clinical Impressions(s) / ED Diagnoses   Final diagnoses:  Right hip pain    ED Discharge Orders         Ordered    lidocaine (LIDODERM) 5 %  Every 24 hours     02/23/18 1446    diclofenac sodium (VOLTAREN) 1 % GEL  4 times daily     02/23/18 1446           Martine Trageser, [place] Skye, DO 02/23/18 1503

## 2018-02-26 IMAGING — CR DG CHEST 2V
2 series · 2 of 2 positions shown · non-contrast
Comparison: 09/20/2015

CLINICAL DATA: Cough and fever

EXAM:
CHEST  2 VIEW

[w chest pa *]
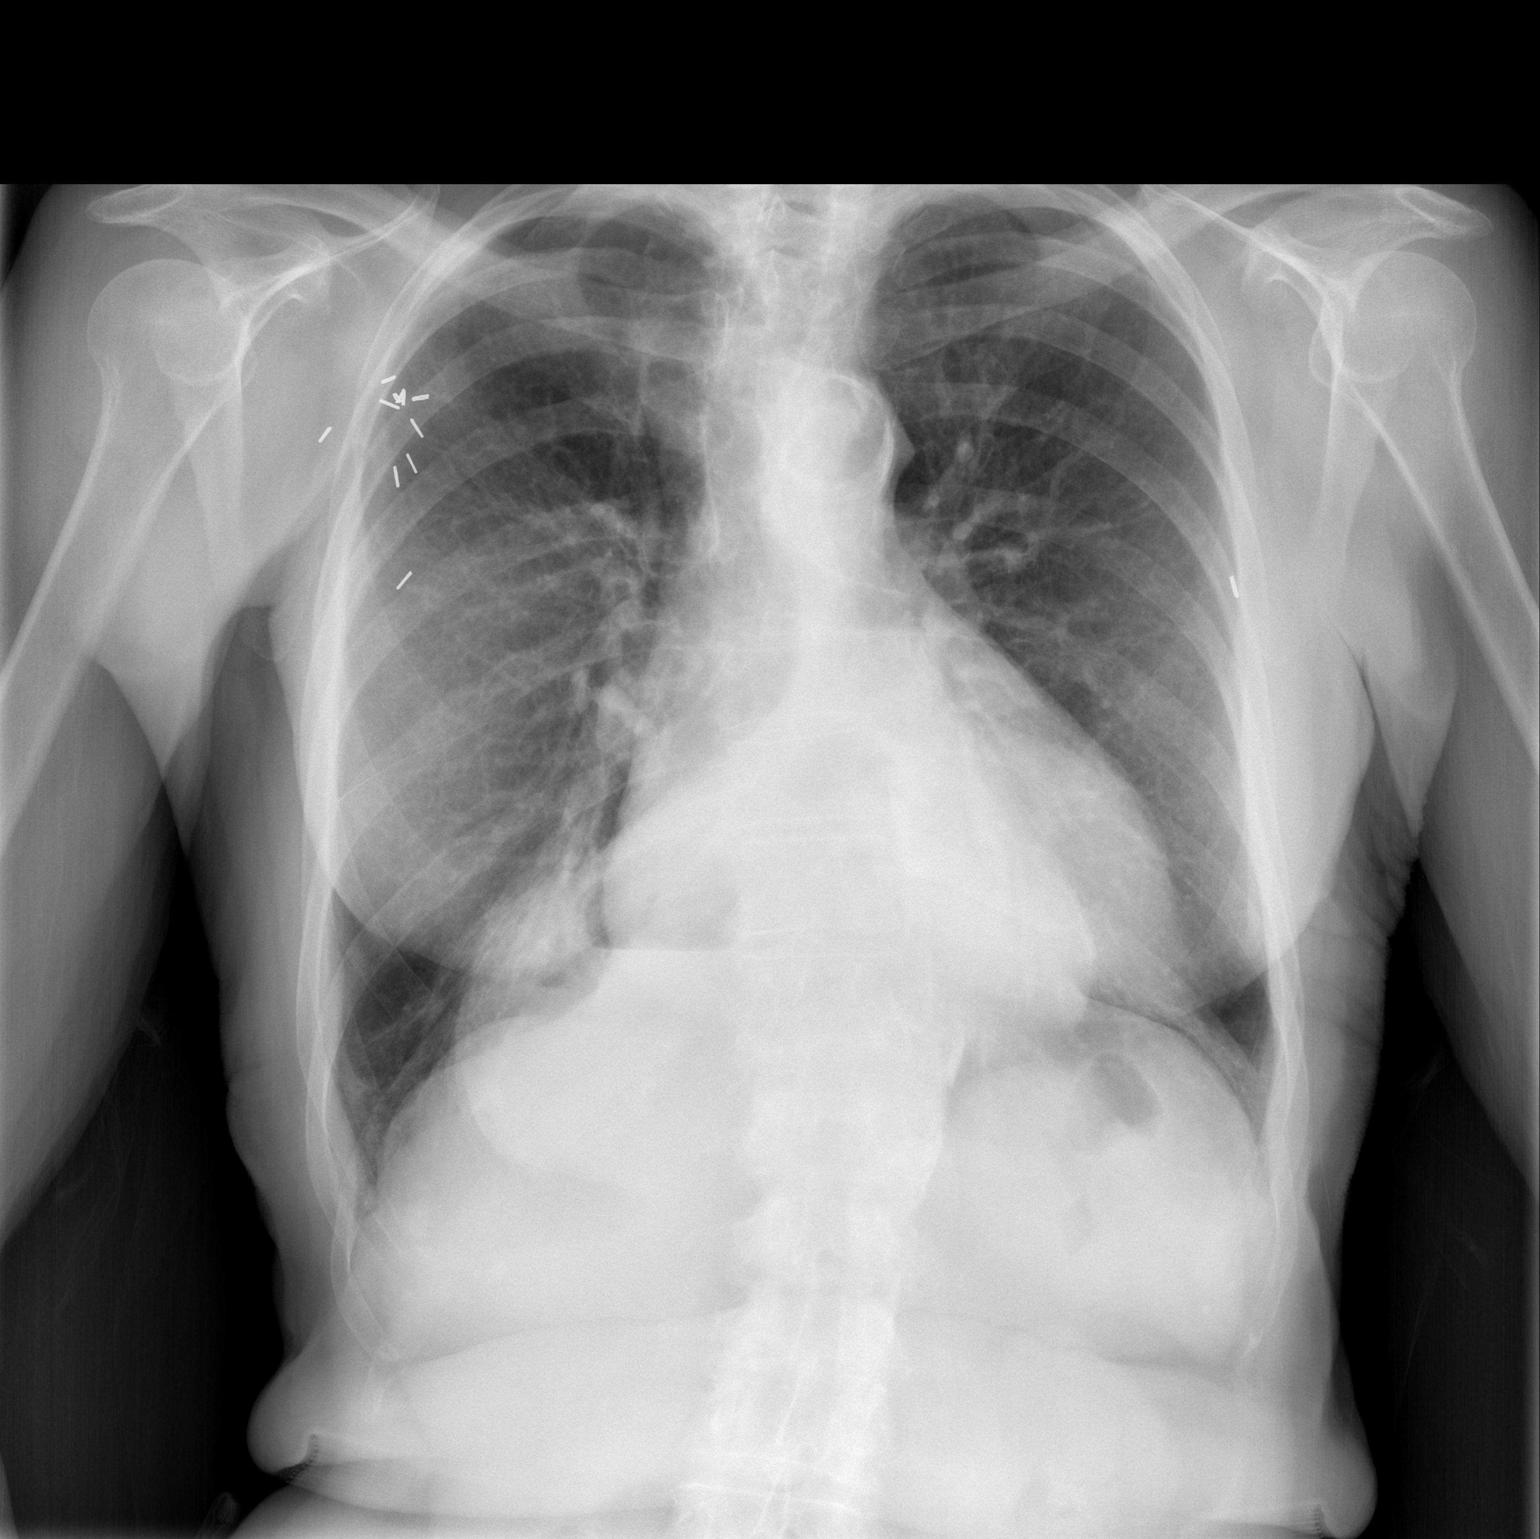

[w chest lat *]
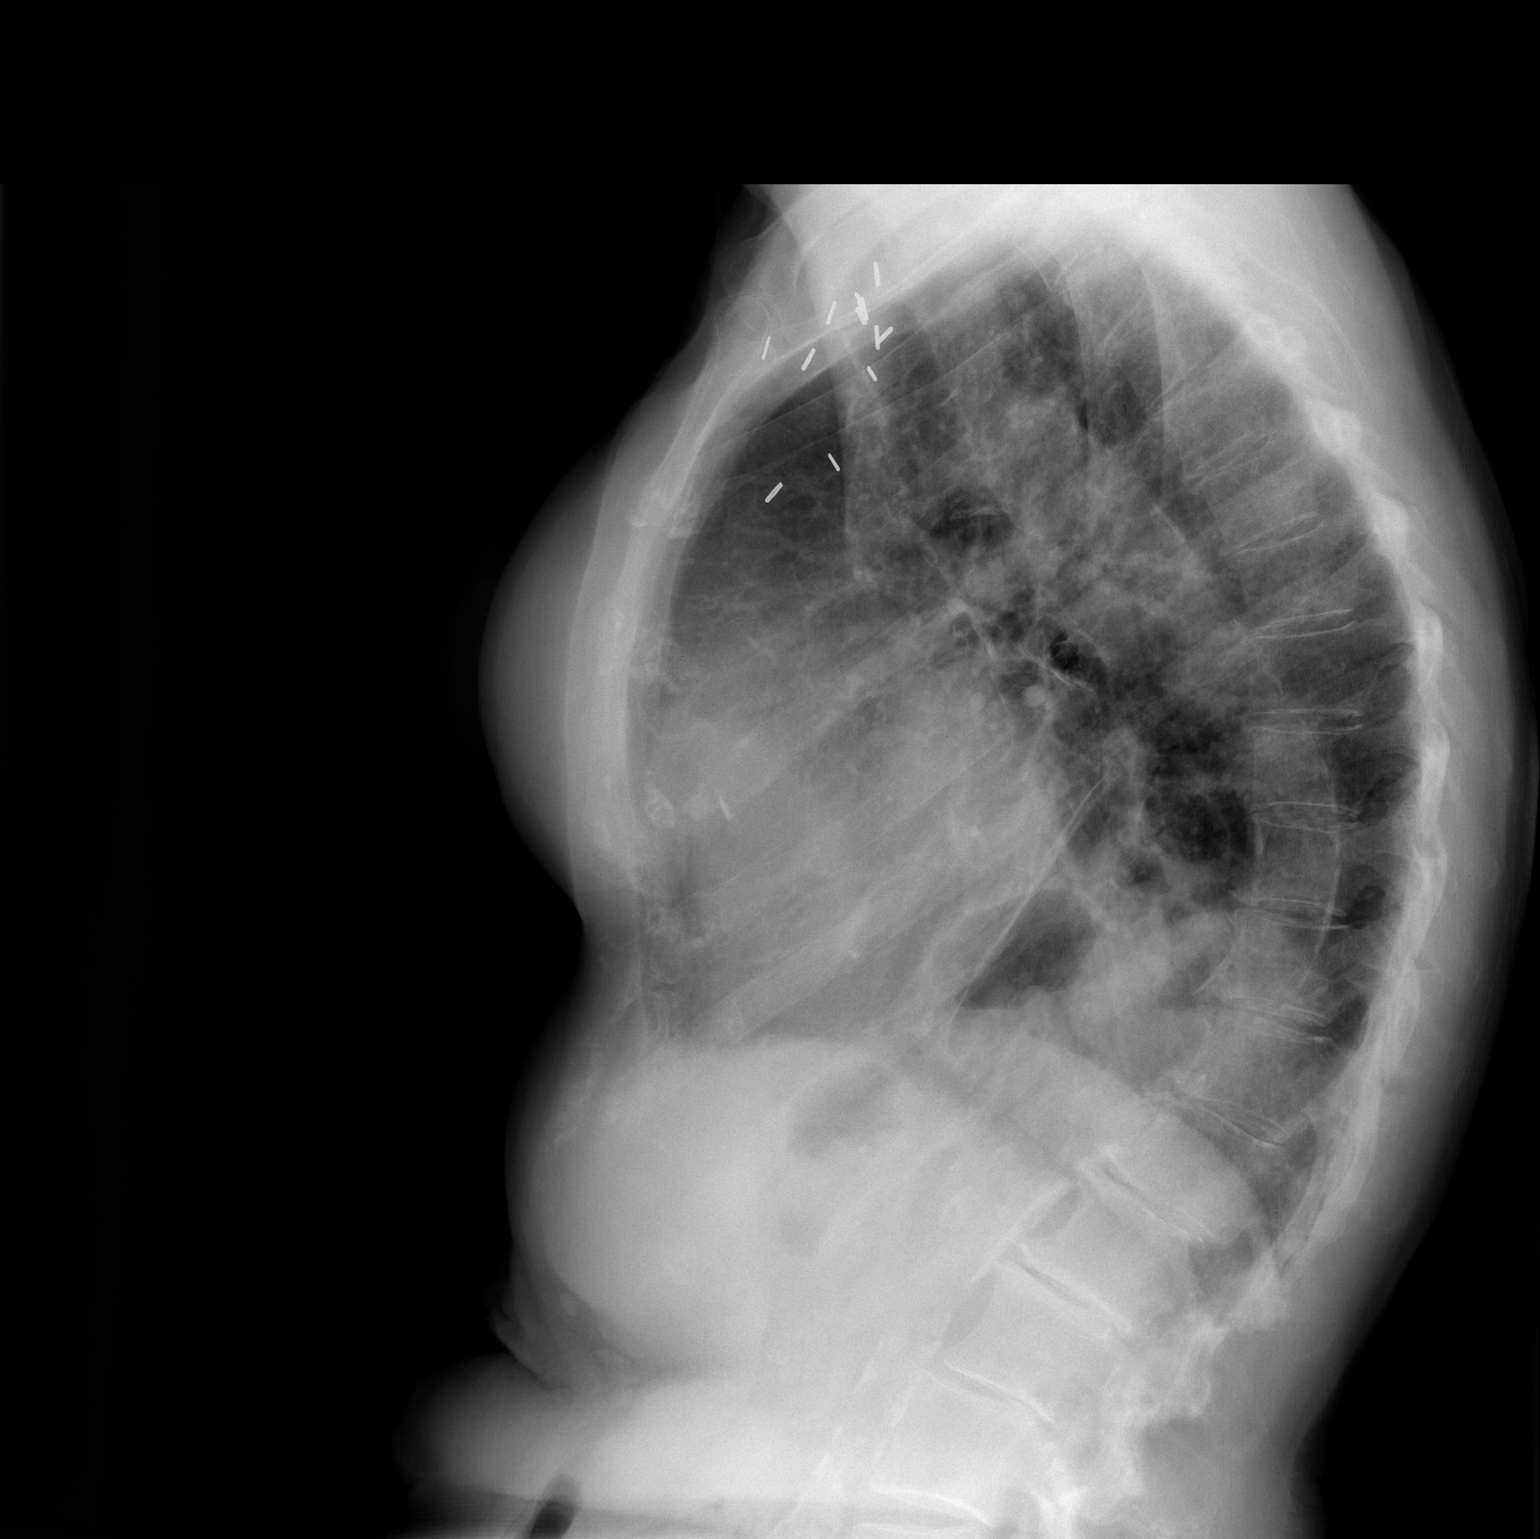

[2 of 2 positions shown; findings below may reference images not displayed]

FINDINGS: Cardiac shadow is stable. Aortic calcifications are again seen.
Large hiatal hernia is again noted and stable. Bilateral breast
implants are noted. No acute bony abnormality is seen. No focal
infiltrate is noted. Postsurgical changes are again seen.
IMPRESSION: No active cardiopulmonary disease.

These results will be called to the ordering clinician or
representative by the [HOSPITAL] at the imaging location.

## 2018-06-09 ENCOUNTER — Encounter (HOSPITAL_COMMUNITY): Payer: Self-pay | Admitting: Emergency Medicine

## 2018-06-09 ENCOUNTER — Emergency Department (HOSPITAL_COMMUNITY)
Admission: EM | Admit: 2018-06-09 | Discharge: 2018-06-09 | Disposition: A | Discharge status: Home / Self Care | Payer: Medicare Other | Attending: Emergency Medicine | Admitting: Emergency Medicine

## 2018-06-09 DIAGNOSIS — Z7901 Long term (current) use of anticoagulants: Secondary | ICD-10-CM | POA: Diagnosis not present

## 2018-06-09 DIAGNOSIS — I10 Essential (primary) hypertension: Secondary | ICD-10-CM | POA: Diagnosis present

## 2018-06-09 DIAGNOSIS — R791 Abnormal coagulation profile: Secondary | ICD-10-CM | POA: Diagnosis not present

## 2018-06-09 DIAGNOSIS — F419 Anxiety disorder, unspecified: Secondary | ICD-10-CM

## 2018-06-09 DIAGNOSIS — Z79899 Other long term (current) drug therapy: Secondary | ICD-10-CM | POA: Insufficient documentation

## 2018-06-09 LAB — BASIC METABOLIC PANEL
Anion gap: 12 (ref 5–15)
BUN: 19 mg/dL (ref 8–23)
CO2: 25 mmol/L (ref 22–32)
Calcium: 10.8 mg/dL — ABNORMAL HIGH (ref 8.9–10.3)
Chloride: 100 mmol/L (ref 98–111)
Creatinine, Ser: 1.13 mg/dL — ABNORMAL HIGH (ref 0.44–1.00)
GFR calc Af Amer: 50 mL/min — ABNORMAL LOW (ref >60)
GFR calc non Af Amer: 43 mL/min — ABNORMAL LOW (ref >60)
Glucose, Bld: 180 mg/dL — ABNORMAL HIGH (ref 70–99)
Potassium: 4.3 mmol/L (ref 3.5–5.1)
Sodium: 137 mmol/L (ref 135–145)

## 2018-06-09 LAB — URINALYSIS, ROUTINE W REFLEX MICROSCOPIC
Bacteria, UA: NONE SEEN
Bilirubin Urine: NEGATIVE
Glucose, UA: NEGATIVE mg/dL
Hgb urine dipstick: NEGATIVE
Ketones, ur: NEGATIVE mg/dL
Leukocytes,Ua: NEGATIVE
Nitrite: NEGATIVE
Protein, ur: 100 mg/dL — AB
Specific Gravity, Urine: 1.01 (ref 1.005–1.030)
pH: 6 (ref 5.0–8.0)

## 2018-06-09 LAB — CBC
HCT: 47.2 % — ABNORMAL HIGH (ref 36.0–46.0)
Hemoglobin: 15.3 g/dL — ABNORMAL HIGH (ref 12.0–15.0)
MCH: 30.4 pg (ref 26.0–34.0)
MCHC: 32.4 g/dL (ref 30.0–36.0)
MCV: 93.7 fL (ref 80.0–100.0)
Platelets: 282 10*3/uL (ref 150–400)
RBC: 5.04 MIL/uL (ref 3.87–5.11)
RDW: 14 % (ref 11.5–15.5)
WBC: 6.9 10*3/uL (ref 4.0–10.5)
nRBC: 0 % (ref 0.0–0.2)

## 2018-06-09 LAB — PROTIME-INR
INR: 1.6 — ABNORMAL HIGH (ref 0.8–1.2)
Prothrombin Time: 19.2 seconds — ABNORMAL HIGH (ref 11.4–15.2)

## 2018-06-09 LAB — DIGOXIN LEVEL: Digoxin Level: 1 ng/mL (ref 0.8–2.0)

## 2018-06-09 MED ORDER — ALPRAZOLAM 0.25 MG PO TABS
0.5000 mg | ORAL_TABLET | Freq: Once | ORAL | Status: AC
  Administered 2018-06-09: 0.5 mg via ORAL
  Filled 2018-06-09: qty 2

## 2018-06-09 MED ORDER — ALPRAZOLAM 0.5 MG PO TABS: 0.5000 mg | ORAL_TABLET | Freq: Every evening | ORAL | 0 refills | Status: DC | PRN

## 2018-06-09 MED ORDER — WARFARIN SODIUM 4 MG PO TABS
4.0000 mg | ORAL_TABLET | Freq: Once | ORAL | Status: AC
  Administered 2018-06-09: 07:00:00 4 mg via ORAL
  Filled 2018-06-09: qty 1

## 2018-06-09 MED ORDER — SODIUM CHLORIDE 0.9% FLUSH: 3.0000 mL | Freq: Once | INTRAVENOUS | Status: DC

## 2018-06-09 NOTE — ED Provider Notes (Signed)
[facility] Provider Note   CSN: [id number] Arrival date & time: 06/09/18  0038    History   Chief Complaint Chief Complaint  Patient presents with  . Hypertension    HPI Deanna [name] is a 85 y.o. female.     The history is provided by the patient.  Anxiety  This is a new problem. The current episode started more than 2 days ago. The problem occurs daily. The problem has been gradually worsening. Pertinent negatives include no chest pain, no abdominal pain, no headaches and no shortness of breath. Nothing aggravates the symptoms. Nothing relieves the symptoms.  Patient history of atrial fibrillation, anxiety presents with feeling anxious and elevated blood pressure. She reports she is been feeling anxious for the past 4 days that she has been out of her Xanax.  She feels very shaky.  No chest pain/shortness of breath.  No focal weakness.  She is also very concerned about her elevated blood pressure.  She reports she lives in a facility, and she is awaiting her Xanax by mail.  Past Medical History:  Diagnosis Date  . Anxiety   . Arthritis   . Atrial fibrillation ([place])   . Barrett's esophagus   . Breast cancer ([place])   . Colon polyp 2009   TUBULAR ADENOMA  . Diverticulosis of colon (without mention of hemorrhage) 2009  . GERD (gastroesophageal reflux disease)   . Hyperlipidemia   . Hypertension   . Status post dilation of esophageal narrowing   . TRICUSPID REGURGITATION 10/23/2006    Patient Active Problem List   Diagnosis Date Noted  . Hypertensive urgency 09/21/2015  . Chest pain 09/20/2015  . Hypercalcemia 05/09/2015  . Encounter for therapeutic drug monitoring 02/02/2013  . Personal history of colonic polyps 04/07/2012  . Barrett's esophagus 04/07/2012  . Osteoarthritis 02/01/2012  . URI (upper respiratory infection) 01/16/2012  . Gout 12/24/2007  . TRICUSPID REGURGITATION 10/23/2006  . ATRIAL FIBRILLATION 10/15/2006  .  HYPERGLYCEMIA 10/15/2006  . Hyperlipidemia 09/23/2006  . ANXIETY 09/23/2006  . Essential hypertension 09/23/2006  . GERD 09/23/2006    Past Surgical History:  Procedure Laterality Date  . ABDOMINAL HYSTERECTOMY    . bcc-face    . CATARACT EXTRACTION Bilateral   . MASTECTOMY Bilateral      OB History   No obstetric history on file.      Home Medications    Prior to Admission medications   Medication Sig Start Date End Date Taking? Authorizing Provider  acetaminophen (TYLENOL) 325 MG tablet Take 325 mg by mouth every 6 (six) hours as needed for moderate pain.     [provider]  allopurinol (ZYLOPRIM) 300 MG tablet Take 1 tablet by mouth  daily 06/20/15   Burchette, Alinda Sierras, MD  ALPRAZolam Duanne Moron) 0.5 MG tablet Take 1 tablet (0.5 mg total) by mouth at bedtime as needed for anxiety. Patient taking differently: Take 0.5 mg by mouth at bedtime.  09/02/15   Burchette, Alinda Sierras, MD  amLODipine (NORVASC) 5 MG tablet Take 1 tablet (5 mg total) by mouth daily. 10/21/15   Burchette, Alinda Sierras, MD  Carboxymethylcellul-Glycerin (OPTIVE) 0.5-0.9 % SOLN Place 1 drop into both eyes daily.     [provider]  [place] 125 MCG tablet TAKE 1 TABLET BY MOUTH  DAILY 05/24/15   Burchette, Alinda Sierras, MD  fenofibrate 160 MG tablet TAKE 1 TABLET BY MOUTH  DAILY 06/08/16   Burchette, Alinda Sierras, MD  hydrALAZINE (APRESOLINE) 25  MG tablet Take 1 tablet (25 mg total) by mouth every 8 (eight) hours. 10/21/15   Burchette, Alinda Sierras, MD  Multiple Vitamin (MULTIVITAMIN) tablet Take 1 tablet by mouth daily.      [provider]  omeprazole (PRILOSEC) 20 MG capsule Take 1 capsule (20 mg total) by mouth daily. 08/01/15   Burchette, Alinda Sierras, MD  pravastatin (PRAVACHOL) 40 MG tablet TAKE 1 TABLET BY MOUTH  DAILY 10/21/15   Burchette, Alinda Sierras, MD  spironolactone (ALDACTONE) 50 MG tablet Take 1 tablet by mouth  daily 05/24/15   Burchette, Alinda Sierras, MD  traMADol (ULTRAM) 50 MG tablet Take 1 tablet (50 mg total)  by mouth every 6 (six) hours as needed. for pain 08/01/15   Eulas Post, MD  warfarin (COUMADIN) 1 MG tablet Take as directed by  Anticoagulation Clinic Patient taking differently: Take 1-2 mg by mouth every morning. Takes 2mg  on Wed and Sat takes 1mg  all other days 09/19/15   Eulas Post, MD    Family History Family History  Problem Relation Age of Onset  . Colon cancer Mother   . Hypertension Mother   . Colon cancer Father   . Breast cancer Other   . Colon polyps Son     Social History Social History   Tobacco Use  . Smoking status: Never Smoker  . Smokeless tobacco: Never Used  Substance Use Topics  . Alcohol use: Yes    Alcohol/week: 5.0 standard drinks    Types: 5 drink(s) per week  . Drug use: No     Allergies   Aspirin and Atorvastatin   Review of Systems Review of Systems  Constitutional: Negative for fever.  Respiratory: Negative for shortness of breath.   Cardiovascular: Negative for chest pain.  Gastrointestinal: Negative for abdominal pain.  Neurological: Negative for headaches.  Psychiatric/Behavioral: The patient is nervous/anxious.   All other systems reviewed and are negative.    Physical Exam Updated Vital Signs BP (!) 197/72   Pulse (!) 59   Temp 97.8 F (36.6 C) (Oral)   Resp 17   Ht 1.626 m (5\' 4" )   Wt 61.2 kg   SpO2 96%   BMI 23.17 kg/m   Physical Exam CONSTITUTIONAL: Elderly, anxious HEAD: Normocephalic/atraumatic EYES: EOMI/PERRL ENMT: Mucous membranes moist NECK: supple no meningeal signs SPINE/BACK:entire spine nontender CV: S1/S2 noted LUNGS: Lungs are clear to auscultation bilaterally, no apparent distress ABDOMEN: soft, nontender, no rebound or guarding, bowel sounds noted throughout abdomen GU:no cva tenderness NEURO: Pt is awake/alert/appropriate, moves all extremitiesx4.  No facial droop.  Mild tremor noted, no arm or leg drift EXTREMITIES: pulses normal/equal, full ROM SKIN: warm, color normal PSYCH:  Anxious   ED Treatments / Results  Labs (all labs ordered are listed, but only abnormal results are displayed) Labs Reviewed  BASIC METABOLIC PANEL - Abnormal; Notable for the following components:      Result Value   Glucose, Bld 180 (*)    Creatinine, Ser 1.13 (*)    Calcium 10.8 (*)    GFR calc non Af Amer 43 (*)    GFR calc Af Amer 50 (*)    All other components within normal limits  CBC - Abnormal; Notable for the following components:   Hemoglobin 15.3 (*)    HCT 47.2 (*)    All other components within normal limits  URINALYSIS, ROUTINE W REFLEX MICROSCOPIC - Abnormal; Notable for the following components:   Protein, ur 100 (*)    All  other components within normal limits  PROTIME-INR - Abnormal; Notable for the following components:   Prothrombin Time 19.2 (*)    INR 1.6 (*)    All other components within normal limits  DIGOXIN LEVEL    EKG EKG Interpretation  Date/Time:  Monday June 09 2018 05:44:17 EDT Ventricular Rate:  54 PR Interval:    QRS Duration: 105 QT Interval:  384 QTC Calculation: 364 R Axis:   89 Text Interpretation:  Atrial fibrillation Borderline right axis deviation No significant change since last tracing Confirmed by Ripley Fraise [id number]) on 06/09/2018 5:47:26 AM   Radiology No results found.  Procedures Procedures    Medications Ordered in ED Medications  sodium chloride flush (NS) 0.9 % injection 3 mL (has no administration in time range)  warfarin (COUMADIN) tablet 4 mg (has no administration in time range)  ALPRAZolam (XANAX) tablet 0.5 mg (0.5 mg Oral Given 06/09/18 0537)     Initial Impression / Assessment and Plan / ED Course  I have reviewed the triage vital signs and the nursing notes.  Pertinent labs  results that were available during my care of the patient were reviewed by me and considered in my medical decision making (see chart for details).        5:47 AM Patient presents for feeling anxious after not having  her Xanax for up to 4 days.  She appears tremulous and mildly anxious. We will give her Xanax here.  Labs are pending this time.  No focal weakness.  She denies any chest pain/shortness of breath. 6:58 AM Patient appears improved. Digoxin is therapeutic.  Coumadin is subtherapeutic.  Discussed with pharmacy who recommends a one-time dose of Coumadin 4 mg, she can follow-up continue her home medicine  Patient is now requesting discharge home. Due to concern for BDZ withdrawal, I will prescribe a short course of Xanax until her mail order arrives this was delayed. She does appear improved with Xanax, less tremulous and less anxious.  Blood pressures improving. She can ambulate, but usually needs a walker Final Clinical Impressions(s) / ED Diagnoses   Final diagnoses:  Subtherapeutic international normalized ratio (INR)  Anxiety    ED Discharge Orders         Ordered    ALPRAZolam (XANAX) 0.5 MG tablet  At bedtime PRN     06/09/18 0657           Ripley Fraise, MD 06/09/18 [phone]

## 2018-06-09 NOTE — ED Triage Notes (Signed)
Pt arrives from home via [facility] with c/o of hypertension- pt has been out of valium for 4 days causing her to be more anxious. Pts bp with ems was 192/76 HR 88- been taking all BP medication.  Pt feels "jittery and anxious" denies any pain

## 2018-06-09 NOTE — Discharge Instructions (Addendum)

## 2018-06-09 NOTE — ED Notes (Signed)
Pt unable to void at this time. 

## 2018-06-09 NOTE — ED Notes (Signed)
Pt tolerated ambulating to BR

## 2018-06-09 NOTE — ED Notes (Addendum)
Walked pt back from bathroom, she was slightly aggitated stating she wanted to go. Pt did not want to sit down or sit in the bed.  RN attempted to have pt wait in the wheelchair however she refused.  She states she uses a walker however refused one here.   RN finally convinced pt to sit in chair.

## 2018-10-17 ENCOUNTER — Other Ambulatory Visit: Payer: Self-pay | Admitting: Geriatric Medicine

## 2018-10-17 DIAGNOSIS — R1909 Other intra-abdominal and pelvic swelling, mass and lump: Secondary | ICD-10-CM

## 2018-10-24 ENCOUNTER — Ambulatory Visit
Admission: RE | Admit: 2018-10-24 | Discharge: 2018-10-24 | Disposition: A | Discharge status: Home / Self Care | Payer: Medicare Other | Source: Ambulatory Visit | Attending: Geriatric Medicine | Admitting: Geriatric Medicine

## 2018-10-24 DIAGNOSIS — R1909 Other intra-abdominal and pelvic swelling, mass and lump: Secondary | ICD-10-CM

## 2018-10-24 MED ORDER — IOPAMIDOL (ISOVUE-300) INJECTION 61%
100.0000 mL | Freq: Once | INTRAVENOUS | Status: AC | PRN
  Administered 2018-10-24: 100 mL via INTRAVENOUS

## 2018-11-01 ENCOUNTER — Encounter (INDEPENDENT_AMBULATORY_CARE_PROVIDER_SITE_OTHER): Payer: Self-pay

## 2018-11-08 ENCOUNTER — Other Ambulatory Visit: Payer: Self-pay

## 2018-11-08 DIAGNOSIS — Z20822 Contact with and (suspected) exposure to covid-19: Secondary | ICD-10-CM

## 2018-11-09 LAB — NOVEL CORONAVIRUS, NAA: SARS-CoV-2, NAA: NOT DETECTED

## 2020-01-13 DIAGNOSIS — N1832 Chronic kidney disease, stage 3b: Secondary | ICD-10-CM | POA: Diagnosis not present

## 2020-01-13 DIAGNOSIS — I482 Chronic atrial fibrillation, unspecified: Secondary | ICD-10-CM | POA: Diagnosis not present

## 2020-01-13 DIAGNOSIS — I8310 Varicose veins of unspecified lower extremity with inflammation: Secondary | ICD-10-CM | POA: Diagnosis not present

## 2020-01-13 DIAGNOSIS — Z7901 Long term (current) use of anticoagulants: Secondary | ICD-10-CM | POA: Diagnosis not present

## 2020-01-13 DIAGNOSIS — I83009 Varicose veins of unspecified lower extremity with ulcer of unspecified site: Secondary | ICD-10-CM | POA: Diagnosis not present

## 2020-01-13 DIAGNOSIS — I129 Hypertensive chronic kidney disease with stage 1 through stage 4 chronic kidney disease, or unspecified chronic kidney disease: Secondary | ICD-10-CM | POA: Diagnosis not present

## 2020-01-15 DIAGNOSIS — L97829 Non-pressure chronic ulcer of other part of left lower leg with unspecified severity: Secondary | ICD-10-CM | POA: Diagnosis not present

## 2020-01-15 DIAGNOSIS — Z7901 Long term (current) use of anticoagulants: Secondary | ICD-10-CM | POA: Diagnosis not present

## 2020-01-15 DIAGNOSIS — K219 Gastro-esophageal reflux disease without esophagitis: Secondary | ICD-10-CM | POA: Diagnosis not present

## 2020-01-15 DIAGNOSIS — Z4800 Encounter for change or removal of nonsurgical wound dressing: Secondary | ICD-10-CM | POA: Diagnosis not present

## 2020-01-15 DIAGNOSIS — N1832 Chronic kidney disease, stage 3b: Secondary | ICD-10-CM | POA: Diagnosis not present

## 2020-01-15 DIAGNOSIS — I129 Hypertensive chronic kidney disease with stage 1 through stage 4 chronic kidney disease, or unspecified chronic kidney disease: Secondary | ICD-10-CM | POA: Diagnosis not present

## 2020-01-15 DIAGNOSIS — E1122 Type 2 diabetes mellitus with diabetic chronic kidney disease: Secondary | ICD-10-CM | POA: Diagnosis not present

## 2020-01-15 DIAGNOSIS — L97818 Non-pressure chronic ulcer of other part of right lower leg with other specified severity: Secondary | ICD-10-CM | POA: Diagnosis not present

## 2020-01-15 DIAGNOSIS — I872 Venous insufficiency (chronic) (peripheral): Secondary | ICD-10-CM | POA: Diagnosis not present

## 2020-01-15 DIAGNOSIS — I482 Chronic atrial fibrillation, unspecified: Secondary | ICD-10-CM | POA: Diagnosis not present

## 2020-01-19 DIAGNOSIS — L97829 Non-pressure chronic ulcer of other part of left lower leg with unspecified severity: Secondary | ICD-10-CM | POA: Diagnosis not present

## 2020-01-20 DIAGNOSIS — Z7901 Long term (current) use of anticoagulants: Secondary | ICD-10-CM | POA: Diagnosis not present

## 2020-01-20 DIAGNOSIS — Z4800 Encounter for change or removal of nonsurgical wound dressing: Secondary | ICD-10-CM | POA: Diagnosis not present

## 2020-01-20 DIAGNOSIS — I872 Venous insufficiency (chronic) (peripheral): Secondary | ICD-10-CM | POA: Diagnosis not present

## 2020-01-20 DIAGNOSIS — I482 Chronic atrial fibrillation, unspecified: Secondary | ICD-10-CM | POA: Diagnosis not present

## 2020-01-20 DIAGNOSIS — L97818 Non-pressure chronic ulcer of other part of right lower leg with other specified severity: Secondary | ICD-10-CM | POA: Diagnosis not present

## 2020-01-20 DIAGNOSIS — N1832 Chronic kidney disease, stage 3b: Secondary | ICD-10-CM | POA: Diagnosis not present

## 2020-01-20 DIAGNOSIS — K219 Gastro-esophageal reflux disease without esophagitis: Secondary | ICD-10-CM | POA: Diagnosis not present

## 2020-01-20 DIAGNOSIS — I129 Hypertensive chronic kidney disease with stage 1 through stage 4 chronic kidney disease, or unspecified chronic kidney disease: Secondary | ICD-10-CM | POA: Diagnosis not present

## 2020-01-20 DIAGNOSIS — L97829 Non-pressure chronic ulcer of other part of left lower leg with unspecified severity: Secondary | ICD-10-CM | POA: Diagnosis not present

## 2020-01-20 DIAGNOSIS — E1122 Type 2 diabetes mellitus with diabetic chronic kidney disease: Secondary | ICD-10-CM | POA: Diagnosis not present

## 2020-01-22 DIAGNOSIS — Z4800 Encounter for change or removal of nonsurgical wound dressing: Secondary | ICD-10-CM | POA: Diagnosis not present

## 2020-01-22 DIAGNOSIS — I482 Chronic atrial fibrillation, unspecified: Secondary | ICD-10-CM | POA: Diagnosis not present

## 2020-01-22 DIAGNOSIS — L97829 Non-pressure chronic ulcer of other part of left lower leg with unspecified severity: Secondary | ICD-10-CM | POA: Diagnosis not present

## 2020-01-22 DIAGNOSIS — I129 Hypertensive chronic kidney disease with stage 1 through stage 4 chronic kidney disease, or unspecified chronic kidney disease: Secondary | ICD-10-CM | POA: Diagnosis not present

## 2020-01-22 DIAGNOSIS — K219 Gastro-esophageal reflux disease without esophagitis: Secondary | ICD-10-CM | POA: Diagnosis not present

## 2020-01-22 DIAGNOSIS — L97818 Non-pressure chronic ulcer of other part of right lower leg with other specified severity: Secondary | ICD-10-CM | POA: Diagnosis not present

## 2020-01-22 DIAGNOSIS — N1832 Chronic kidney disease, stage 3b: Secondary | ICD-10-CM | POA: Diagnosis not present

## 2020-01-22 DIAGNOSIS — I872 Venous insufficiency (chronic) (peripheral): Secondary | ICD-10-CM | POA: Diagnosis not present

## 2020-01-22 DIAGNOSIS — Z7901 Long term (current) use of anticoagulants: Secondary | ICD-10-CM | POA: Diagnosis not present

## 2020-01-22 DIAGNOSIS — E1122 Type 2 diabetes mellitus with diabetic chronic kidney disease: Secondary | ICD-10-CM | POA: Diagnosis not present

## 2020-01-26 DIAGNOSIS — Z7901 Long term (current) use of anticoagulants: Secondary | ICD-10-CM | POA: Diagnosis not present

## 2020-01-26 DIAGNOSIS — Z4800 Encounter for change or removal of nonsurgical wound dressing: Secondary | ICD-10-CM | POA: Diagnosis not present

## 2020-01-26 DIAGNOSIS — K219 Gastro-esophageal reflux disease without esophagitis: Secondary | ICD-10-CM | POA: Diagnosis not present

## 2020-01-26 DIAGNOSIS — I482 Chronic atrial fibrillation, unspecified: Secondary | ICD-10-CM | POA: Diagnosis not present

## 2020-01-26 DIAGNOSIS — I129 Hypertensive chronic kidney disease with stage 1 through stage 4 chronic kidney disease, or unspecified chronic kidney disease: Secondary | ICD-10-CM | POA: Diagnosis not present

## 2020-01-26 DIAGNOSIS — L97829 Non-pressure chronic ulcer of other part of left lower leg with unspecified severity: Secondary | ICD-10-CM | POA: Diagnosis not present

## 2020-01-26 DIAGNOSIS — L97818 Non-pressure chronic ulcer of other part of right lower leg with other specified severity: Secondary | ICD-10-CM | POA: Diagnosis not present

## 2020-01-26 DIAGNOSIS — I872 Venous insufficiency (chronic) (peripheral): Secondary | ICD-10-CM | POA: Diagnosis not present

## 2020-01-26 DIAGNOSIS — N1832 Chronic kidney disease, stage 3b: Secondary | ICD-10-CM | POA: Diagnosis not present

## 2020-01-26 DIAGNOSIS — E1122 Type 2 diabetes mellitus with diabetic chronic kidney disease: Secondary | ICD-10-CM | POA: Diagnosis not present

## 2020-01-27 DIAGNOSIS — L218 Other seborrheic dermatitis: Secondary | ICD-10-CM | POA: Diagnosis not present

## 2020-01-27 DIAGNOSIS — D0461 Carcinoma in situ of skin of right upper limb, including shoulder: Secondary | ICD-10-CM | POA: Diagnosis not present

## 2020-01-27 DIAGNOSIS — L570 Actinic keratosis: Secondary | ICD-10-CM | POA: Diagnosis not present

## 2020-01-27 DIAGNOSIS — Z7901 Long term (current) use of anticoagulants: Secondary | ICD-10-CM | POA: Diagnosis not present

## 2020-01-27 DIAGNOSIS — Z85828 Personal history of other malignant neoplasm of skin: Secondary | ICD-10-CM | POA: Diagnosis not present

## 2020-01-27 DIAGNOSIS — D485 Neoplasm of uncertain behavior of skin: Secondary | ICD-10-CM | POA: Diagnosis not present

## 2020-01-27 DIAGNOSIS — L814 Other melanin hyperpigmentation: Secondary | ICD-10-CM | POA: Diagnosis not present

## 2020-01-27 DIAGNOSIS — L821 Other seborrheic keratosis: Secondary | ICD-10-CM | POA: Diagnosis not present

## 2020-01-27 DIAGNOSIS — D225 Melanocytic nevi of trunk: Secondary | ICD-10-CM | POA: Diagnosis not present

## 2020-01-29 DIAGNOSIS — Z7901 Long term (current) use of anticoagulants: Secondary | ICD-10-CM | POA: Diagnosis not present

## 2020-01-29 DIAGNOSIS — L97829 Non-pressure chronic ulcer of other part of left lower leg with unspecified severity: Secondary | ICD-10-CM | POA: Diagnosis not present

## 2020-01-29 DIAGNOSIS — I482 Chronic atrial fibrillation, unspecified: Secondary | ICD-10-CM | POA: Diagnosis not present

## 2020-01-29 DIAGNOSIS — I129 Hypertensive chronic kidney disease with stage 1 through stage 4 chronic kidney disease, or unspecified chronic kidney disease: Secondary | ICD-10-CM | POA: Diagnosis not present

## 2020-01-29 DIAGNOSIS — K219 Gastro-esophageal reflux disease without esophagitis: Secondary | ICD-10-CM | POA: Diagnosis not present

## 2020-01-29 DIAGNOSIS — Z4800 Encounter for change or removal of nonsurgical wound dressing: Secondary | ICD-10-CM | POA: Diagnosis not present

## 2020-01-29 DIAGNOSIS — L97818 Non-pressure chronic ulcer of other part of right lower leg with other specified severity: Secondary | ICD-10-CM | POA: Diagnosis not present

## 2020-01-29 DIAGNOSIS — I872 Venous insufficiency (chronic) (peripheral): Secondary | ICD-10-CM | POA: Diagnosis not present

## 2020-01-29 DIAGNOSIS — N1832 Chronic kidney disease, stage 3b: Secondary | ICD-10-CM | POA: Diagnosis not present

## 2020-01-29 DIAGNOSIS — E1122 Type 2 diabetes mellitus with diabetic chronic kidney disease: Secondary | ICD-10-CM | POA: Diagnosis not present

## 2020-02-01 DIAGNOSIS — L97829 Non-pressure chronic ulcer of other part of left lower leg with unspecified severity: Secondary | ICD-10-CM | POA: Diagnosis not present

## 2020-02-01 DIAGNOSIS — I872 Venous insufficiency (chronic) (peripheral): Secondary | ICD-10-CM | POA: Diagnosis not present

## 2020-02-01 DIAGNOSIS — N1832 Chronic kidney disease, stage 3b: Secondary | ICD-10-CM | POA: Diagnosis not present

## 2020-02-01 DIAGNOSIS — I482 Chronic atrial fibrillation, unspecified: Secondary | ICD-10-CM | POA: Diagnosis not present

## 2020-02-01 DIAGNOSIS — Z7901 Long term (current) use of anticoagulants: Secondary | ICD-10-CM | POA: Diagnosis not present

## 2020-02-01 DIAGNOSIS — Z4800 Encounter for change or removal of nonsurgical wound dressing: Secondary | ICD-10-CM | POA: Diagnosis not present

## 2020-02-01 DIAGNOSIS — K219 Gastro-esophageal reflux disease without esophagitis: Secondary | ICD-10-CM | POA: Diagnosis not present

## 2020-02-01 DIAGNOSIS — E1122 Type 2 diabetes mellitus with diabetic chronic kidney disease: Secondary | ICD-10-CM | POA: Diagnosis not present

## 2020-02-01 DIAGNOSIS — I129 Hypertensive chronic kidney disease with stage 1 through stage 4 chronic kidney disease, or unspecified chronic kidney disease: Secondary | ICD-10-CM | POA: Diagnosis not present

## 2020-02-01 DIAGNOSIS — L97818 Non-pressure chronic ulcer of other part of right lower leg with other specified severity: Secondary | ICD-10-CM | POA: Diagnosis not present

## 2020-02-02 DIAGNOSIS — L97829 Non-pressure chronic ulcer of other part of left lower leg with unspecified severity: Secondary | ICD-10-CM | POA: Diagnosis not present

## 2020-02-04 DIAGNOSIS — Z7901 Long term (current) use of anticoagulants: Secondary | ICD-10-CM | POA: Diagnosis not present

## 2020-02-04 DIAGNOSIS — Z4800 Encounter for change or removal of nonsurgical wound dressing: Secondary | ICD-10-CM | POA: Diagnosis not present

## 2020-02-04 DIAGNOSIS — I482 Chronic atrial fibrillation, unspecified: Secondary | ICD-10-CM | POA: Diagnosis not present

## 2020-02-04 DIAGNOSIS — L97818 Non-pressure chronic ulcer of other part of right lower leg with other specified severity: Secondary | ICD-10-CM | POA: Diagnosis not present

## 2020-02-04 DIAGNOSIS — K219 Gastro-esophageal reflux disease without esophagitis: Secondary | ICD-10-CM | POA: Diagnosis not present

## 2020-02-04 DIAGNOSIS — L97829 Non-pressure chronic ulcer of other part of left lower leg with unspecified severity: Secondary | ICD-10-CM | POA: Diagnosis not present

## 2020-02-04 DIAGNOSIS — E1122 Type 2 diabetes mellitus with diabetic chronic kidney disease: Secondary | ICD-10-CM | POA: Diagnosis not present

## 2020-02-04 DIAGNOSIS — I129 Hypertensive chronic kidney disease with stage 1 through stage 4 chronic kidney disease, or unspecified chronic kidney disease: Secondary | ICD-10-CM | POA: Diagnosis not present

## 2020-02-04 DIAGNOSIS — N1832 Chronic kidney disease, stage 3b: Secondary | ICD-10-CM | POA: Diagnosis not present

## 2020-02-04 DIAGNOSIS — I872 Venous insufficiency (chronic) (peripheral): Secondary | ICD-10-CM | POA: Diagnosis not present

## 2020-02-08 DIAGNOSIS — L97818 Non-pressure chronic ulcer of other part of right lower leg with other specified severity: Secondary | ICD-10-CM | POA: Diagnosis not present

## 2020-02-08 DIAGNOSIS — I129 Hypertensive chronic kidney disease with stage 1 through stage 4 chronic kidney disease, or unspecified chronic kidney disease: Secondary | ICD-10-CM | POA: Diagnosis not present

## 2020-02-08 DIAGNOSIS — I482 Chronic atrial fibrillation, unspecified: Secondary | ICD-10-CM | POA: Diagnosis not present

## 2020-02-08 DIAGNOSIS — I872 Venous insufficiency (chronic) (peripheral): Secondary | ICD-10-CM | POA: Diagnosis not present

## 2020-02-08 DIAGNOSIS — Z4800 Encounter for change or removal of nonsurgical wound dressing: Secondary | ICD-10-CM | POA: Diagnosis not present

## 2020-02-08 DIAGNOSIS — N1832 Chronic kidney disease, stage 3b: Secondary | ICD-10-CM | POA: Diagnosis not present

## 2020-02-08 DIAGNOSIS — K219 Gastro-esophageal reflux disease without esophagitis: Secondary | ICD-10-CM | POA: Diagnosis not present

## 2020-02-08 DIAGNOSIS — L97829 Non-pressure chronic ulcer of other part of left lower leg with unspecified severity: Secondary | ICD-10-CM | POA: Diagnosis not present

## 2020-02-08 DIAGNOSIS — Z7901 Long term (current) use of anticoagulants: Secondary | ICD-10-CM | POA: Diagnosis not present

## 2020-02-08 DIAGNOSIS — E1122 Type 2 diabetes mellitus with diabetic chronic kidney disease: Secondary | ICD-10-CM | POA: Diagnosis not present

## 2020-02-11 DIAGNOSIS — I129 Hypertensive chronic kidney disease with stage 1 through stage 4 chronic kidney disease, or unspecified chronic kidney disease: Secondary | ICD-10-CM | POA: Diagnosis not present

## 2020-02-11 DIAGNOSIS — Z4800 Encounter for change or removal of nonsurgical wound dressing: Secondary | ICD-10-CM | POA: Diagnosis not present

## 2020-02-11 DIAGNOSIS — E1122 Type 2 diabetes mellitus with diabetic chronic kidney disease: Secondary | ICD-10-CM | POA: Diagnosis not present

## 2020-02-11 DIAGNOSIS — N1832 Chronic kidney disease, stage 3b: Secondary | ICD-10-CM | POA: Diagnosis not present

## 2020-02-11 DIAGNOSIS — L97829 Non-pressure chronic ulcer of other part of left lower leg with unspecified severity: Secondary | ICD-10-CM | POA: Diagnosis not present

## 2020-02-11 DIAGNOSIS — K219 Gastro-esophageal reflux disease without esophagitis: Secondary | ICD-10-CM | POA: Diagnosis not present

## 2020-02-11 DIAGNOSIS — I482 Chronic atrial fibrillation, unspecified: Secondary | ICD-10-CM | POA: Diagnosis not present

## 2020-02-11 DIAGNOSIS — Z7901 Long term (current) use of anticoagulants: Secondary | ICD-10-CM | POA: Diagnosis not present

## 2020-02-11 DIAGNOSIS — L97818 Non-pressure chronic ulcer of other part of right lower leg with other specified severity: Secondary | ICD-10-CM | POA: Diagnosis not present

## 2020-02-11 DIAGNOSIS — I872 Venous insufficiency (chronic) (peripheral): Secondary | ICD-10-CM | POA: Diagnosis not present

## 2020-02-15 DIAGNOSIS — K219 Gastro-esophageal reflux disease without esophagitis: Secondary | ICD-10-CM | POA: Diagnosis not present

## 2020-02-15 DIAGNOSIS — Z7901 Long term (current) use of anticoagulants: Secondary | ICD-10-CM | POA: Diagnosis not present

## 2020-02-15 DIAGNOSIS — I482 Chronic atrial fibrillation, unspecified: Secondary | ICD-10-CM | POA: Diagnosis not present

## 2020-02-15 DIAGNOSIS — Z4800 Encounter for change or removal of nonsurgical wound dressing: Secondary | ICD-10-CM | POA: Diagnosis not present

## 2020-02-15 DIAGNOSIS — N1832 Chronic kidney disease, stage 3b: Secondary | ICD-10-CM | POA: Diagnosis not present

## 2020-02-15 DIAGNOSIS — I872 Venous insufficiency (chronic) (peripheral): Secondary | ICD-10-CM | POA: Diagnosis not present

## 2020-02-15 DIAGNOSIS — L97818 Non-pressure chronic ulcer of other part of right lower leg with other specified severity: Secondary | ICD-10-CM | POA: Diagnosis not present

## 2020-02-15 DIAGNOSIS — L97829 Non-pressure chronic ulcer of other part of left lower leg with unspecified severity: Secondary | ICD-10-CM | POA: Diagnosis not present

## 2020-02-15 DIAGNOSIS — E1122 Type 2 diabetes mellitus with diabetic chronic kidney disease: Secondary | ICD-10-CM | POA: Diagnosis not present

## 2020-02-15 DIAGNOSIS — I129 Hypertensive chronic kidney disease with stage 1 through stage 4 chronic kidney disease, or unspecified chronic kidney disease: Secondary | ICD-10-CM | POA: Diagnosis not present

## 2020-02-17 DIAGNOSIS — N1832 Chronic kidney disease, stage 3b: Secondary | ICD-10-CM | POA: Diagnosis not present

## 2020-02-17 DIAGNOSIS — H43813 Vitreous degeneration, bilateral: Secondary | ICD-10-CM | POA: Diagnosis not present

## 2020-02-17 DIAGNOSIS — Z4800 Encounter for change or removal of nonsurgical wound dressing: Secondary | ICD-10-CM | POA: Diagnosis not present

## 2020-02-17 DIAGNOSIS — I482 Chronic atrial fibrillation, unspecified: Secondary | ICD-10-CM | POA: Diagnosis not present

## 2020-02-17 DIAGNOSIS — E1122 Type 2 diabetes mellitus with diabetic chronic kidney disease: Secondary | ICD-10-CM | POA: Diagnosis not present

## 2020-02-17 DIAGNOSIS — Z7901 Long term (current) use of anticoagulants: Secondary | ICD-10-CM | POA: Diagnosis not present

## 2020-02-17 DIAGNOSIS — I872 Venous insufficiency (chronic) (peripheral): Secondary | ICD-10-CM | POA: Diagnosis not present

## 2020-02-17 DIAGNOSIS — L97818 Non-pressure chronic ulcer of other part of right lower leg with other specified severity: Secondary | ICD-10-CM | POA: Diagnosis not present

## 2020-02-17 DIAGNOSIS — L97829 Non-pressure chronic ulcer of other part of left lower leg with unspecified severity: Secondary | ICD-10-CM | POA: Diagnosis not present

## 2020-02-17 DIAGNOSIS — I129 Hypertensive chronic kidney disease with stage 1 through stage 4 chronic kidney disease, or unspecified chronic kidney disease: Secondary | ICD-10-CM | POA: Diagnosis not present

## 2020-02-17 DIAGNOSIS — K219 Gastro-esophageal reflux disease without esophagitis: Secondary | ICD-10-CM | POA: Diagnosis not present

## 2020-02-22 DIAGNOSIS — Z7901 Long term (current) use of anticoagulants: Secondary | ICD-10-CM | POA: Diagnosis not present

## 2020-02-22 DIAGNOSIS — E1122 Type 2 diabetes mellitus with diabetic chronic kidney disease: Secondary | ICD-10-CM | POA: Diagnosis not present

## 2020-02-22 DIAGNOSIS — I872 Venous insufficiency (chronic) (peripheral): Secondary | ICD-10-CM | POA: Diagnosis not present

## 2020-02-22 DIAGNOSIS — I482 Chronic atrial fibrillation, unspecified: Secondary | ICD-10-CM | POA: Diagnosis not present

## 2020-02-22 DIAGNOSIS — L97818 Non-pressure chronic ulcer of other part of right lower leg with other specified severity: Secondary | ICD-10-CM | POA: Diagnosis not present

## 2020-02-22 DIAGNOSIS — Z4800 Encounter for change or removal of nonsurgical wound dressing: Secondary | ICD-10-CM | POA: Diagnosis not present

## 2020-02-22 DIAGNOSIS — K219 Gastro-esophageal reflux disease without esophagitis: Secondary | ICD-10-CM | POA: Diagnosis not present

## 2020-02-22 DIAGNOSIS — I129 Hypertensive chronic kidney disease with stage 1 through stage 4 chronic kidney disease, or unspecified chronic kidney disease: Secondary | ICD-10-CM | POA: Diagnosis not present

## 2020-02-22 DIAGNOSIS — L97829 Non-pressure chronic ulcer of other part of left lower leg with unspecified severity: Secondary | ICD-10-CM | POA: Diagnosis not present

## 2020-02-22 DIAGNOSIS — N1832 Chronic kidney disease, stage 3b: Secondary | ICD-10-CM | POA: Diagnosis not present

## 2020-02-25 DIAGNOSIS — N1832 Chronic kidney disease, stage 3b: Secondary | ICD-10-CM | POA: Diagnosis not present

## 2020-02-25 DIAGNOSIS — L97829 Non-pressure chronic ulcer of other part of left lower leg with unspecified severity: Secondary | ICD-10-CM | POA: Diagnosis not present

## 2020-02-25 DIAGNOSIS — I129 Hypertensive chronic kidney disease with stage 1 through stage 4 chronic kidney disease, or unspecified chronic kidney disease: Secondary | ICD-10-CM | POA: Diagnosis not present

## 2020-02-25 DIAGNOSIS — Z7901 Long term (current) use of anticoagulants: Secondary | ICD-10-CM | POA: Diagnosis not present

## 2020-02-25 DIAGNOSIS — I482 Chronic atrial fibrillation, unspecified: Secondary | ICD-10-CM | POA: Diagnosis not present

## 2020-02-25 DIAGNOSIS — Z4800 Encounter for change or removal of nonsurgical wound dressing: Secondary | ICD-10-CM | POA: Diagnosis not present

## 2020-02-25 DIAGNOSIS — L97818 Non-pressure chronic ulcer of other part of right lower leg with other specified severity: Secondary | ICD-10-CM | POA: Diagnosis not present

## 2020-02-25 DIAGNOSIS — K219 Gastro-esophageal reflux disease without esophagitis: Secondary | ICD-10-CM | POA: Diagnosis not present

## 2020-02-25 DIAGNOSIS — E1122 Type 2 diabetes mellitus with diabetic chronic kidney disease: Secondary | ICD-10-CM | POA: Diagnosis not present

## 2020-02-25 DIAGNOSIS — I872 Venous insufficiency (chronic) (peripheral): Secondary | ICD-10-CM | POA: Diagnosis not present

## 2020-02-29 DIAGNOSIS — K219 Gastro-esophageal reflux disease without esophagitis: Secondary | ICD-10-CM | POA: Diagnosis not present

## 2020-02-29 DIAGNOSIS — I872 Venous insufficiency (chronic) (peripheral): Secondary | ICD-10-CM | POA: Diagnosis not present

## 2020-02-29 DIAGNOSIS — L97829 Non-pressure chronic ulcer of other part of left lower leg with unspecified severity: Secondary | ICD-10-CM | POA: Diagnosis not present

## 2020-02-29 DIAGNOSIS — N1832 Chronic kidney disease, stage 3b: Secondary | ICD-10-CM | POA: Diagnosis not present

## 2020-02-29 DIAGNOSIS — E1122 Type 2 diabetes mellitus with diabetic chronic kidney disease: Secondary | ICD-10-CM | POA: Diagnosis not present

## 2020-02-29 DIAGNOSIS — I482 Chronic atrial fibrillation, unspecified: Secondary | ICD-10-CM | POA: Diagnosis not present

## 2020-02-29 DIAGNOSIS — Z4800 Encounter for change or removal of nonsurgical wound dressing: Secondary | ICD-10-CM | POA: Diagnosis not present

## 2020-02-29 DIAGNOSIS — I129 Hypertensive chronic kidney disease with stage 1 through stage 4 chronic kidney disease, or unspecified chronic kidney disease: Secondary | ICD-10-CM | POA: Diagnosis not present

## 2020-02-29 DIAGNOSIS — Z7901 Long term (current) use of anticoagulants: Secondary | ICD-10-CM | POA: Diagnosis not present

## 2020-02-29 DIAGNOSIS — L97818 Non-pressure chronic ulcer of other part of right lower leg with other specified severity: Secondary | ICD-10-CM | POA: Diagnosis not present

## 2020-03-02 DIAGNOSIS — L97829 Non-pressure chronic ulcer of other part of left lower leg with unspecified severity: Secondary | ICD-10-CM | POA: Diagnosis not present

## 2020-03-03 DIAGNOSIS — K219 Gastro-esophageal reflux disease without esophagitis: Secondary | ICD-10-CM | POA: Diagnosis not present

## 2020-03-03 DIAGNOSIS — I129 Hypertensive chronic kidney disease with stage 1 through stage 4 chronic kidney disease, or unspecified chronic kidney disease: Secondary | ICD-10-CM | POA: Diagnosis not present

## 2020-03-03 DIAGNOSIS — Z4800 Encounter for change or removal of nonsurgical wound dressing: Secondary | ICD-10-CM | POA: Diagnosis not present

## 2020-03-03 DIAGNOSIS — E1122 Type 2 diabetes mellitus with diabetic chronic kidney disease: Secondary | ICD-10-CM | POA: Diagnosis not present

## 2020-03-03 DIAGNOSIS — N1832 Chronic kidney disease, stage 3b: Secondary | ICD-10-CM | POA: Diagnosis not present

## 2020-03-03 DIAGNOSIS — L97818 Non-pressure chronic ulcer of other part of right lower leg with other specified severity: Secondary | ICD-10-CM | POA: Diagnosis not present

## 2020-03-03 DIAGNOSIS — I872 Venous insufficiency (chronic) (peripheral): Secondary | ICD-10-CM | POA: Diagnosis not present

## 2020-03-03 DIAGNOSIS — L97829 Non-pressure chronic ulcer of other part of left lower leg with unspecified severity: Secondary | ICD-10-CM | POA: Diagnosis not present

## 2020-03-03 DIAGNOSIS — I482 Chronic atrial fibrillation, unspecified: Secondary | ICD-10-CM | POA: Diagnosis not present

## 2020-03-03 DIAGNOSIS — Z7901 Long term (current) use of anticoagulants: Secondary | ICD-10-CM | POA: Diagnosis not present

## 2020-03-08 DIAGNOSIS — I129 Hypertensive chronic kidney disease with stage 1 through stage 4 chronic kidney disease, or unspecified chronic kidney disease: Secondary | ICD-10-CM | POA: Diagnosis not present

## 2020-03-08 DIAGNOSIS — Z7901 Long term (current) use of anticoagulants: Secondary | ICD-10-CM | POA: Diagnosis not present

## 2020-03-08 DIAGNOSIS — K219 Gastro-esophageal reflux disease without esophagitis: Secondary | ICD-10-CM | POA: Diagnosis not present

## 2020-03-08 DIAGNOSIS — I482 Chronic atrial fibrillation, unspecified: Secondary | ICD-10-CM | POA: Diagnosis not present

## 2020-03-08 DIAGNOSIS — I872 Venous insufficiency (chronic) (peripheral): Secondary | ICD-10-CM | POA: Diagnosis not present

## 2020-03-08 DIAGNOSIS — E1122 Type 2 diabetes mellitus with diabetic chronic kidney disease: Secondary | ICD-10-CM | POA: Diagnosis not present

## 2020-03-08 DIAGNOSIS — N1832 Chronic kidney disease, stage 3b: Secondary | ICD-10-CM | POA: Diagnosis not present

## 2020-03-10 DIAGNOSIS — I872 Venous insufficiency (chronic) (peripheral): Secondary | ICD-10-CM | POA: Diagnosis not present

## 2020-03-10 DIAGNOSIS — I482 Chronic atrial fibrillation, unspecified: Secondary | ICD-10-CM | POA: Diagnosis not present

## 2020-03-10 DIAGNOSIS — E1122 Type 2 diabetes mellitus with diabetic chronic kidney disease: Secondary | ICD-10-CM | POA: Diagnosis not present

## 2020-03-10 DIAGNOSIS — K219 Gastro-esophageal reflux disease without esophagitis: Secondary | ICD-10-CM | POA: Diagnosis not present

## 2020-03-10 DIAGNOSIS — Z7901 Long term (current) use of anticoagulants: Secondary | ICD-10-CM | POA: Diagnosis not present

## 2020-03-10 DIAGNOSIS — I129 Hypertensive chronic kidney disease with stage 1 through stage 4 chronic kidney disease, or unspecified chronic kidney disease: Secondary | ICD-10-CM | POA: Diagnosis not present

## 2020-03-10 DIAGNOSIS — N1832 Chronic kidney disease, stage 3b: Secondary | ICD-10-CM | POA: Diagnosis not present

## 2020-03-15 DIAGNOSIS — I129 Hypertensive chronic kidney disease with stage 1 through stage 4 chronic kidney disease, or unspecified chronic kidney disease: Secondary | ICD-10-CM | POA: Diagnosis not present

## 2020-03-15 DIAGNOSIS — Z7901 Long term (current) use of anticoagulants: Secondary | ICD-10-CM | POA: Diagnosis not present

## 2020-03-15 DIAGNOSIS — I482 Chronic atrial fibrillation, unspecified: Secondary | ICD-10-CM | POA: Diagnosis not present

## 2020-03-15 DIAGNOSIS — I872 Venous insufficiency (chronic) (peripheral): Secondary | ICD-10-CM | POA: Diagnosis not present

## 2020-03-15 DIAGNOSIS — E1122 Type 2 diabetes mellitus with diabetic chronic kidney disease: Secondary | ICD-10-CM | POA: Diagnosis not present

## 2020-03-15 DIAGNOSIS — K219 Gastro-esophageal reflux disease without esophagitis: Secondary | ICD-10-CM | POA: Diagnosis not present

## 2020-03-15 DIAGNOSIS — N1832 Chronic kidney disease, stage 3b: Secondary | ICD-10-CM | POA: Diagnosis not present

## 2020-03-17 DIAGNOSIS — Z7901 Long term (current) use of anticoagulants: Secondary | ICD-10-CM | POA: Diagnosis not present

## 2020-03-17 DIAGNOSIS — I872 Venous insufficiency (chronic) (peripheral): Secondary | ICD-10-CM | POA: Diagnosis not present

## 2020-03-17 DIAGNOSIS — I482 Chronic atrial fibrillation, unspecified: Secondary | ICD-10-CM | POA: Diagnosis not present

## 2020-03-17 DIAGNOSIS — I129 Hypertensive chronic kidney disease with stage 1 through stage 4 chronic kidney disease, or unspecified chronic kidney disease: Secondary | ICD-10-CM | POA: Diagnosis not present

## 2020-03-17 DIAGNOSIS — E1122 Type 2 diabetes mellitus with diabetic chronic kidney disease: Secondary | ICD-10-CM | POA: Diagnosis not present

## 2020-03-17 DIAGNOSIS — N1832 Chronic kidney disease, stage 3b: Secondary | ICD-10-CM | POA: Diagnosis not present

## 2020-03-17 DIAGNOSIS — K219 Gastro-esophageal reflux disease without esophagitis: Secondary | ICD-10-CM | POA: Diagnosis not present

## 2020-03-21 ENCOUNTER — Other Ambulatory Visit: Payer: Self-pay

## 2020-03-21 ENCOUNTER — Inpatient Hospital Stay (HOSPITAL_COMMUNITY): Payer: Medicare Other

## 2020-03-21 ENCOUNTER — Encounter (HOSPITAL_COMMUNITY): Payer: Self-pay | Admitting: Internal Medicine

## 2020-03-21 ENCOUNTER — Inpatient Hospital Stay (HOSPITAL_COMMUNITY)
Admission: EM | Admit: 2020-03-21 | Discharge: 2020-03-28 | DRG: 177 | Disposition: A | Discharge status: Skilled Nursing Facility | Payer: Medicare Other | Attending: Internal Medicine | Admitting: Internal Medicine

## 2020-03-21 ENCOUNTER — Emergency Department (HOSPITAL_COMMUNITY): Payer: Medicare Other

## 2020-03-21 DIAGNOSIS — K2270 Barrett's esophagus without dysplasia: Secondary | ICD-10-CM | POA: Diagnosis not present

## 2020-03-21 DIAGNOSIS — I5031 Acute diastolic (congestive) heart failure: Secondary | ICD-10-CM | POA: Diagnosis not present

## 2020-03-21 DIAGNOSIS — Z7189 Other specified counseling: Secondary | ICD-10-CM | POA: Diagnosis not present

## 2020-03-21 DIAGNOSIS — M549 Dorsalgia, unspecified: Secondary | ICD-10-CM | POA: Diagnosis not present

## 2020-03-21 DIAGNOSIS — I959 Hypotension, unspecified: Secondary | ICD-10-CM | POA: Diagnosis not present

## 2020-03-21 DIAGNOSIS — I5033 Acute on chronic diastolic (congestive) heart failure: Secondary | ICD-10-CM | POA: Diagnosis not present

## 2020-03-21 DIAGNOSIS — I499 Cardiac arrhythmia, unspecified: Secondary | ICD-10-CM | POA: Diagnosis not present

## 2020-03-21 DIAGNOSIS — J189 Pneumonia, unspecified organism: Secondary | ICD-10-CM | POA: Diagnosis not present

## 2020-03-21 DIAGNOSIS — R001 Bradycardia, unspecified: Secondary | ICD-10-CM | POA: Diagnosis not present

## 2020-03-21 DIAGNOSIS — I509 Heart failure, unspecified: Secondary | ICD-10-CM

## 2020-03-21 DIAGNOSIS — Z7401 Bed confinement status: Secondary | ICD-10-CM | POA: Diagnosis not present

## 2020-03-21 DIAGNOSIS — I071 Rheumatic tricuspid insufficiency: Secondary | ICD-10-CM | POA: Diagnosis not present

## 2020-03-21 DIAGNOSIS — R54 Age-related physical debility: Secondary | ICD-10-CM | POA: Diagnosis present

## 2020-03-21 DIAGNOSIS — Z7901 Long term (current) use of anticoagulants: Secondary | ICD-10-CM

## 2020-03-21 DIAGNOSIS — K219 Gastro-esophageal reflux disease without esophagitis: Secondary | ICD-10-CM | POA: Diagnosis present

## 2020-03-21 DIAGNOSIS — Z9882 Breast implant status: Secondary | ICD-10-CM

## 2020-03-21 DIAGNOSIS — N1831 Chronic kidney disease, stage 3a: Secondary | ICD-10-CM | POA: Diagnosis present

## 2020-03-21 DIAGNOSIS — K5730 Diverticulosis of large intestine without perforation or abscess without bleeding: Secondary | ICD-10-CM | POA: Diagnosis present

## 2020-03-21 DIAGNOSIS — R404 Transient alteration of awareness: Secondary | ICD-10-CM | POA: Diagnosis not present

## 2020-03-21 DIAGNOSIS — I517 Cardiomegaly: Secondary | ICD-10-CM | POA: Diagnosis not present

## 2020-03-21 DIAGNOSIS — M1000 Idiopathic gout, unspecified site: Secondary | ICD-10-CM | POA: Diagnosis not present

## 2020-03-21 DIAGNOSIS — R739 Hyperglycemia, unspecified: Secondary | ICD-10-CM | POA: Diagnosis not present

## 2020-03-21 DIAGNOSIS — I4821 Permanent atrial fibrillation: Secondary | ICD-10-CM | POA: Diagnosis not present

## 2020-03-21 DIAGNOSIS — N179 Acute kidney failure, unspecified: Secondary | ICD-10-CM | POA: Diagnosis present

## 2020-03-21 DIAGNOSIS — M1990 Unspecified osteoarthritis, unspecified site: Secondary | ICD-10-CM | POA: Diagnosis present

## 2020-03-21 DIAGNOSIS — R600 Localized edema: Secondary | ICD-10-CM | POA: Diagnosis not present

## 2020-03-21 DIAGNOSIS — Z90710 Acquired absence of both cervix and uterus: Secondary | ICD-10-CM

## 2020-03-21 DIAGNOSIS — Z515 Encounter for palliative care: Secondary | ICD-10-CM

## 2020-03-21 DIAGNOSIS — R6889 Other general symptoms and signs: Secondary | ICD-10-CM | POA: Diagnosis not present

## 2020-03-21 DIAGNOSIS — M109 Gout, unspecified: Secondary | ICD-10-CM | POA: Diagnosis not present

## 2020-03-21 DIAGNOSIS — Z853 Personal history of malignant neoplasm of breast: Secondary | ICD-10-CM

## 2020-03-21 DIAGNOSIS — J690 Pneumonitis due to inhalation of food and vomit: Principal | ICD-10-CM | POA: Diagnosis present

## 2020-03-21 DIAGNOSIS — K22719 Barrett's esophagus with dysplasia, unspecified: Secondary | ICD-10-CM | POA: Diagnosis not present

## 2020-03-21 DIAGNOSIS — Z888 Allergy status to other drugs, medicaments and biological substances status: Secondary | ICD-10-CM

## 2020-03-21 DIAGNOSIS — J9601 Acute respiratory failure with hypoxia: Secondary | ICD-10-CM | POA: Diagnosis present

## 2020-03-21 DIAGNOSIS — Z66 Do not resuscitate: Secondary | ICD-10-CM | POA: Diagnosis present

## 2020-03-21 DIAGNOSIS — E875 Hyperkalemia: Secondary | ICD-10-CM | POA: Diagnosis not present

## 2020-03-21 DIAGNOSIS — Z803 Family history of malignant neoplasm of breast: Secondary | ICD-10-CM

## 2020-03-21 DIAGNOSIS — I482 Chronic atrial fibrillation, unspecified: Secondary | ICD-10-CM | POA: Diagnosis not present

## 2020-03-21 DIAGNOSIS — E785 Hyperlipidemia, unspecified: Secondary | ICD-10-CM | POA: Diagnosis present

## 2020-03-21 DIAGNOSIS — Z85828 Personal history of other malignant neoplasm of skin: Secondary | ICD-10-CM

## 2020-03-21 DIAGNOSIS — Z8249 Family history of ischemic heart disease and other diseases of the circulatory system: Secondary | ICD-10-CM

## 2020-03-21 DIAGNOSIS — I4891 Unspecified atrial fibrillation: Secondary | ICD-10-CM | POA: Diagnosis not present

## 2020-03-21 DIAGNOSIS — R0602 Shortness of breath: Secondary | ICD-10-CM | POA: Diagnosis not present

## 2020-03-21 DIAGNOSIS — Z20822 Contact with and (suspected) exposure to covid-19: Secondary | ICD-10-CM | POA: Diagnosis present

## 2020-03-21 DIAGNOSIS — Z79899 Other long term (current) drug therapy: Secondary | ICD-10-CM

## 2020-03-21 DIAGNOSIS — M2550 Pain in unspecified joint: Secondary | ICD-10-CM | POA: Diagnosis not present

## 2020-03-21 DIAGNOSIS — Z743 Need for continuous supervision: Secondary | ICD-10-CM | POA: Diagnosis not present

## 2020-03-21 DIAGNOSIS — Z886 Allergy status to analgesic agent status: Secondary | ICD-10-CM

## 2020-03-21 DIAGNOSIS — R0902 Hypoxemia: Secondary | ICD-10-CM | POA: Diagnosis not present

## 2020-03-21 DIAGNOSIS — F419 Anxiety disorder, unspecified: Secondary | ICD-10-CM | POA: Diagnosis present

## 2020-03-21 DIAGNOSIS — T460X5A Adverse effect of cardiac-stimulant glycosides and drugs of similar action, initial encounter: Secondary | ICD-10-CM | POA: Diagnosis not present

## 2020-03-21 DIAGNOSIS — E871 Hypo-osmolality and hyponatremia: Secondary | ICD-10-CM | POA: Diagnosis not present

## 2020-03-21 DIAGNOSIS — Y92230 Patient room in hospital as the place of occurrence of the external cause: Secondary | ICD-10-CM | POA: Diagnosis not present

## 2020-03-21 DIAGNOSIS — Z9013 Acquired absence of bilateral breasts and nipples: Secondary | ICD-10-CM

## 2020-03-21 DIAGNOSIS — I130 Hypertensive heart and chronic kidney disease with heart failure and stage 1 through stage 4 chronic kidney disease, or unspecified chronic kidney disease: Secondary | ICD-10-CM | POA: Diagnosis present

## 2020-03-21 DIAGNOSIS — J9690 Respiratory failure, unspecified, unspecified whether with hypoxia or hypercapnia: Secondary | ICD-10-CM | POA: Diagnosis not present

## 2020-03-21 DIAGNOSIS — E782 Mixed hyperlipidemia: Secondary | ICD-10-CM | POA: Diagnosis not present

## 2020-03-21 DIAGNOSIS — K449 Diaphragmatic hernia without obstruction or gangrene: Secondary | ICD-10-CM | POA: Diagnosis not present

## 2020-03-21 DIAGNOSIS — I10 Essential (primary) hypertension: Secondary | ICD-10-CM | POA: Diagnosis present

## 2020-03-21 DIAGNOSIS — R609 Edema, unspecified: Secondary | ICD-10-CM

## 2020-03-21 LAB — CBC WITH DIFFERENTIAL/PLATELET
Abs Immature Granulocytes: 0.05 10*3/uL (ref 0.00–0.07)
Basophils Absolute: 0 10*3/uL (ref 0.0–0.1)
Basophils Relative: 1 %
Eosinophils Absolute: 0.1 10*3/uL (ref 0.0–0.5)
Eosinophils Relative: 1 %
HCT: 39.7 % (ref 36.0–46.0)
Hemoglobin: 12.4 g/dL (ref 12.0–15.0)
Immature Granulocytes: 1 %
Lymphocytes Relative: 14 %
Lymphs Abs: 0.9 10*3/uL (ref 0.7–4.0)
MCH: 28.7 pg (ref 26.0–34.0)
MCHC: 31.2 g/dL (ref 30.0–36.0)
MCV: 91.9 fL (ref 80.0–100.0)
Monocytes Absolute: 1.1 10*3/uL — ABNORMAL HIGH (ref 0.1–1.0)
Monocytes Relative: 16 %
Neutro Abs: 4.4 10*3/uL (ref 1.7–7.7)
Neutrophils Relative %: 67 %
Platelets: 223 10*3/uL (ref 150–400)
RBC: 4.32 MIL/uL (ref 3.87–5.11)
RDW: 17.8 % — ABNORMAL HIGH (ref 11.5–15.5)
WBC: 6.6 10*3/uL (ref 4.0–10.5)
nRBC: 0 % (ref 0.0–0.2)

## 2020-03-21 LAB — RESP PANEL BY RT-PCR (FLU A&B, COVID) ARPGX2
Influenza A by PCR: NEGATIVE
Influenza B by PCR: NEGATIVE
SARS Coronavirus 2 by RT PCR: NEGATIVE

## 2020-03-21 LAB — DIGOXIN LEVEL: Digoxin Level: 1.1 ng/mL (ref 0.8–2.0)

## 2020-03-21 LAB — BRAIN NATRIURETIC PEPTIDE: B Natriuretic Peptide: 150.1 pg/mL — ABNORMAL HIGH (ref 0.0–100.0)

## 2020-03-21 LAB — COMPREHENSIVE METABOLIC PANEL
ALT: 19 U/L (ref 0–44)
AST: 32 U/L (ref 15–41)
Albumin: 3.4 g/dL — ABNORMAL LOW (ref 3.5–5.0)
Alkaline Phosphatase: 60 U/L (ref 38–126)
Anion gap: 9 (ref 5–15)
BUN: 64 mg/dL — ABNORMAL HIGH (ref 8–23)
CO2: 23 mmol/L (ref 22–32)
Calcium: 9.6 mg/dL (ref 8.9–10.3)
Chloride: 105 mmol/L (ref 98–111)
Creatinine, Ser: 1.93 mg/dL — ABNORMAL HIGH (ref 0.44–1.00)
GFR, Estimated: 24 mL/min — ABNORMAL LOW (ref >60)
Glucose, Bld: 172 mg/dL — ABNORMAL HIGH (ref 70–99)
Potassium: 5.1 mmol/L (ref 3.5–5.1)
Sodium: 137 mmol/L (ref 135–145)
Total Bilirubin: 1.7 mg/dL — ABNORMAL HIGH (ref 0.3–1.2)
Total Protein: 6.8 g/dL (ref 6.5–8.1)

## 2020-03-21 LAB — CREATININE, URINE, RANDOM: Creatinine, Urine: 21.93 mg/dL

## 2020-03-21 LAB — TROPONIN I (HIGH SENSITIVITY)
Troponin I (High Sensitivity): 41 ng/L — ABNORMAL HIGH (ref <18)
Troponin I (High Sensitivity): 45 ng/L — ABNORMAL HIGH (ref <18)

## 2020-03-21 LAB — SODIUM, URINE, RANDOM: Sodium, Ur: 72 mmol/L

## 2020-03-21 LAB — PROTIME-INR
INR: 2.3 — ABNORMAL HIGH (ref 0.8–1.2)
Prothrombin Time: 24.4 seconds — ABNORMAL HIGH (ref 11.4–15.2)

## 2020-03-21 LAB — LACTIC ACID, PLASMA: Lactic Acid, Venous: 0.9 mmol/L (ref 0.5–1.9)

## 2020-03-21 MED ORDER — ALPRAZOLAM 0.5 MG PO TABS
0.5000 mg | ORAL_TABLET | Freq: Every day | ORAL | Status: DC
  Administered 2020-03-21: 0.5 mg via ORAL
  Filled 2020-03-21: qty 1

## 2020-03-21 MED ORDER — PANTOPRAZOLE SODIUM 40 MG PO TBEC
40.0000 mg | DELAYED_RELEASE_TABLET | Freq: Every day | ORAL | Status: DC
  Administered 2020-03-22: 40 mg via ORAL
  Filled 2020-03-21: qty 1

## 2020-03-21 MED ORDER — ACETAMINOPHEN 325 MG PO TABS
650.0000 mg | ORAL_TABLET | ORAL | Status: DC | PRN
  Administered 2020-03-21 – 2020-03-28 (×11): 650 mg via ORAL
  Filled 2020-03-21 (×12): qty 2

## 2020-03-21 MED ORDER — ALPRAZOLAM 0.25 MG PO TABS: 0.5000 mg | ORAL_TABLET | Freq: Every evening | ORAL | Status: DC | PRN

## 2020-03-21 MED ORDER — SODIUM CHLORIDE 0.9 % IV SOLN
1.0000 g | Freq: Once | INTRAVENOUS | Status: AC
  Administered 2020-03-21: 1 g via INTRAVENOUS
  Filled 2020-03-21: qty 10

## 2020-03-21 MED ORDER — PRAVASTATIN SODIUM 40 MG PO TABS
40.0000 mg | ORAL_TABLET | Freq: Every day | ORAL | Status: DC
  Administered 2020-03-21 – 2020-03-28 (×8): 40 mg via ORAL
  Filled 2020-03-21 (×8): qty 1

## 2020-03-21 MED ORDER — SODIUM CHLORIDE 0.9 % IV SOLN: 250.0000 mL | INTRAVENOUS | Status: DC | PRN

## 2020-03-21 MED ORDER — DIGOXIN 125 MCG PO TABS: 125.0000 ug | ORAL_TABLET | Freq: Every day | ORAL | Status: DC

## 2020-03-21 MED ORDER — HYDRALAZINE HCL 25 MG PO TABS: 25.0000 mg | ORAL_TABLET | Freq: Four times a day (QID) | ORAL | Status: DC | PRN

## 2020-03-21 MED ORDER — HYDRALAZINE HCL 50 MG PO TABS
50.0000 mg | ORAL_TABLET | Freq: Two times a day (BID) | ORAL | Status: DC
  Administered 2020-03-21: 50 mg via ORAL
  Filled 2020-03-21 (×2): qty 1

## 2020-03-21 MED ORDER — SODIUM CHLORIDE 0.9% FLUSH: 3.0000 mL | INTRAVENOUS | Status: DC | PRN

## 2020-03-21 MED ORDER — WARFARIN SODIUM 2 MG PO TABS
2.0000 mg | ORAL_TABLET | Freq: Once | ORAL | Status: AC
  Administered 2020-03-21: 2 mg via ORAL
  Filled 2020-03-21 (×2): qty 1

## 2020-03-21 MED ORDER — DIGOXIN 125 MCG PO TABS
0.0625 mg | ORAL_TABLET | Freq: Every day | ORAL | Status: DC
  Administered 2020-03-22 – 2020-03-23 (×2): 0.0625 mg via ORAL
  Filled 2020-03-21 (×2): qty 1

## 2020-03-21 MED ORDER — ONDANSETRON HCL 4 MG/2ML IJ SOLN
4.0000 mg | Freq: Four times a day (QID) | INTRAMUSCULAR | Status: DC | PRN
  Administered 2020-03-23: 4 mg via INTRAVENOUS
  Filled 2020-03-21: qty 2

## 2020-03-21 MED ORDER — ACETAMINOPHEN 325 MG PO TABS: 325.0000 mg | ORAL_TABLET | Freq: Four times a day (QID) | ORAL | Status: DC | PRN

## 2020-03-21 MED ORDER — ALLOPURINOL 100 MG PO TABS
100.0000 mg | ORAL_TABLET | Freq: Every day | ORAL | Status: DC
  Administered 2020-03-22 – 2020-03-28 (×7): 100 mg via ORAL
  Filled 2020-03-21 (×7): qty 1

## 2020-03-21 MED ORDER — DIGOXIN 125 MCG PO TABS: 0.0625 mg | ORAL_TABLET | Freq: Every day | ORAL | Status: DC

## 2020-03-21 MED ORDER — FENOFIBRATE 160 MG PO TABS
160.0000 mg | ORAL_TABLET | Freq: Every day | ORAL | Status: DC
  Administered 2020-03-21 – 2020-03-28 (×8): 160 mg via ORAL
  Filled 2020-03-21 (×9): qty 1

## 2020-03-21 MED ORDER — CARBOXYMETHYLCELLUL-GLYCERIN 0.5-0.9 % OP SOLN: 1.0000 [drp] | Freq: Every day | OPHTHALMIC | Status: DC

## 2020-03-21 MED ORDER — SODIUM CHLORIDE 0.9% FLUSH
3.0000 mL | Freq: Two times a day (BID) | INTRAVENOUS | Status: DC
  Administered 2020-03-21 – 2020-03-27 (×12): 3 mL via INTRAVENOUS

## 2020-03-21 MED ORDER — FUROSEMIDE 10 MG/ML IJ SOLN
40.0000 mg | Freq: Every day | INTRAMUSCULAR | Status: DC
  Administered 2020-03-21 – 2020-03-22 (×2): 40 mg via INTRAVENOUS
  Filled 2020-03-21 (×2): qty 4

## 2020-03-21 MED ORDER — ISOSORBIDE MONONITRATE ER 30 MG PO TB24
30.0000 mg | ORAL_TABLET | Freq: Every day | ORAL | Status: DC
  Filled 2020-03-21: qty 1

## 2020-03-21 MED ORDER — SODIUM CHLORIDE 0.9 % IV SOLN
500.0000 mg | Freq: Once | INTRAVENOUS | Status: AC
  Administered 2020-03-21: 500 mg via INTRAVENOUS
  Filled 2020-03-21: qty 500

## 2020-03-21 MED ORDER — HYDRALAZINE HCL 25 MG PO TABS: 50.0000 mg | ORAL_TABLET | Freq: Three times a day (TID) | ORAL | Status: DC

## 2020-03-21 MED ORDER — WARFARIN - PHARMACIST DOSING INPATIENT: Freq: Every day | Status: DC

## 2020-03-21 NOTE — ED Notes (Signed)
Unable to collect second culture.

## 2020-03-21 NOTE — ED Notes (Signed)
Pt transported to CT ?

## 2020-03-21 NOTE — Procedures (Signed)
Echo attempted. Patient in CT at 3:54 pm. Will attempt again as time permits.

## 2020-03-21 NOTE — H&P (Signed)
History and Physical    Deanna [name]:[id number] DOB: 06/15/1926 DOA: 03/21/2020  PCP: Lajean Manes, MD (Confirm with patient/family/NH records and if not entered, this has to be entered at [facility] point of entry) Patient coming from: Home  I have personally briefly reviewed patient's old medical records in [place]  Chief Complaint: SOB, weakness  HPI: Deanna [name] is a 87 y.o. female with medical history significant of poorly controlled HTN, chronic diastolic CHF, PAF on Coumadin, breast cancer status post mastectomy, Gout, multi-joint OA, HLD, intermittent increasing shortness of breath for 1 week.  Blood pressure has been poorly controlled since January, his PCP has been managing his blood pressure medications and increased hydralazine from 25-50 3 times daily.  She has had some point his PCP also increase his diuresis but then discontinued due to worsening of kidney function about 3-4 weeks ago.  Currently, she has been only taking Lasix 20 mg MWF.  She estimated about 7 to 8 pound weight gain since January, and for about a week or so, has been having increasing shortness of breath associated with cough sometimes coughing up clear phlegm, no fever chills.  Orthopnea for 1 week and unable to sleep at night or whenever lying flat. Family called EMS and EMS arrived found patient's pulse oxygen in 76s.  ED Course: Physical exam showed fluid overload, chest x-ray cardiomegaly, also suspect for pneumonia.  WBC within normal limits.  Kidney function creatinine 1.9 BUN 64.  Review of Systems: As per HPI otherwise 14 point review of systems negative.  .  Past Medical History:  Diagnosis Date   Anxiety    Arthritis    Atrial fibrillation ([place])    Barrett's esophagus    Breast cancer ([place])    Colon polyp 2009   TUBULAR ADENOMA   Diverticulosis of colon (without mention of hemorrhage) 2009   GERD (gastroesophageal reflux disease)    Hyperlipidemia    Hypertension     Status post dilation of esophageal narrowing    TRICUSPID REGURGITATION 10/23/2006    Past Surgical History:  Procedure Laterality Date   ABDOMINAL HYSTERECTOMY     bcc-face     CATARACT EXTRACTION Bilateral    MASTECTOMY Bilateral      reports that she has never smoked. She has never used smokeless tobacco. She reports current alcohol use of about 5.0 standard drinks of alcohol per week. She reports that she does not use drugs.  Allergies  Allergen Reactions   Aspirin Other (See Comments)    REACTION: nervousness---tolerates ibuprofen   Atorvastatin Other (See Comments)    myalgia    Family History  Problem Relation Age of Onset   Colon cancer Mother    Hypertension Mother    Colon cancer Father    Breast cancer Other    Colon polyps Son      Prior to Admission medications   Medication Sig Start Date End Date Taking? Authorizing Provider  acetaminophen (TYLENOL) 325 MG tablet Take 325 mg by mouth every 6 (six) hours as needed for moderate pain.     [provider]  allopurinol (ZYLOPRIM) 100 MG tablet Take 100 mg by mouth daily. 01/20/20   [provider]  allopurinol (ZYLOPRIM) 300 MG tablet Take 1 tablet by mouth  daily 06/20/15   Burchette, Alinda Sierras, MD  ALPRAZolam Duanne Moron) 0.5 MG tablet Take 1 tablet (0.5 mg total) by mouth at bedtime as needed for anxiety. 06/09/18   Ripley Fraise, MD  amLODipine (  NORVASC) 5 MG tablet Take 1 tablet (5 mg total) by mouth daily. 10/21/15   Burchette, Alinda Sierras, MD  Carboxymethylcellul-Glycerin (OPTIVE) 0.5-0.9 % SOLN Place 1 drop into both eyes daily.     [provider]  [place] 125 MCG tablet TAKE 1 TABLET BY MOUTH  DAILY 05/24/15   Burchette, Alinda Sierras, MD  fenofibrate 160 MG tablet TAKE 1 TABLET BY MOUTH  DAILY 06/08/16   Burchette, Alinda Sierras, MD  hydrALAZINE (APRESOLINE) 25 MG tablet Take 1 tablet (25 mg total) by mouth every 8 (eight) hours. 10/21/15   Burchette, Alinda Sierras, MD  losartan (COZAAR) 25 MG tablet  Take 25 mg by mouth daily. 02/17/20   [provider]  Multiple Vitamin (MULTIVITAMIN) tablet Take 1 tablet by mouth daily.      [provider]  omeprazole (PRILOSEC) 20 MG capsule Take 1 capsule (20 mg total) by mouth daily. 08/01/15   Burchette, Alinda Sierras, MD  pravastatin (PRAVACHOL) 40 MG tablet TAKE 1 TABLET BY MOUTH  DAILY 10/21/15   Burchette, Alinda Sierras, MD  spironolactone (ALDACTONE) 50 MG tablet Take 1 tablet by mouth  daily 05/24/15   Burchette, Alinda Sierras, MD  traMADol (ULTRAM) 50 MG tablet Take 1 tablet (50 mg total) by mouth every 6 (six) hours as needed. for pain 08/01/15   Eulas Post, MD  warfarin (COUMADIN) 1 MG tablet Take as directed by  Anticoagulation Clinic Patient taking differently: Take 1-2 mg by mouth every morning. Takes 2mg  on Wed and Sat takes 1mg  all other days 09/19/15   Eulas Post, MD    Physical Exam: Vitals:   03/21/20 1430 03/21/20 1445 03/21/20 1455 03/21/20 1500  BP: (!) 121/49 130/62 130/62 140/61  Pulse: (!) 48 (!) 53 (!) 54 (!) 54  Resp: 20 16 (!) 21 14  Temp:      TempSrc:      SpO2: 97% 96% 96% 96%  Weight:      Height:        Constitutional: NAD, calm, comfortable Vitals:   03/21/20 1430 03/21/20 1445 03/21/20 1455 03/21/20 1500  BP: (!) 121/49 130/62 130/62 140/61  Pulse: (!) 48 (!) 53 (!) 54 (!) 54  Resp: 20 16 (!) 21 14  Temp:      TempSrc:      SpO2: 97% 96% 96% 96%  Weight:      Height:       Eyes: PERRL, lids and conjunctivae normal ENMT: Mucous membranes are moist. Posterior pharynx clear of any exudate or lesions.Normal dentition.  Neck: normal, supple, no masses, no thyromegaly Respiratory: clear to auscultation bilaterally, no wheezing, fine crackles on bilateral bases, increasing breathing effort. No accessory muscle use.  Cardiovascular: Regular rate and rhythm, systolic murmur on heart apex.  2+ extremity edema, anasarca. 2+ pedal pulses. No carotid bruits.  Abdomen: no tenderness, no masses palpated.  No hepatosplenomegaly. Bowel sounds positive.  Musculoskeletal: no clubbing / cyanosis. No joint deformity upper and lower extremities. Good ROM, no contractures. Normal muscle tone.  Skin: no rashes, lesions, ulcers. No induration Neurologic: CN 2-12 grossly intact. Sensation intact, DTR normal. Strength 5/5 in all 4.  Psychiatric: Normal judgment and insight. Alert and oriented x 3. Normal mood.     Labs on Admission: I have personally reviewed following labs and imaging studies  CBC: Recent Labs  Lab 03/21/20 1252  WBC 6.6  NEUTROABS 4.4  HGB 12.4  HCT 39.7  MCV 91.9  PLT 914   Basic Metabolic  Panel: Recent Labs  Lab 03/21/20 1252  NA 137  K 5.1  CL 105  CO2 23  GLUCOSE 172*  BUN 64*  CREATININE 1.93*  CALCIUM 9.6   GFR: Estimated Creatinine Clearance: 16.4 mL/min (A) (by C-G formula based on SCr of 1.93 mg/dL (H)). Liver Function Tests: Recent Labs  Lab 03/21/20 1252  AST 32  ALT 19  ALKPHOS 60  BILITOT 1.7*  PROT 6.8  ALBUMIN 3.4*   No results for input(s): LIPASE, AMYLASE in the last 168 hours. No results for input(s): AMMONIA in the last 168 hours. Coagulation Profile: Recent Labs  Lab 03/21/20 1252  INR 2.3*   Cardiac Enzymes: No results for input(s): CKTOTAL, CKMB, CKMBINDEX, TROPONINI in the last 168 hours. BNP (last 3 results) No results for input(s): PROBNP in the last 8760 hours. HbA1C: No results for input(s): HGBA1C in the last 72 hours. CBG: No results for input(s): GLUCAP in the last 168 hours. Lipid Profile: No results for input(s): CHOL, HDL, LDLCALC, TRIG, CHOLHDL, LDLDIRECT in the last 72 hours. Thyroid Function Tests: No results for input(s): TSH, T4TOTAL, FREET4, T3FREE, THYROIDAB in the last 72 hours. Anemia Panel: No results for input(s): VITAMINB12, FOLATE, FERRITIN, TIBC, IRON, RETICCTPCT in the last 72 hours. Urine analysis:    Component Value Date/Time   COLORURINE YELLOW 06/09/2018 0400   APPEARANCEUR CLEAR  06/09/2018 0400   LABSPEC 1.010 06/09/2018 0400   PHURINE 6.0 06/09/2018 0400   GLUCOSEU NEGATIVE 06/09/2018 0400   HGBUR NEGATIVE 06/09/2018 0400   HGBUR negative 03/02/2008 1312   BILIRUBINUR NEGATIVE 06/09/2018 0400   KETONESUR NEGATIVE 06/09/2018 0400   PROTEINUR 100 (A) 06/09/2018 0400   UROBILINOGEN 0.2 09/10/2013 1721   NITRITE NEGATIVE 06/09/2018 0400   LEUKOCYTESUR NEGATIVE 06/09/2018 0400    Radiological Exams on Admission: DG Chest Port 1 View  Result Date: 03/21/2020 CLINICAL DATA:  Shortness of breath EXAM: PORTABLE CHEST 1 VIEW COMPARISON:  None. FINDINGS: There is consolidation in the left lower lobe medially. The lungs elsewhere are clear. There is cardiomegaly with pulmonary vascularity normal. There is aortic atherosclerosis. There is a large paraesophageal type hernia. No adenopathy appreciable. There are surgical clips in the right axillary region. IMPRESSION: 1. Left lower lobe consolidation medially, likely due to pneumonia. A lesion obstructing the left lower lobe bronchus cannot be excluded on this study. 2.  Cardiomegaly. 3.  Large paraesophageal type hernia. 4.  Aortic Atherosclerosis (ICD10-I70.0). Followup PA and lateral chest radiographs recommended in 3-4 weeks following trial of antibiotic therapy to ensure resolution and exclude underlying malignancy. Electronically Signed   By: Lowella Grip III M.D.   On: 03/21/2020 12:51    EKG: Independently reviewed.  A. fib, borderline bradycardia  Assessment/Plan Active Problems:   Acute CHF (congestive heart failure) (HCC)   CHF (congestive heart failure) ([place])  (please populate well all problems here in Problem List. (For example, if patient is on BP meds at home and you resume or decide to hold them, it is a problem that needs to be her. Same for CAD, COPD, HLD and so on)  Acute hypoxic respite failure -Secondary to acute on chronic diastolic CHF decompensation, likely from uncontrolled hypertension and under  diuresis. -Significant fluid overload, start Lasix 20 mg daily. -For blood pressure control, discontinue amlodipine and losartan and Aldactone due to leg swelling and AKI.  Increase hydralazine to 50 mg 3 times daily and add Imdur 30 mg daily.  Adjust according to response. -Echo  AKI -Fluid overload now -  Suspect cardiorenal syndrome -Diuresis and daily lab -FeNa.  Question of PNA -Unlikely, but will check CT chest given the finding of retro-cardiac infiltrates. -Monitor off ABX for now.  Elevated Glucose -Check A1C.  PAF -Rate controlled -Digoxin level within normal limits -Consult pharmacy for Coumadin  Gout/OA -PT evaluation  DVT prophylaxis: Coumadin Code Status: DNR Family Communication: Daughter at bedside Disposition Plan: Expect 3 to 4 days hospital stay for diuresis and monitor kidney function. Consults called: None Admission status: Tele admit   Lequita Halt MD [facility] Pager [phone]  03/21/2020, 3:18 PM

## 2020-03-21 NOTE — Progress Notes (Signed)
  ANTICOAGULATION CONSULT NOTE - Initial Consult  Pharmacy Consult for Warfarin Indication: atrial fibrillation  Allergies  Allergen Reactions  . Aspirin Other (See Comments)    REACTION: nervousness---tolerates ibuprofen  . Atorvastatin Other (See Comments)    myalgia    Patient Measurements: Height: 5\' 4"  (162.6 cm) Weight: 60.8 kg (134 lb) IBW/kg (Calculated) : 54.7  Vital Signs: Temp: 97.7 F (36.5 C) (03/14 1219) Temp Source: Oral (03/14 1219) BP: 140/61 (03/14 1500) Pulse Rate: 54 (03/14 1500)  Labs: Recent Labs    03/21/20 1252  HGB 12.4  HCT 39.7  PLT 223  LABPROT 24.4*  INR 2.3*  CREATININE 1.93*  TROPONINIHS 41*    Estimated Creatinine Clearance: 16.4 mL/min (A) (by C-G formula based on SCr of 1.93 mg/dL (H)).   Medical History: Past Medical History:  Diagnosis Date  . Anxiety   . Arthritis   . Atrial fibrillation ([place])   . Barrett's esophagus   . Breast cancer ([place])   . Colon polyp 2009   TUBULAR ADENOMA  . Diverticulosis of colon (without mention of hemorrhage) 2009  . GERD (gastroesophageal reflux disease)   . Hyperlipidemia   . Hypertension   . Status post dilation of esophageal narrowing   . TRICUSPID REGURGITATION 10/23/2006   Assessment: Pt is a 68 YOF with history of Afib on warfarin PTA presenting with CHF exacerbation. Per patient, INR has recently been high and regimen has been changing frequently. Pt reports latest regimen was alternating between 1 and 2 mg (1 mg > 2 mg > 1 mg > 2 mg, etc). Her last dose of warfarin was last night and she took 1 mg. INR on admission therapeutic at 2.3, Scr 1.93 (no recent baseline), CBC WNL.   DDI's Azithromycin may increase serum concentration of warfarin. In addition, PTA meds fenofibrate, allopurinol, omeprazole, pravastatin, and digoxin continued inpt may also enhance effect of warfarin. Will monitor INR and additional medications for interactions.  Goal of Therapy:  INR 2-3 Monitor platelets  by anticoagulation protocol: Yes   Plan:  Warfarin 2 mg tonight Daily INR and CBC  Jacobo Forest PharmD Candidate 2022 03/21/2020 3:33 PM

## 2020-03-21 NOTE — ED Provider Notes (Signed)
[facility] EMERGENCY DEPARTMENT Provider Note   CSN: [id number] Arrival date & time: 03/21/20  1208     History Chief Complaint  Patient presents with  . Shortness of Breath    Deanna [name] is a 87 y.o. female.  He has a history of A. fib and is anticoagulation.  She has been more short of breath for a week and worse since last night.  She is also been troubled with fluid on her legs and they have tried wraps and compression stockings.  She intermittently gets chest pain.  She is on a diuretic.  EMS says her initial pulse ox was 79%.  She was put on 4 L.  Does not normally require oxygen.  Some cough, nonproductive.  No known fevers.  The history is provided by the patient.  Shortness of Breath Severity:  Moderate Onset quality:  Gradual Duration:  1 week Timing:  Intermittent Progression:  Worsening Chronicity:  New Context: activity   Relieved by:  Nothing Worsened by:  Activity Ineffective treatments:  Diuretics Associated symptoms: abdominal pain, chest pain and cough   Associated symptoms: no fever, no headaches, no hemoptysis, no neck pain, no rash, no sore throat, no sputum production and no vomiting        Past Medical History:  Diagnosis Date  . Anxiety   . Arthritis   . Atrial fibrillation ([place])   . Barrett's esophagus   . Breast cancer ([place])   . Colon polyp 2009   TUBULAR ADENOMA  . Diverticulosis of colon (without mention of hemorrhage) 2009  . GERD (gastroesophageal reflux disease)   . Hyperlipidemia   . Hypertension   . Status post dilation of esophageal narrowing   . TRICUSPID REGURGITATION 10/23/2006    Patient Active Problem List   Diagnosis Date Noted  . Hypertensive urgency 09/21/2015  . Chest pain 09/20/2015  . Hypercalcemia 05/09/2015  . Encounter for therapeutic drug monitoring 02/02/2013  . Personal history of colonic polyps 04/07/2012  . Barrett's esophagus 04/07/2012  . Osteoarthritis 02/01/2012  . URI (upper  respiratory infection) 01/16/2012  . Gout 12/24/2007  . TRICUSPID REGURGITATION 10/23/2006  . ATRIAL FIBRILLATION 10/15/2006  . HYPERGLYCEMIA 10/15/2006  . Hyperlipidemia 09/23/2006  . ANXIETY 09/23/2006  . Essential hypertension 09/23/2006  . GERD 09/23/2006    Past Surgical History:  Procedure Laterality Date  . ABDOMINAL HYSTERECTOMY    . bcc-face    . CATARACT EXTRACTION Bilateral   . MASTECTOMY Bilateral      OB History   No obstetric history on file.     Family History  Problem Relation Age of Onset  . Colon cancer Mother   . Hypertension Mother   . Colon cancer Father   . Breast cancer Other   . Colon polyps Son     Social History   Tobacco Use  . Smoking status: Never Smoker  . Smokeless tobacco: Never Used  Substance Use Topics  . Alcohol use: Yes    Alcohol/week: 5.0 standard drinks    Types: 5 drink(s) per week  . Drug use: No    Home Medications Prior to Admission medications   Medication Sig Start Date End Date Taking? Authorizing Provider  acetaminophen (TYLENOL) 325 MG tablet Take 325 mg by mouth every 6 (six) hours as needed for moderate pain.     [provider]  allopurinol (ZYLOPRIM) 300 MG tablet Take 1 tablet by mouth  daily 06/20/15   Burchette, Alinda Sierras, MD  ALPRAZolam Duanne Moron)  0.5 MG tablet Take 1 tablet (0.5 mg total) by mouth at bedtime as needed for anxiety. 06/09/18   Ripley Fraise, MD  amLODipine (NORVASC) 5 MG tablet Take 1 tablet (5 mg total) by mouth daily. 10/21/15   Burchette, Alinda Sierras, MD  Carboxymethylcellul-Glycerin (OPTIVE) 0.5-0.9 % SOLN Place 1 drop into both eyes daily.     [provider]  [place] 125 MCG tablet TAKE 1 TABLET BY MOUTH  DAILY 05/24/15   Burchette, Alinda Sierras, MD  fenofibrate 160 MG tablet TAKE 1 TABLET BY MOUTH  DAILY 06/08/16   Burchette, Alinda Sierras, MD  hydrALAZINE (APRESOLINE) 25 MG tablet Take 1 tablet (25 mg total) by mouth every 8 (eight) hours. 10/21/15   Burchette, Alinda Sierras, MD  Multiple  Vitamin (MULTIVITAMIN) tablet Take 1 tablet by mouth daily.      [provider]  omeprazole (PRILOSEC) 20 MG capsule Take 1 capsule (20 mg total) by mouth daily. 08/01/15   Burchette, Alinda Sierras, MD  pravastatin (PRAVACHOL) 40 MG tablet TAKE 1 TABLET BY MOUTH  DAILY 10/21/15   Burchette, Alinda Sierras, MD  spironolactone (ALDACTONE) 50 MG tablet Take 1 tablet by mouth  daily 05/24/15   Burchette, Alinda Sierras, MD  traMADol (ULTRAM) 50 MG tablet Take 1 tablet (50 mg total) by mouth every 6 (six) hours as needed. for pain 08/01/15   Eulas Post, MD  warfarin (COUMADIN) 1 MG tablet Take as directed by  Anticoagulation Clinic Patient taking differently: Take 1-2 mg by mouth every morning. Takes 2mg  on Wed and Sat takes 1mg  all other days 09/19/15   Eulas Post, MD    Allergies    Aspirin and Atorvastatin  Review of Systems   Review of Systems  Constitutional: Negative for fever.  HENT: Negative for sore throat.   Eyes: Negative for visual disturbance.  Respiratory: Positive for cough and shortness of breath. Negative for hemoptysis and sputum production.   Cardiovascular: Positive for chest pain.  Gastrointestinal: Positive for abdominal pain. Negative for vomiting.  Genitourinary: Negative for dysuria.  Musculoskeletal: Negative for neck pain.  Skin: Positive for wound. Negative for rash.  Neurological: Negative for headaches.    Physical Exam Updated Vital Signs BP (!) 141/65 (BP Location: Right Arm)   Pulse (!) 53   Temp 97.7 F (36.5 C) (Oral)   Resp (!) 22   SpO2 98%   Physical Exam Vitals and nursing note reviewed.  Constitutional:      General: She is not in acute distress.    Appearance: She is well-developed.  HENT:     Head: Normocephalic and atraumatic.  Eyes:     Conjunctiva/sclera: Conjunctivae normal.  Cardiovascular:     Rate and Rhythm: Bradycardia present. Rhythm irregular.     Heart sounds: No murmur heard.   Pulmonary:     Effort: Tachypnea and  respiratory distress present.     Breath sounds: Normal breath sounds.  Abdominal:     Palpations: Abdomen is soft.     Tenderness: There is no abdominal tenderness.  Musculoskeletal:     Cervical back: Neck supple.     Right lower leg: Tenderness present. Edema present.     Left lower leg: Tenderness present. Edema present.  Skin:    General: Skin is warm and dry.     Capillary Refill: Capillary refill takes less than 2 seconds.  Neurological:     General: No focal deficit present.     Mental Status: She is alert.  ED Results / Procedures / Treatments   Labs (all labs ordered are listed, but only abnormal results are displayed) Labs Reviewed  PROTIME-INR - Abnormal; Notable for the following components:      Result Value   Prothrombin Time 24.4 (*)    INR 2.3 (*)    All other components within normal limits  CBC WITH DIFFERENTIAL/PLATELET - Abnormal; Notable for the following components:   RDW 17.8 (*)    Monocytes Absolute 1.1 (*)    All other components within normal limits  COMPREHENSIVE METABOLIC PANEL - Abnormal; Notable for the following components:   Glucose, Bld 172 (*)    BUN 64 (*)    Creatinine, Ser 1.93 (*)    Albumin 3.4 (*)    Total Bilirubin 1.7 (*)    GFR, Estimated 24 (*)    All other components within normal limits  BRAIN NATRIURETIC PEPTIDE - Abnormal; Notable for the following components:   B Natriuretic Peptide 150.1 (*)    All other components within normal limits  TROPONIN I (HIGH SENSITIVITY) - Abnormal; Notable for the following components:   Troponin I (High Sensitivity) 41 (*)    All other components within normal limits  TROPONIN I (HIGH SENSITIVITY) - Abnormal; Notable for the following components:   Troponin I (High Sensitivity) 45 (*)    All other components within normal limits  RESP PANEL BY RT-PCR (FLU A&B, COVID) ARPGX2  CULTURE, BLOOD (ROUTINE X 2)  CULTURE, BLOOD (ROUTINE X 2)  DIGOXIN LEVEL  LACTIC ACID, PLASMA  SODIUM,  URINE, RANDOM  CREATININE, URINE, RANDOM  BASIC METABOLIC PANEL  HEMOGLOBIN A1C  PROTIME-INR  CBC    EKG EKG Interpretation  Date/Time:  Monday March 21 2020 12:30:00 EDT Ventricular Rate:  52 PR Interval:    QRS Duration: 139 QT Interval:  430 QTC Calculation: 400 R Axis:   93 Text Interpretation: Proabable afib Nonspecific intraventricular conduction delay Repol abnrm suggests ischemia, diffuse leads Confirmed by Aletta Edouard [phone]) on 03/21/2020 12:40:59 PM Also confirmed by Aletta Edouard [phone]), editor Hattie Perch ([id number])  on 03/21/2020 1:58:24 PM   Radiology CT Chest Wo Contrast  Result Date: 03/21/2020 CLINICAL DATA:  Shortness of breath and cough. EXAM: CT CHEST WITHOUT CONTRAST TECHNIQUE: Multidetector CT imaging of the chest was performed following the standard protocol without IV contrast. COMPARISON:  Chest x-ray, same date. FINDINGS: Cardiovascular: Significant cardiac enlargement involving all 4 chambers. No pericardial effusion. Some fluid noted in the pericardial recesses. The aorta is normal in caliber. Moderate tortuosity and atherosclerotic calcifications are noted. Aberrant right subclavian artery with moderate atherosclerotic calcifications. Fairly extensive three-vessel coronary artery calcifications. Mediastinum/Nodes: Scattered mediastinal and hilar lymph nodes but no mass or overt adenopathy. The esophagus is grossly normal. There is a very large hiatal hernia not significantly changed when compared to a prior abdominal CT scan 2020. The entire stomach is up in the chest along with part of the transverse colon. Lungs/Pleura: The lungs are clear of an acute process. There is moderate streaky subsegmental atelectasis adjacent to the large hiatal hernia sac. No worrisome pulmonary lesions. No pulmonary edema or pleural effusions. Upper Abdomen: No significant upper abdominal findings. Musculoskeletal: Bilateral breast prostheses.  No breast masses. The bony  thorax is intact. IMPRESSION: 1. Cardiac enlargement.  No pericardial effusion. 2. Aberrant right subclavian artery. 3. Very large hiatal hernia with the entire stomach up in the chest along with part of the transverse colon. 4. Areas of subsegmental atelectasis adjacent to the large hiatal  hernia sac. 5. No acute pulmonary findings or worrisome pulmonary lesions. 6. Aortic atherosclerosis. Aortic Atherosclerosis (ICD10-I70.0). Electronically Signed   By: Marijo Sanes M.D.   On: 03/21/2020 16:13   DG Chest Port 1 View  Result Date: 03/21/2020 CLINICAL DATA:  Shortness of breath EXAM: PORTABLE CHEST 1 VIEW COMPARISON:  None. FINDINGS: There is consolidation in the left lower lobe medially. The lungs elsewhere are clear. There is cardiomegaly with pulmonary vascularity normal. There is aortic atherosclerosis. There is a large paraesophageal type hernia. No adenopathy appreciable. There are surgical clips in the right axillary region. IMPRESSION: 1. Left lower lobe consolidation medially, likely due to pneumonia. A lesion obstructing the left lower lobe bronchus cannot be excluded on this study. 2.  Cardiomegaly. 3.  Large paraesophageal type hernia. 4.  Aortic Atherosclerosis (ICD10-I70.0). Followup PA and lateral chest radiographs recommended in 3-4 weeks following trial of antibiotic therapy to ensure resolution and exclude underlying malignancy. Electronically Signed   By: Lowella Grip III M.D.   On: 03/21/2020 12:51    Procedures Procedures   Medications Ordered in ED Medications - No data to display  ED Course  I have reviewed the triage vital signs and the nursing notes.  Pertinent labs & imaging results that were available during my care of the patient were reviewed by me and considered in my medical decision making (see chart for details).  Clinical Course as of 03/21/20 1720  Mon Mar 21, 2020  1348 Chest x-ray showing cardiomegaly probable left-sided infiltrate. [MB]  32 Discussed  with Dr. Roosevelt Locks [facility] hospitalist who will evaluate the patient for admission. [MB]    Clinical Course User Index [MB] Hayden Rasmussen, MD   MDM Rules/Calculators/A&P                          CHA2DS2/VAS Stroke Risk Points  Current as of about an hour ago     6 >= 2 Points: High Risk  1 - 1.99 Points: Medium Risk  0 Points: Low Risk    No Change      Details    This score determines the patient's risk of having a stroke if the  patient has atrial fibrillation.       Points Metrics  1 Has Congestive Heart Failure:  Yes    Current as of about an hour ago  0 Has Vascular Disease:  No    Current as of about an hour ago  1 Has Hypertension:  Yes    Current as of about an hour ago  2 Age:  35    Current as of about an hour ago  1 Has Diabetes:  Yes     Current as of about an hour ago  0 Had Stroke:  No  Had TIA:  No  Had Thromboembolism:  No    Current as of about an hour ago  1 Female:  Yes    Current as of about an hour ago    Deanna [name] was evaluated in Emergency Department on 03/21/2020 for the symptoms described in the history of present illness. She was evaluated in the context of the global COVID-19 pandemic, which necessitated consideration that the patient might be at risk for infection with the SARS-CoV-2 virus that causes COVID-19. Institutional protocols and algorithms that pertain to the evaluation of patients at risk for COVID-19 are in a state of rapid change based on information released by regulatory bodies including the  CDC and federal and [employer]. These policies and algorithms were followed during the patient's care in the ED.    This patient complains of shortness of breath and peripheral edema; this involves an extensive number of treatment Options and is a complaint that carries with it a high risk of complications and Morbidity. The differential includes CHF, COPD, pneumonia, pneumothorax, vascular, peripheral edema, cellulitis  I ordered,  reviewed and interpreted labs, which included CBC with normal white count, normal hemoglobin, chemistries with new AKI elevated BUN and creatinine, elevated glucose, INR therapeutic at 2.3, dig therapeutic, BNP mildly elevated, Covid testing negative, lactate normal I ordered medication IV antibiotics I ordered imaging studies which included chest x-ray and I independently    visualized and interpreted imaging which showed left lower lobe infiltrate Additional history obtained from EMS Previous records obtained and reviewed in epic, no recent admissions I consulted Dr. Roosevelt Locks [facility] hospitalist and discussed lab and imaging findings  Critical Interventions: None  After the interventions stated above, I reevaluated the patient and found patient still to be requiring oxygen.  She will need to be admitted for further work-up for hypoxia.   Final Clinical Impression(s) / ED Diagnoses Final diagnoses:  Hypoxia  Community acquired pneumonia of left lower lobe of lung  Peripheral edema    Rx / DC Orders ED Discharge Orders    None       Hayden Rasmussen, MD 03/21/20 1724

## 2020-03-21 NOTE — Procedures (Signed)
Echo attempted. Patient with pharmacy at 4:50 pm. Will attempt again as time permits.

## 2020-03-21 NOTE — ED Triage Notes (Signed)
Pt arrives via EMS from home with complaints of SOB. Pt found with SpO2 79%. Given 4 liters [place] with increase to 96. Edema noted bilaterally lower extremities.   172/76 RR 28 labored P 56 97.8 T

## 2020-03-22 ENCOUNTER — Inpatient Hospital Stay (HOSPITAL_COMMUNITY): Payer: Medicare Other

## 2020-03-22 DIAGNOSIS — I5031 Acute diastolic (congestive) heart failure: Secondary | ICD-10-CM

## 2020-03-22 DIAGNOSIS — K22719 Barrett's esophagus with dysplasia, unspecified: Secondary | ICD-10-CM

## 2020-03-22 DIAGNOSIS — M1000 Idiopathic gout, unspecified site: Secondary | ICD-10-CM

## 2020-03-22 DIAGNOSIS — I10 Essential (primary) hypertension: Secondary | ICD-10-CM

## 2020-03-22 DIAGNOSIS — J9601 Acute respiratory failure with hypoxia: Secondary | ICD-10-CM

## 2020-03-22 DIAGNOSIS — E785 Hyperlipidemia, unspecified: Secondary | ICD-10-CM

## 2020-03-22 DIAGNOSIS — J189 Pneumonia, unspecified organism: Secondary | ICD-10-CM

## 2020-03-22 LAB — BASIC METABOLIC PANEL
Anion gap: 7 (ref 5–15)
BUN: 69 mg/dL — ABNORMAL HIGH (ref 8–23)
CO2: 26 mmol/L (ref 22–32)
Calcium: 9.3 mg/dL (ref 8.9–10.3)
Chloride: 104 mmol/L (ref 98–111)
Creatinine, Ser: 2.16 mg/dL — ABNORMAL HIGH (ref 0.44–1.00)
GFR, Estimated: 21 mL/min — ABNORMAL LOW (ref >60)
Glucose, Bld: 138 mg/dL — ABNORMAL HIGH (ref 70–99)
Potassium: 5.2 mmol/L — ABNORMAL HIGH (ref 3.5–5.1)
Sodium: 137 mmol/L (ref 135–145)

## 2020-03-22 LAB — CBC
HCT: 36.2 % (ref 36.0–46.0)
Hemoglobin: 11.8 g/dL — ABNORMAL LOW (ref 12.0–15.0)
MCH: 29.8 pg (ref 26.0–34.0)
MCHC: 32.6 g/dL (ref 30.0–36.0)
MCV: 91.4 fL (ref 80.0–100.0)
Platelets: 199 10*3/uL (ref 150–400)
RBC: 3.96 MIL/uL (ref 3.87–5.11)
RDW: 17.8 % — ABNORMAL HIGH (ref 11.5–15.5)
WBC: 5 10*3/uL (ref 4.0–10.5)
nRBC: 0 % (ref 0.0–0.2)

## 2020-03-22 LAB — PROTIME-INR
INR: 2.5 — ABNORMAL HIGH (ref 0.8–1.2)
Prothrombin Time: 25.9 seconds — ABNORMAL HIGH (ref 11.4–15.2)

## 2020-03-22 LAB — ECHOCARDIOGRAM COMPLETE
AV Mean grad: 4.3 mmHg
AV Peak grad: 7.8 mmHg
Ao pk vel: 1.39 m/s
Area-P 1/2: 3.85 cm2
Height: 62 in
Weight: 2338.64 oz

## 2020-03-22 LAB — HEMOGLOBIN A1C
Hgb A1c MFr Bld: 7.5 % — ABNORMAL HIGH (ref 4.8–5.6)
Mean Plasma Glucose: 168.55 mg/dL

## 2020-03-22 MED ORDER — ORAL CARE MOUTH RINSE
15.0000 mL | Freq: Two times a day (BID) | OROMUCOSAL | Status: DC
  Administered 2020-03-22 – 2020-03-28 (×10): 15 mL via OROMUCOSAL

## 2020-03-22 MED ORDER — PANTOPRAZOLE SODIUM 40 MG PO TBEC
40.0000 mg | DELAYED_RELEASE_TABLET | Freq: Two times a day (BID) | ORAL | Status: DC
  Administered 2020-03-22 – 2020-03-28 (×13): 40 mg via ORAL
  Filled 2020-03-22 (×14): qty 1

## 2020-03-22 MED ORDER — WARFARIN SODIUM 1 MG PO TABS
1.0000 mg | ORAL_TABLET | Freq: Once | ORAL | Status: AC
  Administered 2020-03-22: 1 mg via ORAL
  Filled 2020-03-22: qty 1

## 2020-03-22 MED ORDER — SUCRALFATE 1 GM/10ML PO SUSP
1.0000 g | Freq: Three times a day (TID) | ORAL | Status: DC
  Administered 2020-03-22 – 2020-03-28 (×23): 1 g via ORAL
  Filled 2020-03-22 (×23): qty 10

## 2020-03-22 MED ORDER — ALPRAZOLAM 0.5 MG PO TABS
0.5000 mg | ORAL_TABLET | Freq: Three times a day (TID) | ORAL | Status: DC | PRN
  Administered 2020-03-22 – 2020-03-28 (×12): 0.5 mg via ORAL
  Filled 2020-03-22 (×13): qty 1

## 2020-03-22 NOTE — Evaluation (Signed)
Physical Therapy Evaluation Patient Details Name: Deanna [name] MRN: [id number] DOB: 09-30-1926 Today's Date: 03/22/2020   History of Present Illness  Patient is a 87 y/o female who presents on 03/21/20 with SOB and BLE edema. Admitted with acute respiratory failure secondary to acute on chronic diastolic CHF decompensation likely due to uncontrolled HTN and under diruesis. PMH includes HTN, breast ca, A-fib, tricuspid regurgitation.  Clinical Impression  Patient presents with dyspnea at rest, worsened with exertion, decreased activity tolerance, generalized weakness, pain, impaired balance and impaired mobility s/p above. Pt lives in a [place] and reports being Mod I with rollator and ADls PTA. Pt walks to dining hall for most meals and reports no falls. Today, pt requires Min A for bed mobility and min guard assist for transfers and short distance ambulation with use of RW for support. Sp02 stayed >89% on 5L/min 02 [place] with activity with 3/4 DOE. Pt HR 49-70s bpm during session. Pt fatigues quickly and difficulty donning socks sitting EOB. Encouraged OOB to chair for all meals and walking to bathroom with nursing as tolerated. Will follow acutely to maximize independence and mobility prior to return home.     Follow Up Recommendations Home health PT;Supervision - Intermittent (pending progress)    Equipment Recommendations  None recommended by PT    Recommendations for Other Services       Precautions / Restrictions Precautions Precautions: Fall;Other (comment) Precaution Comments: watch 02, HR (bradycardia) Restrictions Weight Bearing Restrictions: No      Mobility  Bed Mobility Overal bed mobility: Needs Assistance Bed Mobility: Supine to Sit     Supine to sit: Min assist;HOB elevated     General bed mobility comments: Assist with trunk to get to EOB, increased time. DOE.    Transfers Overall transfer level: Needs assistance Equipment used:  Rolling walker (2 wheeled) Transfers: Sit to/from Stand Sit to Stand: Min guard;Min assist         General transfer comment: Assist to power to standing with use of momentum and multiple attempts to get off bed; Stood from EOB x2, transferred to chair post ambulation.  Ambulation/Gait Ambulation/Gait assistance: Min guard Gait Distance (Feet): 20 Feet (+ 15') Assistive device: Rolling walker (2 wheeled) Gait Pattern/deviations: Step-through pattern;Decreased stride length;Trunk flexed Gait velocity: decreased   General Gait Details: Slow, mildly unsteady gait with RW for support; pain in right trunk with mobility. Sp02 dropped to 89% on 5L/min 02 [place], 3/4 DOE. 1 seated rest break. Cues for pursed lip breathing.  Stairs            Wheelchair Mobility    Modified Rankin (Stroke Patients Only)       Balance Overall balance assessment: Needs assistance Sitting-balance support: Feet supported;No upper extremity supported Sitting balance-Leahy Scale: Good Sitting balance - Comments: Difficulty donning socks needing assist due to worsening SOB but no LOB with reaching outside BOS.   Standing balance support: During functional activity Standing balance-Leahy Scale: Poor Standing balance comment: Requires UE support in standing.                             Pertinent Vitals/Pain Pain Assessment: Faces Faces Pain Scale: Hurts even more Pain Location: throat and right side of trunk with mobility Pain Descriptors / Indicators: Sore;Aching;Guarding;Grimacing Pain Intervention(s): Monitored during session;Repositioned;Limited activity within patient's tolerance    Home Living Family/patient expects to be discharged to:: Other (Comment) ([place]) Living Arrangements:  Alone   Type of Home: Apartment Home Access: Elevator     Home Layout: One level Home Equipment: Walker - 4 wheels;Shower seat;Other (comment) Additional Comments:  lift chair    Prior Function Level of Independence: Independent with assistive device(s)         Comments: Uses rollator for ambulation. Does ADLs, minimal IADLs (putting some stuff in microwave). No falls reported. Walks to [employer].     Hand Dominance   Dominant Hand: Left    Extremity/Trunk Assessment   Upper Extremity Assessment Upper Extremity Assessment: Defer to OT evaluation    Lower Extremity Assessment Lower Extremity Assessment: RLE deficits/detail;LLE deficits/detail;Generalized weakness RLE Deficits / Details: Some tingling at times in foot/LE, grossly ~4/5 throughout LLE Deficits / Details: Some tingling at times in foot/LE, grossly ~4/5 throughout    Cervical / Trunk Assessment Cervical / Trunk Assessment: Kyphotic  Communication   Communication: HOH  Cognition Arousal/Alertness: Awake/alert Behavior During Therapy: WFL for tasks assessed/performed Overall Cognitive Status: Within Functional Limits for tasks assessed                                 General Comments: requires repetition due to [place] comments (skin integrity, edema, etc.): Daughter and granddaughter present during session. Sp02 stayed >89% on 5L/min 02 [place] with 3/4 DOE. Pt HR 49-70s bpm with activity.    Exercises     Assessment/Plan    PT Assessment Patient needs continued PT services  PT Problem List Decreased strength;Decreased mobility;Cardiopulmonary status limiting activity;Decreased skin integrity;Decreased activity tolerance;Pain       PT Treatment Interventions Therapeutic exercise;Gait training;Balance training;Therapeutic activities;Patient/family education;Functional mobility training    PT Goals (Current goals can be found in the Care Plan section)  Acute Rehab PT Goals Patient Stated Goal: to return to PLOF PT Goal Formulation: With patient Time For Goal Achievement: 04/05/20 Potential to Achieve Goals: Good     Frequency Min 3X/week   Barriers to discharge Decreased caregiver support      Co-evaluation               AM-PAC PT "6 Clicks" Mobility  Outcome Measure Help needed turning from your back to your side while in a flat bed without using bedrails?: A Little Help needed moving from lying on your back to sitting on the side of a flat bed without using bedrails?: A Little Help needed moving to and from a bed to a chair (including a wheelchair)?: A Little Help needed standing up from a chair using your arms (e.g., wheelchair or bedside chair)?: A Little Help needed to walk in hospital room?: A Little Help needed climbing 3-5 steps with a railing? : A Little 6 Click Score: 18    End of Session Equipment Utilized During Treatment: Oxygen;Gait belt Activity Tolerance: Patient limited by fatigue Patient left: in chair;with call bell/phone within reach;with chair alarm set;with family/visitor present Nurse Communication: Mobility status PT Visit Diagnosis: Pain;Muscle weakness (generalized) (M62.81);Difficulty in walking, not elsewhere classified (R26.2);Other (comment) (cardiopulmonary deficits) Pain - Right/Left: Right Pain - part of body:  (trunk, throat)    Time: 6568-1275 PT Time Calculation (min) (ACUTE ONLY): 28 min   Charges:   PT Evaluation $PT Eval Moderate Complexity: 1 Mod PT Treatments $Gait Training: 8-22 mins        Marisa Severin, PT, DPT Acute Rehabilitation Services Pager [phone] Office [phone]  Marguarite Arbour A [place] 03/22/2020, 10:07 AM

## 2020-03-22 NOTE — Progress Notes (Signed)
OT Cancellation Note  Patient Details Name: Deanna [name] MRN: [id number] DOB: 1926/07/07   Cancelled Treatment:    Reason Eval/Treat Not Completed: Other (comment) (Pt declined as pt had just gotten back to bed and wanted to rest. Family in room confirmed that she is due for a nap and pt reported to not have slept last night. OT will re-attempt later today or next available treatment day.)   Jefferey Pica, OTR/[facility] [place] Pager: [phone] Office: [phone]   [name] C 03/22/2020, 11:28 AM

## 2020-03-22 NOTE — Progress Notes (Signed)
ANTICOAGULATION CONSULT NOTE - Follow Up Consult  Pharmacy Consult for Warfarin Indication: atrial fibrillation  Allergies  Allergen Reactions  . Aspirin Other (See Comments)    REACTION: nervousness---tolerates ibuprofen  . Atorvastatin Other (See Comments)    myalgia    Patient Measurements: Height: 5\' 2"  (157.5 cm) Weight: 66.3 kg (146 lb 2.6 oz) IBW/kg (Calculated) : 50.1  Vital Signs: Temp: 98.2 F (36.8 C) (03/15 0600) Temp Source: Oral (03/15 0600) BP: 90/46 (03/15 0600) Pulse Rate: 49 (03/15 0600)  Labs: Recent Labs    03/21/20 1252 03/21/20 1427 03/22/20 0301  HGB 12.4  --  11.8*  HCT 39.7  --  36.2  PLT 223  --  199  LABPROT 24.4*  --  25.9*  INR 2.3*  --  2.5*  CREATININE 1.93*  --  2.16*  TROPONINIHS 41* 45*  --     Estimated Creatinine Clearance: 15.2 mL/min (A) (by C-G formula based on SCr of 2.16 mg/dL (H)).  Assessment:  Pt is a 76 YOF with history of Afib on warfarin PTA presented 03/21/20 with acute CHF. Pharmacy consulted for Warfarin dosing.    Per patient, INR has recently been high and regimen has been changing frequently. Pt reports latest regimen was 1 alternating with 2 mg, and 1 mg taken PTA on 03/20/20.     INR therapeutic (2.3) on admission and remains therapeutic (2.5) after usual dose of 2 mg on 3/14.  Azithromycin 500 mg IV x 1 given 3/14, sometimes effects INR. CBC about the same.   Goal of Therapy:  INR 2-3 Monitor platelets by anticoagulation protocol: Yes   Plan:   Warfarin 1 mg x 1 tonight, usual dose.  Daily PT/INR and CBC for now.   Arty Baumgartner, RPh 03/22/2020,9:39 AM

## 2020-03-22 NOTE — Progress Notes (Addendum)
PROGRESS NOTE    Deanna [name]  KDT:[id number] DOB: September 27, 1926 DOA: 03/21/2020 PCP: Lajean Manes, MD    Brief Narrative:  [name] was admitted to the hospital with a working diagnosis of acute diastolic heart failure exacerbation, complicated with acute pneumonitis and acute hypoxemic respiratory failure.   87 year old female with hypertension, diastolic heart failure, paroxysmal atrial fibrillation, dyslipidemia, osteoarthritis and history of breast cancer status post mastectomy.  Patient reported intermittent dyspnea for about 7 days, associated with orthopnea and cough.  Reported poorly controlled hypertension for the last 2 months.  On her initial physical examination blood pressure 121/49, heart rate 48-53, respiratory rate 20, oxygen saturation 96%.  Positive rales bilaterally, increased work of breathing, heart S1-S2, present, rhythmic, positive systolic murmur at the apex, abdomen soft nontender, positive significant lower extremity edema.   Sodium 137, potassium 5.1, chloride 105, bicarb 23, glucose 172, BUN 64, creatinine 1.93, BNP 150, troponin I 41-45, white count 6.6, hemoglobin 12.4, hematocrit 39.7, platelets 223. SARS COVID-19 negative.  Chest radiograph with cardiomegaly, left lower lobe atelectasis, left lung volume. CT chest with no pericardial effusion, large hiatal hernia with entire stomach up in the chest along with part of the transverse colon.  Adjacent atelectasis to the large hiatal hernia sac.  EKG 52 bpm, normal axis, right bundle branch block, no ST segment changes,  Negative T wave lead II, III and AVf, V4-V3-V4-V5.   Assessment & Plan:   Principal Problem:   Acute respiratory failure with hypoxia (HCC) Active Problems:   Hyperlipidemia   Gout   Essential hypertension   ATRIAL FIBRILLATION   Barrett's esophagus   Acute CHF (congestive heart failure) (HCC)   Pneumonitis   1. Acute hypoxemic respiratory failure, combination of cardiogenic pulmonary  edema (decomepensated chronic diastolic heart failure) and pneumonitis due to aspiration.  Patient with large hiatal hernia and risk for aspiration and aspiration pneumonitis. Her volume status has improved, documented urine output over last 24 hrs is 800, and blood pressure has been low with systolic 90 to 998 mmHg.  Continue on supplemental 02, currently on 4 l/min per [place] with oxygen saturation at  99%.   Plan to hold on furosemide and antihypertensive agents for now, continue supplemental 02 per Shambaugh and continue with aspiration precautions. Consult speech therapy and increase PPI to bid, add sucralfate.  If worsening hypoxemia consider repeating chest film, for now hold on antibiotic therapy, no signs of bacterial pneumonia.   2. Paroxysmal atrial fibrillation. Continue rate control with digoxin. Anticoagulation with warfarin.  Continue telemetry monitoring.  3. HTN. Patient with hypotension this am, will hold on hydralazine and isorbide for now.  Continue blood pressure monitoring.   4. AKI on CKD stage 3a/ hyperkalemia. Renal function with worsening cr up to 2,16 with K at 5,2 and serum bicarbonate at 26. Hold on furosemide for now, avoid hypotension and nephrotoxic medications.  Follow up on renal function in am.   5. Dyslipidemia. Continue with pravastatin and fenofibrate.   6. Gout. No acute flare.  7. Anxiety. Continue as needed alprazolam.  Consult PT and OT, nutrition evaluation.   Patient continue to be at high risk for worsening respiratory failure   Status is: Inpatient  Remains inpatient appropriate because:Inpatient level of care appropriate due to severity of illness   Dispo: The patient is from: Home              Anticipated d/c is to: Home  Patient currently is not medically stable to d/c.   Difficult to place patient No   DVT prophylaxis: Warfarin   Code Status:   DNR   Family Communication:  I spoke with patient's daughter and granddaughter at  the bedside, we talked in detail about patient's condition, plan of care and prognosis and all questions were addressed.     Subjective: Patient with persistent dyspnea and cough, positive difficulty swallowing, no nausea or vomiting, no chest pain. At home uses walker for ambulation.   Objective: Vitals:   03/22/20 0038 03/22/20 0150 03/22/20 0600 03/22/20 1009  BP:   (!) 90/46 (!) 124/50  Pulse:   (!) 49 (!) 51  Resp:   16 20  Temp:   98.2 F (36.8 C) 97.8 F (36.6 C)  TempSrc:   Oral Oral  SpO2:   100%   Weight: 66.3 kg 66.3 kg    Height:        Intake/Output Summary (Last 24 hours) at 03/22/2020 1116 Last data filed at 03/22/2020 0946 Gross per 24 hour  Intake 240 ml  Output 1000 ml  Net -760 ml   Filed Weights   03/21/20 2034 03/22/20 0038 03/22/20 0150  Weight: 66.6 kg 66.3 kg 66.3 kg    Examination:   General: deconditioned and ill looking appearing  Neurology: Awake and alert, non focal  E ENT: no pallor, no icterus, oral mucosa moist Cardiovascular: No JVD. S1-S2 present, rhythmic, no gallops, rubs, or murmurs. trace lower extremity edema. Pulmonary: positive breath sounds bilaterally, with no wheezing, rhonchi no significant rales. Gastrointestinal. Abdomen mild distended but not tender Skin. No rashes Musculoskeletal: no joint deformities     Data Reviewed: I have personally reviewed following labs and imaging studies  CBC: Recent Labs  Lab 03/21/20 1252 03/22/20 0301  WBC 6.6 5.0  NEUTROABS 4.4  --   HGB 12.4 11.8*  HCT 39.7 36.2  MCV 91.9 91.4  PLT 223 998   Basic Metabolic Panel: Recent Labs  Lab 03/21/20 1252 03/22/20 0301  NA 137 137  K 5.1 5.2*  CL 105 104  CO2 23 26  GLUCOSE 172* 138*  BUN 64* 69*  CREATININE 1.93* 2.16*  CALCIUM 9.6 9.3   GFR: Estimated Creatinine Clearance: 15.2 mL/min (A) (by C-G formula based on SCr of 2.16 mg/dL (H)). Liver Function Tests: Recent Labs  Lab 03/21/20 1252  AST 32  ALT 19  ALKPHOS  60  BILITOT 1.7*  PROT 6.8  ALBUMIN 3.4*   No results for input(s): LIPASE, AMYLASE in the last 168 hours. No results for input(s): AMMONIA in the last 168 hours. Coagulation Profile: Recent Labs  Lab 03/21/20 1252 03/22/20 0301  INR 2.3* 2.5*   Cardiac Enzymes: No results for input(s): CKTOTAL, CKMB, CKMBINDEX, TROPONINI in the last 168 hours. BNP (last 3 results) No results for input(s): PROBNP in the last 8760 hours. HbA1C: Recent Labs    03/22/20 0301  HGBA1C 7.5*   CBG: No results for input(s): GLUCAP in the last 168 hours. Lipid Profile: No results for input(s): CHOL, HDL, LDLCALC, TRIG, CHOLHDL, LDLDIRECT in the last 72 hours. Thyroid Function Tests: No results for input(s): TSH, T4TOTAL, FREET4, T3FREE, THYROIDAB in the last 72 hours. Anemia Panel: No results for input(s): VITAMINB12, FOLATE, FERRITIN, TIBC, IRON, RETICCTPCT in the last 72 hours.    Radiology Studies: I have reviewed all of the imaging during this hospital visit personally     Scheduled Meds: . allopurinol  100 mg Oral Daily  .  ALPRAZolam  0.5 mg Oral QHS  . digoxin  0.0625 mg Oral Daily  . fenofibrate  160 mg Oral Daily  . furosemide  40 mg Intravenous Daily  . hydrALAZINE  50 mg Oral BID  . isosorbide mononitrate  30 mg Oral Daily  . mouth rinse  15 mL Mouth Rinse BID  . pantoprazole  40 mg Oral Daily  . pravastatin  40 mg Oral Daily  . sodium chloride flush  3 mL Intravenous Q12H  . warfarin  1 mg Oral ONCE-1600  . Warfarin - Pharmacist Dosing Inpatient   Does not apply q1600   Continuous Infusions: . sodium chloride       LOS: 1 day        Mauricio Gerome Apley, MD

## 2020-03-22 NOTE — Progress Notes (Signed)
  Echocardiogram 2D Echocardiogram has been performed.  Deanna [name] 03/22/2020, 1:58 PM

## 2020-03-23 ENCOUNTER — Inpatient Hospital Stay (HOSPITAL_COMMUNITY): Payer: Medicare Other

## 2020-03-23 DIAGNOSIS — E782 Mixed hyperlipidemia: Secondary | ICD-10-CM

## 2020-03-23 DIAGNOSIS — I482 Chronic atrial fibrillation, unspecified: Secondary | ICD-10-CM

## 2020-03-23 LAB — CBC
HCT: 37.2 % (ref 36.0–46.0)
Hemoglobin: 12.1 g/dL (ref 12.0–15.0)
MCH: 29.5 pg (ref 26.0–34.0)
MCHC: 32.5 g/dL (ref 30.0–36.0)
MCV: 90.7 fL (ref 80.0–100.0)
Platelets: 203 10*3/uL (ref 150–400)
RBC: 4.1 MIL/uL (ref 3.87–5.11)
RDW: 17.7 % — ABNORMAL HIGH (ref 11.5–15.5)
WBC: 5.3 10*3/uL (ref 4.0–10.5)
nRBC: 0 % (ref 0.0–0.2)

## 2020-03-23 LAB — BASIC METABOLIC PANEL
Anion gap: 9 (ref 5–15)
BUN: 68 mg/dL — ABNORMAL HIGH (ref 8–23)
CO2: 27 mmol/L (ref 22–32)
Calcium: 9.3 mg/dL (ref 8.9–10.3)
Chloride: 99 mmol/L (ref 98–111)
Creatinine, Ser: 2.11 mg/dL — ABNORMAL HIGH (ref 0.44–1.00)
GFR, Estimated: 22 mL/min — ABNORMAL LOW (ref >60)
Glucose, Bld: 106 mg/dL — ABNORMAL HIGH (ref 70–99)
Potassium: 4.8 mmol/L (ref 3.5–5.1)
Sodium: 135 mmol/L (ref 135–145)

## 2020-03-23 LAB — PROTIME-INR
INR: 2.4 — ABNORMAL HIGH (ref 0.8–1.2)
Prothrombin Time: 25.6 seconds — ABNORMAL HIGH (ref 11.4–15.2)

## 2020-03-23 LAB — BRAIN NATRIURETIC PEPTIDE: B Natriuretic Peptide: 192.7 pg/mL — ABNORMAL HIGH (ref 0.0–100.0)

## 2020-03-23 MED ORDER — WARFARIN SODIUM 2 MG PO TABS
2.0000 mg | ORAL_TABLET | Freq: Once | ORAL | Status: AC
  Administered 2020-03-23: 2 mg via ORAL
  Filled 2020-03-23: qty 1

## 2020-03-23 MED ORDER — ADULT MULTIVITAMIN W/MINERALS CH
1.0000 | ORAL_TABLET | Freq: Every day | ORAL | Status: DC
  Administered 2020-03-23 – 2020-03-28 (×6): 1 via ORAL
  Filled 2020-03-23 (×6): qty 1

## 2020-03-23 MED ORDER — DIPHENHYDRAMINE HCL 25 MG PO CAPS
25.0000 mg | ORAL_CAPSULE | Freq: Every evening | ORAL | Status: DC | PRN
  Administered 2020-03-23 – 2020-03-27 (×3): 25 mg via ORAL
  Filled 2020-03-23 (×3): qty 1

## 2020-03-23 NOTE — Plan of Care (Signed)
  Problem: Health Behavior/Discharge Planning: Goal: Ability to manage health-related needs will improve Outcome: Progressing   Problem: Clinical Measurements: Goal: Ability to maintain clinical measurements within normal limits will improve Outcome: Progressing   Problem: Clinical Measurements: Goal: Will remain free from infection Outcome: Progressing   

## 2020-03-23 NOTE — Evaluation (Signed)
Occupational Therapy Evaluation Patient Details Name: Deanna [name] MRN: [id number] DOB: Feb 15, 1926 Today's Date: 03/23/2020    History of Present Illness Patient is a 87 y/o female who presents on 03/21/20 with SOB and BLE edema. Admitted with acute respiratory failure secondary to acute on chronic diastolic CHF decompensation likely due to uncontrolled HTN and under diruesis. PMH includes HTN, breast ca, A-fib, tricuspid regurgitation.   Clinical Impression   Patient admitted for the above diagnosis.  PTA she was Mod I for ADL from sit/stand level, and Mod I with RW for mobility.  Patient deficits impacting independence are listed below.  Currently patient session limited as transport entering room for scheduled MBS this date.  OT will continue to assess out of bed and ADL independence for eventual transition home to ALF.  Home Health is being recommended, a better determination will be done as ADL is further assess, and she is more alert.  Patient was given medication to help her relax for upcoming MBS, and she was found sleeping quite soundly.      Follow Up Recommendations  Home health OT    Equipment Recommendations  None recommended by OT    Recommendations for Other Services       Precautions / Restrictions Precautions Precautions: Fall Restrictions Weight Bearing Restrictions: No      Mobility Bed Mobility Overal bed mobility: Needs Assistance Bed Mobility: Supine to Sit;Sit to Supine     Supine to sit: Min assist Sit to supine: Mod assist   General bed mobility comments: Increased time and effort - per daughter, patient given meds for anxiety for upcoming MBS Patient Response: Flat affect  Transfers                 General transfer comment: not assessed - transport in room for MBS    Balance Overall balance assessment: Needs assistance Sitting-balance support: Feet supported;Bilateral upper extremity supported Sitting balance-Leahy Scale: [place] Patient Visual Report: No change from baseline       Perception     Praxis      Pertinent Vitals/Pain Pain Assessment: Faces Faces Pain Scale: No hurt Pain Intervention(s): Monitored during session     Hand Dominance Left   Extremity/Trunk Assessment Upper Extremity Assessment Upper Extremity Assessment: Generalized weakness   Lower Extremity Assessment Lower Extremity Assessment: Defer to PT evaluation   Cervical / Trunk Assessment Cervical / Trunk Assessment: Kyphotic   Communication Communication Communication: HOH   Cognition Arousal/Alertness: Lethargic;Suspect due to medications Behavior During Therapy: [facility] for tasks assessed/performed Overall Cognitive Status: Within Functional Limits for tasks assessed                                                      Home Living Family/patient expects to be discharged to:: Private residence Living Arrangements: Alone Available Help at Discharge: Family;Available PRN/intermittently Type of Home: Apartment Home Access: Elevator     Home Layout: One level     Bathroom Shower/Tub: Occupational psychologist: Handicapped height Bathroom Accessibility: Yes How Accessible: Accessible via wheelchair Home Equipment: [place] - 4 wheels;Shower  seat;Other (comment)          Prior Functioning/Environment Level of Independence: Independent with assistive device(s)        Comments: Per daughter: PT Note: Uses rollator for ambulation. Does ADLs, minimal IADLs (putting some stuff in microwave). No falls reported. Walks to [employer].        OT Problem List: Decreased strength;Decreased activity tolerance;Impaired balance (sitting and/or standing)      OT Treatment/Interventions: Self-care/ADL training;Therapeutic exercise;Energy conservation;DME and/or AE instruction;Balance training;Patient/family education;Therapeutic  activities    OT Goals(Current goals can be found in the care plan section) Acute Rehab OT Goals Patient Stated Goal: Wanting to get this test over, hoping to go home soon Time For Goal Achievement: 04/06/20 Potential to Achieve Goals: Good  OT Frequency: Min 2X/week   Barriers to D/C:   None noted         Co-evaluation              AM-PAC OT "6 Clicks" Daily Activity     Outcome Measure Help from another person eating meals?: A Little Help from another person taking care of personal grooming?: A Little Help from another person toileting, which includes using toliet, bedpan, or urinal?: A Little Help from another person bathing (including washing, rinsing, drying)?: A Little Help from another person to put on and taking off regular upper body clothing?: A Little Help from another person to put on and taking off regular lower body clothing?: A Little 6 Click Score: 18   End of Session    Activity Tolerance: Patient limited by lethargy Patient left: in bed;Other (comment) (transport to room for MBS)  OT Visit Diagnosis: Unsteadiness on feet (R26.81);Muscle weakness (generalized) (M62.81)                Time: 9983-3825 OT Time Calculation (min): 16 min Charges:  OT General Charges $OT Visit: 1 Visit OT Evaluation $OT Eval Moderate Complexity: 1 Mod  03/23/2020  Rich, OTR/L  Acute Rehabilitation Services  Office:  [place] 03/23/2020, 2:26 PM

## 2020-03-23 NOTE — Progress Notes (Signed)
   03/23/20 0908  Mobility  Activity Refused mobility (Pt was asleep. Family asked to return later)

## 2020-03-23 NOTE — Progress Notes (Signed)
ANTICOAGULATION CONSULT NOTE - Follow Up Consult  Pharmacy Consult for Warfarin Indication: atrial fibrillation  Allergies  Allergen Reactions  . Aspirin Other (See Comments)    REACTION: nervousness---tolerates ibuprofen  . Atorvastatin Other (See Comments)    myalgia    Patient Measurements: Height: 5\' 2"  (157.5 cm) Weight: 65.8 kg (145 lb 1.6 oz) IBW/kg (Calculated) : 50.1  Vital Signs: Temp: 97.5 F (36.4 C) (03/16 0252) Temp Source: Oral (03/16 0252) BP: 122/56 (03/16 0808) Pulse Rate: 51 (03/16 0808)  Labs: Recent Labs    03/21/20 1252 03/21/20 1427 03/22/20 0301 03/23/20 0404  HGB 12.4  --  11.8* 12.1  HCT 39.7  --  36.2 37.2  PLT 223  --  199 203  LABPROT 24.4*  --  25.9* 25.6*  INR 2.3*  --  2.5* 2.4*  CREATININE 1.93*  --  2.16* 2.11*  TROPONINIHS 41* 45*  --   --     Estimated Creatinine Clearance: 15.5 mL/min (A) (by C-G formula based on SCr of 2.11 mg/dL (H)).  Assessment:  Pt is a 89 YOF with history of Afib on warfarin PTA presented 03/21/20 with acute CHF. Pharmacy consulted for Warfarin dosing.    Per patient, INR has recently been high and regimen has been changing frequently. Pt reports latest regimen was 1 alternating with 2 mg, and 1 mg taken PTA on 03/20/20.     INR therapeutic (2.3) on admission and remains therapeutic (2.4) after usual doses on 3/14 and 3/15.  Azithromycin 500 mg IV x 1 given 3/14, sometimes effects INR. CBC stable.  Goal of Therapy:  INR 2-3 Monitor platelets by anticoagulation protocol: Yes   Plan:   Warfarin 2 mg x 1 today, usual dose.  Daily PT/INR for now.  Next CBC 3/18.  Arty Baumgartner, RPh 03/23/2020,9:20 AM

## 2020-03-23 NOTE — Discharge Instructions (Signed)

## 2020-03-23 NOTE — Progress Notes (Signed)
PROGRESS NOTE    Deanna [name]  KWI:[id number] DOB: [birth year] DOA: 03/21/2020 PCP: Lajean Manes, MD    Brief Narrative:  [name] was admitted to the hospital with a working diagnosis of acute diastolic heart failure exacerbation, complicated with acute pneumonitis and acute hypoxemic respiratory failure.   87 year old female with hypertension, diastolic heart failure, paroxysmal atrial fibrillation, dyslipidemia, osteoarthritis and history of breast cancer status post mastectomy.  Patient reported intermittent dyspnea for about 7 days, associated with orthopnea and cough.  Reported poorly controlled hypertension for the last 2 months.  On her initial physical examination blood pressure 121/49, heart rate 48-53, respiratory rate 20, oxygen saturation 96%.  Positive rales bilaterally, increased work of breathing, heart S1-S2, present, rhythmic, positive systolic murmur at the apex, abdomen soft nontender, positive significant lower extremity edema.   Sodium 137, potassium 5.1, chloride 105, bicarb 23, glucose 172, BUN 64, creatinine 1.93, BNP 150, troponin I 41-45, white count 6.6, hemoglobin 12.4, hematocrit 39.7, platelets 223. SARS COVID-19 negative.  Chest radiograph with cardiomegaly, left lower lobe atelectasis, left lung volume. CT chest with no pericardial effusion, large hiatal hernia with entire stomach up in the chest along with part of the transverse colon.  Adjacent atelectasis to the large hiatal hernia sac.  EKG 52 bpm, normal axis, right bundle branch block, no ST segment changes,  Negative T wave lead II, III and AVf, V4-V3-V4-V5.   3/16-sob little better today. Daughter at bedside.   Consultants:     Procedures:  1. Left ventricular ejection fraction, by estimation, is 55 to 60%. The  left ventricle has normal function. The left ventricle has no regional  wall motion abnormalities. Left ventricular diastolic function could not  be evaluated. There is the   interventricular septum is flattened in diastole ('D' shaped left  ventricle), consistent with right ventricular volume overload.  2. Right ventricular systolic function is severely reduced. The right  ventricular size is severely enlarged. There is mildly elevated pulmonary  artery systolic pressure. The estimated right ventricular systolic  pressure is 24.2 mmHg.  3. Left atrial size was severely dilated.  4. Right atrial size was giant.  5. The mitral valve is degenerative. Mild mitral valve regurgitation.  Moderate mitral annular calcification.  6. Severely dilated tricuspid annulus with leaflet malcoaptation and  torrential regurgitation. The tricuspid valve is abnormal. Tricuspid valve  regurgitation wide-open.  7. The aortic valve is grossly normal. Aortic valve regurgitation is  trivial. Mild to moderate aortic valve sclerosis/calcification is present,  without any evidence of aortic stenosis.  8. The inferior vena cava is dilated in size with <50% respiratory  variability, suggesting right atrial pressure of 15 mmHg.   Comparison(s): Prior images unable to be directly viewed, comparison made  by report only. The right ventricular hypertrophy is significantly worse.   Antimicrobials:       Subjective: Had mild discomfort when going to the bathroom. +bm.  Good UO. Breathing a little better  Objective: Vitals:   03/22/20 1650 03/22/20 1920 03/23/20 0252 03/23/20 0808  BP: (!) 121/49 (!) 118/48 (!) 130/51 (!) 122/56  Pulse: (!) 52 (!) 48 (!) 46 (!) 51  Resp: 18 17 18    Temp: 98.4 F (36.9 C) 98.1 F (36.7 C) (!) 97.5 F (36.4 C)   TempSrc: Oral Oral Oral   SpO2: 99% 97% 96%   Weight:   65.8 kg   Height:        Intake/Output Summary (Last 24 hours) at 03/23/2020 1037 Last  data filed at 03/23/2020 0800 Gross per 24 hour  Intake 476 ml  Output 1000 ml  Net -524 ml   Filed Weights   03/22/20 0038 03/22/20 0150 03/23/20 0252  Weight: 66.3 kg 66.3 kg 65.8  kg    Examination:  General exam: Appears calm and comfortable  Respiratory system: mild decrease bs at bases, no wheezing Cardiovascular system: S1 & S2 heard, RRR. No JVD, murmurs, rubs, gallops or clicks.  Gastrointestinal system: Abdomen is nondistended, soft and nontended Normal bowel sounds heard. Central nervous system: Alert and oriented, grossly intact Extremities: no edema Skin: warm, dry Psychiatry:  Mood & affect appropriate in current setting.     Data Reviewed: I have personally reviewed following labs and imaging studies  CBC: Recent Labs  Lab 03/21/20 1252 03/22/20 0301 03/23/20 0404  WBC 6.6 5.0 5.3  NEUTROABS 4.4  --   --   HGB 12.4 11.8* 12.1  HCT 39.7 36.2 37.2  MCV 91.9 91.4 90.7  PLT 223 199 237   Basic Metabolic Panel: Recent Labs  Lab 03/21/20 1252 03/22/20 0301 03/23/20 0404  NA 137 137 135  K 5.1 5.2* 4.8  CL 105 104 99  CO2 23 26 27   GLUCOSE 172* 138* 106*  BUN 64* 69* 68*  CREATININE 1.93* 2.16* 2.11*  CALCIUM 9.6 9.3 9.3   GFR: Estimated Creatinine Clearance: 15.5 mL/min (A) (by C-G formula based on SCr of 2.11 mg/dL (H)). Liver Function Tests: Recent Labs  Lab 03/21/20 1252  AST 32  ALT 19  ALKPHOS 60  BILITOT 1.7*  PROT 6.8  ALBUMIN 3.4*   No results for input(s): LIPASE, AMYLASE in the last 168 hours. No results for input(s): AMMONIA in the last 168 hours. Coagulation Profile: Recent Labs  Lab 03/21/20 1252 03/22/20 0301 03/23/20 0404  INR 2.3* 2.5* 2.4*   Cardiac Enzymes: No results for input(s): CKTOTAL, CKMB, CKMBINDEX, TROPONINI in the last 168 hours. BNP (last 3 results) No results for input(s): PROBNP in the last 8760 hours. HbA1C: Recent Labs    03/22/20 0301  HGBA1C 7.5*   CBG: No results for input(s): GLUCAP in the last 168 hours. Lipid Profile: No results for input(s): CHOL, HDL, LDLCALC, TRIG, CHOLHDL, LDLDIRECT in the last 72 hours. Thyroid Function Tests: No results for input(s): TSH,  T4TOTAL, FREET4, T3FREE, THYROIDAB in the last 72 hours. Anemia Panel: No results for input(s): VITAMINB12, FOLATE, FERRITIN, TIBC, IRON, RETICCTPCT in the last 72 hours. Sepsis Labs: Recent Labs  Lab 03/21/20 1427  LATICACIDVEN 0.9    Recent Results (from the past 240 hour(s))  Resp Panel by RT-PCR (Flu A&B, Covid) Nasopharyngeal Swab     Status: None   Collection Time: 03/21/20 12:54 PM   Specimen: Nasopharyngeal Swab; Nasopharyngeal(NP) swabs in vial transport medium  Result Value Ref Range Status   SARS Coronavirus 2 by RT PCR NEGATIVE NEGATIVE Final    Comment: (NOTE) SARS-CoV-2 target nucleic acids are NOT DETECTED.  The SARS-CoV-2 RNA is generally detectable in upper respiratory specimens during the acute phase of infection. The lowest concentration of SARS-CoV-2 viral copies this assay can detect is 138 copies/mL. A negative result does not preclude SARS-Cov-2 infection and should not be used as the sole basis for treatment or other patient management decisions. A negative result may occur with  improper specimen collection/handling, submission of specimen other than nasopharyngeal swab, presence of viral mutation(s) within the areas targeted by this assay, and inadequate number of viral copies(<138 copies/mL). A negative result must  be combined with clinical observations, patient history, and epidemiological information. The expected result is Negative.  Fact Sheet for Patients:  EntrepreneurPulse.com.au  Fact Sheet for Healthcare Providers:  IncredibleEmployment.be  This test is no t yet approved or cleared by the [place] FDA and  has been authorized for detection and/or diagnosis of SARS-CoV-2 by FDA under an Emergency Use Authorization (EUA). This EUA will remain  in effect (meaning this test can be used) for the duration of the COVID-19 declaration under Section 564(b)(1) of the Act, 21 U.S.C.section 360bbb-3(b)(1),  unless the authorization is terminated  or revoked sooner.       Influenza A by PCR NEGATIVE NEGATIVE Final   Influenza B by PCR NEGATIVE NEGATIVE Final    Comment: (NOTE) The Xpert Xpress SARS-CoV-2/FLU/RSV plus assay is intended as an aid in the diagnosis of influenza from Nasopharyngeal swab specimens and should not be used as a sole basis for treatment. Nasal washings and aspirates are unacceptable for Xpert Xpress SARS-CoV-2/FLU/RSV testing.  Fact Sheet for Patients: EntrepreneurPulse.com.au  Fact Sheet for Healthcare Providers: IncredibleEmployment.be  This test is not yet approved or cleared by the [place] FDA and has been authorized for detection and/or diagnosis of SARS-CoV-2 by FDA under an Emergency Use Authorization (EUA). This EUA will remain in effect (meaning this test can be used) for the duration of the COVID-19 declaration under Section 564(b)(1) of the Act, 21 U.S.C. section 360bbb-3(b)(1), unless the authorization is terminated or revoked.  Performed at [facility], [place] [address], [place], [place] [address]   Culture, blood (routine x 2)     Status: None (Preliminary result)   Collection Time: 03/21/20  2:27 PM   Specimen: BLOOD RIGHT ARM  Result Value Ref Range Status   Specimen Description BLOOD RIGHT ARM  Final   Special Requests   Final    BOTTLES DRAWN AEROBIC AND ANAEROBIC Blood Culture adequate volume   Culture   Final    NO GROWTH 2 DAYS Performed at [facility], [place] [address], [place], [place] [address]    Report Status PENDING  Incomplete  Culture, blood (Routine X 2) w Reflex to ID Panel     Status: None (Preliminary result)   Collection Time: 03/22/20  3:01 AM   Specimen: BLOOD  Result Value Ref Range Status   Specimen Description BLOOD RIGHT ANTECUBITAL  Final   Special Requests   Final    BOTTLES DRAWN AEROBIC ONLY Blood Culture adequate volume   Culture   Final    NO GROWTH 1  DAY Performed at [facility], [place] [address] [place], [place] [address]    Report Status PENDING  Incomplete         Radiology Studies: CT Chest Wo Contrast  Result Date: 03/21/2020 CLINICAL DATA:  Shortness of breath and cough. EXAM: CT CHEST WITHOUT CONTRAST TECHNIQUE: Multidetector CT imaging of the chest was performed following the standard protocol without IV contrast. COMPARISON:  Chest x-ray, same date. FINDINGS: Cardiovascular: Significant cardiac enlargement involving all 4 chambers. No pericardial effusion. Some fluid noted in the pericardial recesses. The aorta is normal in caliber. Moderate tortuosity and atherosclerotic calcifications are noted. Aberrant right subclavian artery with moderate atherosclerotic calcifications. Fairly extensive three-vessel coronary artery calcifications. Mediastinum/Nodes: Scattered mediastinal and hilar lymph nodes but no mass or overt adenopathy. The esophagus is grossly normal. There is a very large hiatal hernia not significantly changed when compared to a prior abdominal CT scan 2020. The  entire stomach is up in the chest along with part of the transverse colon. Lungs/Pleura: The lungs are clear of an acute process. There is moderate streaky subsegmental atelectasis adjacent to the large hiatal hernia sac. No worrisome pulmonary lesions. No pulmonary edema or pleural effusions. Upper Abdomen: No significant upper abdominal findings. Musculoskeletal: Bilateral breast prostheses.  No breast masses. The bony thorax is intact. IMPRESSION: 1. Cardiac enlargement.  No pericardial effusion. 2. Aberrant right subclavian artery. 3. Very large hiatal hernia with the entire stomach up in the chest along with part of the transverse colon. 4. Areas of subsegmental atelectasis adjacent to the large hiatal hernia sac. 5. No acute pulmonary findings or worrisome pulmonary lesions. 6. Aortic atherosclerosis. Aortic Atherosclerosis (ICD10-I70.0). Electronically  Signed   By: Marijo Sanes M.D.   On: 03/21/2020 16:13   DG Chest Port 1 View  Result Date: 03/21/2020 CLINICAL DATA:  Shortness of breath EXAM: PORTABLE CHEST 1 VIEW COMPARISON:  None. FINDINGS: There is consolidation in the left lower lobe medially. The lungs elsewhere are clear. There is cardiomegaly with pulmonary vascularity normal. There is aortic atherosclerosis. There is a large paraesophageal type hernia. No adenopathy appreciable. There are surgical clips in the right axillary region. IMPRESSION: 1. Left lower lobe consolidation medially, likely due to pneumonia. A lesion obstructing the left lower lobe bronchus cannot be excluded on this study. 2.  Cardiomegaly. 3.  Large paraesophageal type hernia. 4.  Aortic Atherosclerosis (ICD10-I70.0). Followup PA and lateral chest radiographs recommended in 3-4 weeks following trial of antibiotic therapy to ensure resolution and exclude underlying malignancy. Electronically Signed   By: Lowella Grip III M.D.   On: 03/21/2020 12:51   ECHOCARDIOGRAM COMPLETE  Result Date: 03/22/2020    ECHOCARDIOGRAM REPORT   Patient Name:   SHALETHA HUMBLE Date of Exam: 03/22/2020 Medical Rec #:  [id number]      Height:       62.0 in Accession #:    [id number]     Weight:       146.2 lb Date of Birth:  [birth year]      BSA:          1.673 m Patient Age:    67 years       BP:           143/55 mmHg Patient Gender: F              HR:           79 bpm. Exam Location:  Inpatient Procedure: 2D Echo, Cardiac Doppler and Color Doppler Indications:    CHF-Acute Diastolic  History:        Patient has prior history of Echocardiogram examinations, most                 recent 09/22/2015. CHF, Arrythmias:Atrial Fibrillation; Risk                 Factors:Dyslipidemia and Hypertension. Tricuspid regurgitation.                 GERD. Bilateral mastectomies with bilateral breast implants.  Sonographer:    Clayton Lefort RDCS (AE) Referring Phys: [id number] Lequita Halt  Sonographer Comments:  Technically challenging study due to limited acoustic windows, Technically difficult study due to poor echo windows and no parasternal window. Image acquisition challenging due to breast implants and Image acquisition challenging due to mastectomy. Attempts at parasternal images later in study were taken from apical position. IMPRESSIONS  1. Left ventricular ejection  fraction, by estimation, is 55 to 60%. The left ventricle has normal function. The left ventricle has no regional wall motion abnormalities. Left ventricular diastolic function could not be evaluated. There is the interventricular septum is flattened in diastole ('D' shaped left ventricle), consistent with right ventricular volume overload.  2. Right ventricular systolic function is severely reduced. The right ventricular size is severely enlarged. There is mildly elevated pulmonary artery systolic pressure. The estimated right ventricular systolic pressure is 16.1 mmHg.  3. Left atrial size was severely dilated.  4. Right atrial size was giant.  5. The mitral valve is degenerative. Mild mitral valve regurgitation. Moderate mitral annular calcification.  6. Severely dilated tricuspid annulus with leaflet malcoaptation and torrential regurgitation. The tricuspid valve is abnormal. Tricuspid valve regurgitation wide-open.  7. The aortic valve is grossly normal. Aortic valve regurgitation is trivial. Mild to moderate aortic valve sclerosis/calcification is present, without any evidence of aortic stenosis.  8. The inferior vena cava is dilated in size with <50% respiratory variability, suggesting right atrial pressure of 15 mmHg. Comparison(s): Prior images unable to be directly viewed, comparison made by report only. The right ventricular hypertrophy is significantly worse. FINDINGS  Left Ventricle: Left ventricular ejection fraction, by estimation, is 55 to 60%. The left ventricle has normal function. The left ventricle has no regional wall motion  abnormalities. The left ventricular internal cavity size was normal in size. There is  no left ventricular hypertrophy. The interventricular septum is flattened in diastole ('D' shaped left ventricle), consistent with right ventricular volume overload. Left ventricular diastolic function could not be evaluated due to atrial fibrillation. Left ventricular diastolic function could not be evaluated. Right Ventricle: The right ventricular size is severely enlarged. No increase in right ventricular wall thickness. Right ventricular systolic function is severely reduced. There is mildly elevated pulmonary artery systolic pressure. The tricuspid regurgitant velocity is 2.13 m/s, and with an assumed right atrial pressure of 15 mmHg, the estimated right ventricular systolic pressure is 09.6 mmHg. Left Atrium: Left atrial size was severely dilated. Right Atrium: Right atrial size was giant. Pericardium: There is no evidence of pericardial effusion. Mitral Valve: The mitral valve is degenerative in appearance. There is moderate thickening of the mitral valve leaflet(s). Moderate mitral annular calcification. Mild mitral valve regurgitation, with centrally-directed jet. MV peak gradient, 7.5 mmHg. The mean mitral valve gradient is 2.0 mmHg. Tricuspid Valve: Severely dilated tricuspid annulus with leaflet malcoaptation and torrential regurgitation. The tricuspid valve is abnormal. Tricuspid valve regurgitation wide-open. Aortic Valve: The aortic valve is grossly normal. Aortic valve regurgitation is trivial. Mild to moderate aortic valve sclerosis/calcification is present, without any evidence of aortic stenosis. Aortic valve mean gradient measures 4.3 mmHg. Aortic valve  peak gradient measures 7.8 mmHg. Pulmonic Valve: The pulmonic valve was not well visualized. Pulmonic valve regurgitation is not visualized. Aorta: The aortic root is normal in size and structure. Venous: The inferior vena cava is dilated in size with less than  50% respiratory variability, suggesting right atrial pressure of 15 mmHg. IAS/Shunts: No atrial level shunt detected by color flow Doppler.  RIGHT VENTRICLE            IVC RV Basal diam:  5.50 cm    IVC diam: 3.40 cm RV Mid diam:    5.10 cm RV S prime:     7.00 cm/s TAPSE (M-mode): 1.6 cm LEFT ATRIUM              Index       RIGHT  ATRIUM           Index LA Vol (A2C):   86.7 ml  51.82 ml/m RA Area:     47.10 cm LA Vol (A4C):   95.9 ml  57.32 ml/m RA Volume:   209.00 ml 124.91 ml/m LA Biplane Vol: 101.0 ml 60.36 ml/m  AORTIC VALVE AV Vmax:           139.33 cm/s AV Vmean:          95.833 cm/s AV VTI:            0.300 m AV Peak Grad:      7.8 mmHg AV Mean Grad:      4.3 mmHg LVOT Vmax:         62.82 cm/s LVOT Vmean:        42.175 cm/s LVOT VTI:          0.141 m LVOT/AV VTI ratio: 0.47 MITRAL VALVE                TRICUSPID VALVE MV Area (PHT): 3.85 cm     TR Peak grad:   18.1 mmHg MV Peak grad:  7.5 mmHg     TR Vmax:        213.00 cm/s MV Mean grad:  2.0 mmHg MV Vmax:       1.37 m/s     SHUNTS MV Vmean:      55.0 cm/s    Systemic VTI: 0.14 m MV Decel Time: 197 msec MV E velocity: 123.00 cm/s Mihai Croitoru MD Electronically signed by Sanda Klein MD Signature Date/Time: 03/22/2020/2:23:18 PM    Final         Scheduled Meds:  allopurinol  100 mg Oral Daily   digoxin  0.0625 mg Oral Daily   fenofibrate  160 mg Oral Daily   mouth rinse  15 mL Mouth Rinse BID   pantoprazole  40 mg Oral BID AC   pravastatin  40 mg Oral Daily   sodium chloride flush  3 mL Intravenous Q12H   sucralfate  1 g Oral TID WC & HS   warfarin  2 mg Oral ONCE-1600   Warfarin - Pharmacist Dosing Inpatient   Does not apply q1600   Continuous Infusions:  sodium chloride      Assessment & Plan:   Principal Problem:   Acute respiratory failure with hypoxia (HCC) Active Problems:   Hyperlipidemia   Gout   Essential hypertension   ATRIAL FIBRILLATION   Barrett's esophagus   Acute CHF (congestive heart failure)  (HCC)   Pneumonitis   1. Acute hypoxemic respiratory failure, combination of cardiogenic pulmonary edema (decomepensated chronic diastolic heart failure) and pneumonitis due to aspiration.  Patient with large hiatal hernia and risk for aspiration and aspiration pneumonitis. Her volume status has improved, documented urine output over last 24 hrs is 800, and blood pressure has been low with systolic 90 to 295 mmHg.  Continue on supplemental 02, currently on 4 l/min per [place] with oxygen saturation at  99%.  3/16-we will start weaning down O2\ To hold Lasix and antihypertensive since BP stable Aspiration precaution-ppi Speech path evaluation-Patient had mild aspiration risk.  On regular, thin liquid diet Cardiology consult for echo findings.  Will repeat cxr  2. Paroxysmal atrial fibrillation.  On digoxin-level stable. Continue a/c with warfarin-inr therapeutic   3. HTN. bp meds held due to hypotension Currently normotensive. Will continue to monitor   4. AKI on CKD stage 3a/ hyperkalemia. Renal function with  worsening cr up to 2,16 with K at 5,2 and serum bicarbonate at 26. 3/16-improving slowly Continue to hold lasix Avoid hypotension and nephrotoxic meds    5. Dyslipidemia.  Continue pravastatin and fenofibrate    6. Gout.  No acute flares Continue allopurinol   7. Anxiety.  As needed alprazolam   PT-recommend home health  DVT prophylaxis: coumadin Code Status:DNR Family Communication: daughter at bedside  Status is: Inpatient  Remains inpatient appropriate because:Inpatient level of care appropriate due to severity of illness   Dispo: The patient is from: Home              Anticipated d/c is to: Home              Patient currently is not medically stable to d/c.   Difficult to place patient No anticipitated d/c 1-2 days. Need to wean off 02. Cards consulted pending           LOS: 2 days   Time spent: 35 min with >50% on coc    Nolberto Hanlon,  MD [facility] Pager [phone]  If 7PM-7AM, please contact night-coverage 03/23/2020, 10:37 AM

## 2020-03-23 NOTE — Consult Note (Signed)
Cardiology Consultation:   Patient ID: Deanna [name] MRN: [id number]; DOB: 09/09/1926  Admit date: 03/21/2020 Date of Consult: 03/23/2020  PCP:  Deanna [name], [place]  Cardiologist:  Deanna Grooms, MD    Patient Profile:   Deanna [name] is a 87 y.o. female with a hx of chronic atrial fibrillation on digoxin and Coumadin which is managed by PCP, breast cancer s/p mastectomy, hypertension and hyperlipidemia who is being seen today for the evaluation of CHF at the request of Dr. Kurtis [name].  Seen by Deanna [name] September 2017 when admitted for chest pain.  Troponin was low flat.  Low risk nuclear stress test.  Echocardiogram with LV function of 55 to 60%   History of Present Illness:   [name] presented with 1 week history of shortness of breath, orthopnea and cough.  Poorly controlled blood pressure for past few months.  Covid negative.  CT of chest without pericardial effusion.  Large hiatal hernia.  Patient was admitted for acute hypoxic respiratory failure secondary to pulmonary edema and pneumonitis due to aspiration.  Patient uses electric bed at home.  Occasionally wakes up at night with coughing spell.  Intermittent chest pain lasting for few minutes.  BNP minimally elevated 150>>192 Hs-troponin 41>>45 Creatinine 1.93>>2.16>>2.11 Patient got IV Lasix x2 now discontinued due to worsening renal function   Echo 03/23/2018  1. Left ventricular ejection fraction, by estimation, is 55 to 60%. The  left ventricle has normal function. The left ventricle has no regional  wall motion abnormalities. Left ventricular diastolic function could not  be evaluated. There is the  interventricular septum is flattened in diastole ('D' shaped left  ventricle), consistent with right ventricular volume overload.  2. Right ventricular systolic function is severely reduced. The right  ventricular size is severely enlarged. There is mildly elevated pulmonary   artery systolic pressure. The estimated right ventricular systolic  pressure is 13.0 mmHg.  3. Left atrial size was severely dilated.  4. Right atrial size was giant.  5. The mitral valve is degenerative. Mild mitral valve regurgitation.  Moderate mitral annular calcification.  6. Severely dilated tricuspid annulus with leaflet malcoaptation and  torrential regurgitation. The tricuspid valve is abnormal. Tricuspid valve  regurgitation wide-open.  7. The aortic valve is grossly normal. Aortic valve regurgitation is  trivial. Mild to moderate aortic valve sclerosis/calcification is present,  without any evidence of aortic stenosis.  8. The inferior vena cava is dilated in size with <50% respiratory  variability, suggesting right atrial pressure of 15 mmHg.   Past Medical History:  Diagnosis Date  . Anxiety   . Arthritis   . Atrial fibrillation ([place])   . Barrett's esophagus   . Breast cancer ([place])   . Colon polyp 2009   TUBULAR ADENOMA  . Diverticulosis of colon (without mention of hemorrhage) 2009  . GERD (gastroesophageal reflux disease)   . Hyperlipidemia   . Hypertension   . Status post dilation of esophageal narrowing   . TRICUSPID REGURGITATION 10/23/2006    Past Surgical History:  Procedure Laterality Date  . ABDOMINAL HYSTERECTOMY    . bcc-face    . CATARACT EXTRACTION Bilateral   . MASTECTOMY Bilateral      Inpatient Medications: Scheduled Meds: . allopurinol  100 mg Oral Daily  . fenofibrate  160 mg Oral Daily  . mouth rinse  15 mL Mouth Rinse BID  . multivitamin with minerals  1 tablet Oral Daily  . pantoprazole  40 mg Oral BID AC  . pravastatin  40 mg Oral Daily  . sodium chloride flush  3 mL Intravenous Q12H  . sucralfate  1 g Oral TID WC & HS  . Warfarin - Pharmacist Dosing Inpatient   Does not apply q1600   Continuous Infusions: . sodium chloride     PRN Meds: sodium chloride, acetaminophen, ALPRAZolam, ondansetron (ZOFRAN) IV, sodium  chloride flush  Allergies:    Allergies  Allergen Reactions  . Aspirin Other (See Comments)    REACTION: nervousness---tolerates ibuprofen  . Atorvastatin Other (See Comments)    myalgia    Social History:   Social History   Socioeconomic History  . Marital status: Married    Spouse name: Not on file  . Number of children: 3  . Years of education: Not on file  . Highest education level: Not on file  Occupational History  . Occupation: retired    Fish farm manager: RETIRED  Tobacco Use  . Smoking status: Never Smoker  . Smokeless tobacco: Never Used  Substance and Sexual Activity  . Alcohol use: Yes    Alcohol/week: 5.0 standard drinks    Types: 5 drink(s) per week  . Drug use: No  . Sexual activity: Not on file  Other Topics Concern  . Not on file  Social History Narrative  . Not on file   Social Determinants of Health   Financial Resource Strain: Not on file  Food Insecurity: Not on file  Transportation Needs: Not on file  Physical Activity: Not on file  Stress: Not on file  Social Connections: Not on file  Intimate Partner Violence: Not on file    Family History:   Family History  Problem Relation Age of Onset  . Colon cancer Mother   . Hypertension Mother   . Colon cancer Father   . Breast cancer Other   . Colon polyps Son      ROS:  Please see the history of present illness.  All other ROS reviewed and negative.     Physical Exam/Data:   Vitals:   03/23/20 1121 03/23/20 1152 03/23/20 1156 03/23/20 1306  BP:  (!) 110/42 (!) 110/41   Pulse:  (!) 50 (!) 51   Resp:  18 18   Temp:  (!) 97.4 F (36.3 C)    TempSrc:  Oral    SpO2: 97% 95% 96% 94%  Weight:      Height:        Intake/Output Summary (Last 24 hours) at 03/23/2020 1848 Last data filed at 03/23/2020 1241 Gross per 24 hour  Intake 222 ml  Output 700 ml  Net -478 ml   Last 3 Weights 03/23/2020 03/22/2020 03/22/2020  Weight (lbs) 145 lb 1.6 oz 146 lb 2.6 oz 146 lb 2.6 oz  Weight (kg) 65.817  kg 66.3 kg 66.3 kg     Body mass index is 26.54 kg/m.  General: Elderly frail female in no acute distress HEENT: normal Lymph: no adenopathy Neck: no JVD Endocrine:  No thryomegaly Vascular: No carotid bruits; FA pulses 2+ bilaterally without bruits  Cardiac:  normal S1, S2; RRR; no murmur  Lungs:  Diminished breath sound with rales  Abd: soft, nontender, no hepatomegaly  Ext: no edema Musculoskeletal:  No deformities, BUE and BLE strength normal and equal Skin: warm and dry  Neuro:  CNs 2-12 intact, no focal abnormalities noted Psych:  Normal affect   EKG:  The EKG was personally reviewed and demonstrates: Junctional rhythm at rate of  52 bpm Telemetry:  Telemetry was personally reviewed and demonstrates:  Afib,   Relevant CV Studies Stress test 09/2015   No T wave inversion was noted during stress.  There was no ST segment deviation noted during stress.  The study is normal.  This is a low risk study.  The left ventricular ejection fraction is hyperdynamic (>65%).   Normal resting and stress perfusion. No ischemia or infarction EF 79%   Laboratory Data:  High Sensitivity Troponin:   Recent Labs  Lab 03/21/20 1252 03/21/20 1427  TROPONINIHS 41* 45*     Chemistry Recent Labs  Lab 03/21/20 1252 03/22/20 0301 03/23/20 0404  NA 137 137 135  K 5.1 5.2* 4.8  CL 105 104 99  CO2 23 26 27   GLUCOSE 172* 138* 106*  BUN 64* 69* 68*  CREATININE 1.93* 2.16* 2.11*  CALCIUM 9.6 9.3 9.3  GFRNONAA 24* 21* 22*  ANIONGAP 9 7 9     Recent Labs  Lab 03/21/20 1252  PROT 6.8  ALBUMIN 3.4*  AST 32  ALT 19  ALKPHOS 60  BILITOT 1.7*   Hematology Recent Labs  Lab 03/21/20 1252 03/22/20 0301 03/23/20 0404  WBC 6.6 5.0 5.3  RBC 4.32 3.96 4.10  HGB 12.4 11.8* 12.1  HCT 39.7 36.2 37.2  MCV 91.9 91.4 90.7  MCH 28.7 29.8 29.5  MCHC 31.2 32.6 32.5  RDW 17.8* 17.8* 17.7*  PLT 223 199 203   BNP Recent Labs  Lab 03/21/20 1254 03/23/20 0404  BNP 150.1* 192.7*     DDimer No results for input(s): DDIMER in the last 168 hours.   Radiology/Studies:  CT Chest Wo Contrast  Result Date: 03/21/2020 CLINICAL DATA:  Shortness of breath and cough. EXAM: CT CHEST WITHOUT CONTRAST TECHNIQUE: Multidetector CT imaging of the chest was performed following the standard protocol without IV contrast. COMPARISON:  Chest x-ray, same date. FINDINGS: Cardiovascular: Significant cardiac enlargement involving all 4 chambers. No pericardial effusion. Some fluid noted in the pericardial recesses. The aorta is normal in caliber. Moderate tortuosity and atherosclerotic calcifications are noted. Aberrant right subclavian artery with moderate atherosclerotic calcifications. Fairly extensive three-vessel coronary artery calcifications. Mediastinum/Nodes: Scattered mediastinal and hilar lymph nodes but no mass or overt adenopathy. The esophagus is grossly normal. There is a very large hiatal hernia not significantly changed when compared to a prior abdominal CT scan 2020. The entire stomach is up in the chest along with part of the transverse colon. Lungs/Pleura: The lungs are clear of an acute process. There is moderate streaky subsegmental atelectasis adjacent to the large hiatal hernia sac. No worrisome pulmonary lesions. No pulmonary edema or pleural effusions. Upper Abdomen: No significant upper abdominal findings. Musculoskeletal: Bilateral breast prostheses.  No breast masses. The bony thorax is intact. IMPRESSION: 1. Cardiac enlargement.  No pericardial effusion. 2. Aberrant right subclavian artery. 3. Very large hiatal hernia with the entire stomach up in the chest along with part of the transverse colon. 4. Areas of subsegmental atelectasis adjacent to the large hiatal hernia sac. 5. No acute pulmonary findings or worrisome pulmonary lesions. 6. Aortic atherosclerosis. Aortic Atherosclerosis (ICD10-I70.0). Electronically Signed   By: Marijo Sanes M.D.   On: 03/21/2020 16:13   DG  Chest Port 1 View  Result Date: 03/21/2020 CLINICAL DATA:  Shortness of breath EXAM: PORTABLE CHEST 1 VIEW COMPARISON:  None. FINDINGS: There is consolidation in the left lower lobe medially. The lungs elsewhere are clear. There is cardiomegaly with pulmonary vascularity normal. There is aortic atherosclerosis. There  is a large paraesophageal type hernia. No adenopathy appreciable. There are surgical clips in the right axillary region. IMPRESSION: 1. Left lower lobe consolidation medially, likely due to pneumonia. A lesion obstructing the left lower lobe bronchus cannot be excluded on this study. 2.  Cardiomegaly. 3.  Large paraesophageal type hernia. 4.  Aortic Atherosclerosis (ICD10-I70.0). Followup PA and lateral chest radiographs recommended in 3-4 weeks following trial of antibiotic therapy to ensure resolution and exclude underlying malignancy. Electronically Signed   By: Lowella Grip III M.D.   On: 03/21/2020 12:51   DG Swallowing Func-Speech Pathology  Result Date: 03/23/2020 Objective Swallowing Evaluation: Type of Study: MBS-Modified Barium Swallow Study  Patient Details Name: Deanna [name] MRN: [id number] Date of Birth: 28-Nov-1926 Today's Date: 03/23/2020 Time: SLP Start Time (ACUTE ONLY): 1428 -SLP Stop Time (ACUTE ONLY): 1444 SLP Time Calculation (min) (ACUTE ONLY): 16 min Past Medical History: Past Medical History: Diagnosis Date . Anxiety  . Arthritis  . Atrial fibrillation ([place])  . Barrett's esophagus  . Breast cancer ([place])  . Colon polyp 2009  TUBULAR ADENOMA . Diverticulosis of colon (without mention of hemorrhage) 2009 . GERD (gastroesophageal reflux disease)  . Hyperlipidemia  . Hypertension  . Status post dilation of esophageal narrowing  . TRICUSPID REGURGITATION 10/23/2006 Past Surgical History: Past Surgical History: Procedure Laterality Date . ABDOMINAL HYSTERECTOMY   . bcc-face   . CATARACT EXTRACTION Bilateral  . MASTECTOMY Bilateral  HPI: Patient is a 87 y/o female with a working  diagnosis of acute diastolic heart failure exacerbation, complicated with acute pneumonitis and acute hypoxemic respiratory failure. CXR 3/14: Left lower lobe consolidation medially, likely due to pneumonia. CT Chest 3/14: large hiatal hernia with the entire stomach up in the chest along with part of the transverse colon; No acute pulmonary findings or worrisome pulmonary lesions. PMH includes GERD, Barrett's esophagus, HTN, breast ca, A-fib, tricuspid regurgitation.  Subjective: Pt was pleasant and agreeable Assessment / Plan / Recommendation CHL IP CLINICAL IMPRESSIONS 03/23/2020 Clinical Impression Pt presents with an oropharyngeal swallow that is [facility] in which she demonstrated appropriate airway protection of all consistencies. Her oral phase was characterized by multiple pumps to propel the boluses back with solid consistencies; but was overall functional and timely with no residue noted. Instances of penetration (PAS 2) were noted with larger consecutive sips of thin liquids; however, the pentrates cleared during her swallow. Her swallow initation for thin liquids occured at the pyiform sinuses but it was still timely and efficient. Provided education and a handout on esophageal precautions. MD may want to consider consultation with GI if her esophageal symptoms continue to persist (belching was noted during her assessment this morning as well as after her MBS). Given these results, recommend a regular diet and thin liquids - SLP will sign off at this time.  SLP Visit Diagnosis Dysphagia, unspecified (R13.10) Attention and concentration deficit following -- Frontal lobe and executive function deficit following -- Impact on safety and function Mild aspiration risk   CHL IP TREATMENT RECOMMENDATION 03/23/2020 Treatment Recommendations No treatment recommended at this time   Prognosis 03/23/2020 Prognosis for Safe Diet Advancement Good Barriers to Reach Goals -- Barriers/Prognosis Comment -- CHL IP DIET  RECOMMENDATION 03/23/2020 SLP Diet Recommendations Regular solids;Thin liquid Liquid Administration via Cup;Straw Medication Administration Whole meds with liquid Compensations Slow rate;Small sips/bites;Follow solids with liquid Postural Changes Remain semi-upright after after feeds/meals (Comment);Seated upright at 90 degrees   CHL IP OTHER RECOMMENDATIONS 03/23/2020 Recommended Consults Consider GI evaluation Oral Care Recommendations  Oral care BID Other Recommendations --   CHL IP FOLLOW UP RECOMMENDATIONS 03/23/2020 Follow up Recommendations None   No flowsheet data found.     CHL IP ORAL PHASE 03/23/2020 Oral Phase WFL Oral - Pudding Teaspoon -- Oral - Pudding Cup -- Oral - Honey Teaspoon -- Oral - Honey Cup -- Oral - Nectar Teaspoon -- Oral - Nectar Cup -- Oral - Nectar Straw -- Oral - Thin Teaspoon -- Oral - Thin Cup -- Oral - Thin Straw -- Oral - Puree -- Oral - Mech Soft -- Oral - Regular -- Oral - Multi-Consistency -- Oral - Pill -- Oral Phase - Comment --  CHL IP PHARYNGEAL PHASE 03/23/2020 Pharyngeal Phase Impaired Pharyngeal- Pudding Teaspoon -- Pharyngeal -- Pharyngeal- Pudding Cup -- Pharyngeal -- Pharyngeal- Honey Teaspoon -- Pharyngeal -- Pharyngeal- Honey Cup -- Pharyngeal -- Pharyngeal- Nectar Teaspoon -- Pharyngeal -- Pharyngeal- Nectar Cup -- Pharyngeal -- Pharyngeal- Nectar Straw -- Pharyngeal -- Pharyngeal- Thin Teaspoon -- Pharyngeal -- Pharyngeal- Thin Cup WFL Pharyngeal -- Pharyngeal- Thin Straw Penetration/Aspiration before swallow Pharyngeal Material enters airway, remains ABOVE vocal cords then ejected out Pharyngeal- Puree WFL Pharyngeal -- Pharyngeal- Mechanical Soft WFL Pharyngeal -- Pharyngeal- Regular -- Pharyngeal -- Pharyngeal- Multi-consistency -- Pharyngeal -- Pharyngeal- Pill WFL Pharyngeal -- Pharyngeal Comment --  CHL IP CERVICAL ESOPHAGEAL PHASE 03/23/2020 Cervical Esophageal Phase WFL Pudding Teaspoon -- Pudding Cup -- Honey Teaspoon -- Honey Cup -- Nectar Teaspoon -- Nectar Cup  -- Nectar Straw -- Thin Teaspoon -- Thin Cup -- Thin Straw -- Puree -- Mechanical Soft -- Regular -- Multi-consistency -- Pill -- Cervical Esophageal Comment -- Note populated for Lebron Conners, Student SLP Osie Bond., M.A. [place] Pager [phone] Office [phone] 03/23/2020, 4:00 PM              ECHOCARDIOGRAM COMPLETE  Result Date: 03/22/2020    ECHOCARDIOGRAM REPORT   Patient Name:   Deanna [name] Date of Exam: 03/22/2020 Medical Rec #:  [id number]      Height:       62.0 in Accession #:    [id number]     Weight:       146.2 lb Date of Birth:  November 20, 1926      BSA:          1.673 m Patient Age:    14 years       BP:           143/55 mmHg Patient Gender: F              HR:           79 bpm. Exam Location:  Inpatient Procedure: 2D Echo, Cardiac Doppler and Color Doppler Indications:    CHF-Acute Diastolic  History:        Patient has prior history of Echocardiogram examinations, most                 recent 09/22/2015. CHF, Arrythmias:Atrial Fibrillation; Risk                 Factors:Dyslipidemia and Hypertension. Tricuspid regurgitation.                 GERD. Bilateral mastectomies with bilateral breast implants.  Sonographer:    Clayton Lefort RDCS (AE) Referring Phys: [id number] Lequita Halt  Sonographer Comments: Technically challenging study due to limited acoustic windows, Technically difficult study due to poor echo windows and no parasternal window. Image acquisition challenging due to breast implants and Image acquisition  challenging due to mastectomy. Attempts at parasternal images later in study were taken from apical position. IMPRESSIONS  1. Left ventricular ejection fraction, by estimation, is 55 to 60%. The left ventricle has normal function. The left ventricle has no regional wall motion abnormalities. Left ventricular diastolic function could not be evaluated. There is the interventricular septum is flattened in diastole ('D' shaped left ventricle), consistent with right  ventricular volume overload.  2. Right ventricular systolic function is severely reduced. The right ventricular size is severely enlarged. There is mildly elevated pulmonary artery systolic pressure. The estimated right ventricular systolic pressure is 76.1 mmHg.  3. Left atrial size was severely dilated.  4. Right atrial size was giant.  5. The mitral valve is degenerative. Mild mitral valve regurgitation. Moderate mitral annular calcification.  6. Severely dilated tricuspid annulus with leaflet malcoaptation and torrential regurgitation. The tricuspid valve is abnormal. Tricuspid valve regurgitation wide-open.  7. The aortic valve is grossly normal. Aortic valve regurgitation is trivial. Mild to moderate aortic valve sclerosis/calcification is present, without any evidence of aortic stenosis.  8. The inferior vena cava is dilated in size with <50% respiratory variability, suggesting right atrial pressure of 15 mmHg. Comparison(s): Prior images unable to be directly viewed, comparison made by report only. The right ventricular hypertrophy is significantly worse. FINDINGS  Left Ventricle: Left ventricular ejection fraction, by estimation, is 55 to 60%. The left ventricle has normal function. The left ventricle has no regional wall motion abnormalities. The left ventricular internal cavity size was normal in size. There is  no left ventricular hypertrophy. The interventricular septum is flattened in diastole ('D' shaped left ventricle), consistent with right ventricular volume overload. Left ventricular diastolic function could not be evaluated due to atrial fibrillation. Left ventricular diastolic function could not be evaluated. Right Ventricle: The right ventricular size is severely enlarged. No increase in right ventricular wall thickness. Right ventricular systolic function is severely reduced. There is mildly elevated pulmonary artery systolic pressure. The tricuspid regurgitant velocity is 2.13 m/s, and with  an assumed right atrial pressure of 15 mmHg, the estimated right ventricular systolic pressure is 60.7 mmHg. Left Atrium: Left atrial size was severely dilated. Right Atrium: Right atrial size was giant. Pericardium: There is no evidence of pericardial effusion. Mitral Valve: The mitral valve is degenerative in appearance. There is moderate thickening of the mitral valve leaflet(s). Moderate mitral annular calcification. Mild mitral valve regurgitation, with centrally-directed jet. MV peak gradient, 7.5 mmHg. The mean mitral valve gradient is 2.0 mmHg. Tricuspid Valve: Severely dilated tricuspid annulus with leaflet malcoaptation and torrential regurgitation. The tricuspid valve is abnormal. Tricuspid valve regurgitation wide-open. Aortic Valve: The aortic valve is grossly normal. Aortic valve regurgitation is trivial. Mild to moderate aortic valve sclerosis/calcification is present, without any evidence of aortic stenosis. Aortic valve mean gradient measures 4.3 mmHg. Aortic valve  peak gradient measures 7.8 mmHg. Pulmonic Valve: The pulmonic valve was not well visualized. Pulmonic valve regurgitation is not visualized. Aorta: The aortic root is normal in size and structure. Venous: The inferior vena cava is dilated in size with less than 50% respiratory variability, suggesting right atrial pressure of 15 mmHg. IAS/Shunts: No atrial level shunt detected by color flow Doppler.  RIGHT VENTRICLE            IVC RV Basal diam:  5.50 cm    IVC diam: 3.40 cm RV Mid diam:    5.10 cm RV S prime:     7.00 cm/s TAPSE (M-mode): 1.6 cm LEFT  ATRIUM              Index       RIGHT ATRIUM           Index LA Vol (A2C):   86.7 ml  51.82 ml/m RA Area:     47.10 cm LA Vol (A4C):   95.9 ml  57.32 ml/m RA Volume:   209.00 ml 124.91 ml/m LA Biplane Vol: 101.0 ml 60.36 ml/m  AORTIC VALVE AV Vmax:           139.33 cm/s AV Vmean:          95.833 cm/s AV VTI:            0.300 m AV Peak Grad:      7.8 mmHg AV Mean Grad:      4.3 mmHg LVOT  Vmax:         62.82 cm/s LVOT Vmean:        42.175 cm/s LVOT VTI:          0.141 m LVOT/AV VTI ratio: 0.47 MITRAL VALVE                TRICUSPID VALVE MV Area (PHT): 3.85 cm     TR Peak grad:   18.1 mmHg MV Peak grad:  7.5 mmHg     TR Vmax:        213.00 cm/s MV Mean grad:  2.0 mmHg MV Vmax:       1.37 m/s     SHUNTS MV Vmean:      55.0 cm/s    Systemic VTI: 0.14 m MV Decel Time: 197 msec MV E velocity: 123.00 cm/s Dani Gobble Croitoru MD Electronically signed by Sanda Klein MD Signature Date/Time: 03/22/2020/2:23:18 PM    Final      Assessment and Plan:   1. Permanent atrial fibrillation -Review of telemetry and EKG seems patient has chronic A. fib with intermittent junctional bradycardia. -Will stop digoxin given age, bradycardia and renal function -Continue Coumadin per pharmacy -Denies bleeding issue -Echocardiogram with LV function of 55 to 60%, no wall motion abnormality, findings consistent with right ventricular volume overload.  Mild mitral regurgitation,  2.  Dyspnea 3.  Pneumonitis/aspiration pneumonia -Her renal function get worse after 2 dose of Lasix  -BNP minimally elevated likely due to pneumonia -Avoid diuretics -Symptoms likely due to pneumonia/aspiration  4. Tricuspid valve abnormality - Severely dilated tricuspid annulus with leaflet malcoaptation and  torrential regurgitation. The tricuspid valve is abnormal. Tricuspid valve  regurgitation wide-open. - Managed conservatively   5. Acute on CKD stage III - Baseline Scr was 1.1 in 2020 - Scr of 1.93>>2.16>>2.11 here  6. Elevated troponin  - Not ACS pattern - 41>>45 - Likely demand in setting of acute illness and elevated creatinine   Risk Assessment/Risk Scores:    CHA2DS2-VASc Score = 4  This indicates a 4.8% annual risk of stroke. The patient's score is based upon: CHF History: No HTN History: Yes Diabetes History: No Stroke History: No Vascular Disease History: No Age Score: 2 Gender Score: 1   For  questions or updates, please contact [place] Please consult [email or web address] for contact info under    Jarrett Soho, [place]  03/23/2020 6:48 PM

## 2020-03-23 NOTE — Progress Notes (Signed)
Modified Barium Swallow Progress Note  Patient Details  Name: Deanna [name] MRN: [id number] Date of Birth: 1926/02/13  Today's Date: 03/23/2020  Modified Barium Swallow completed.  Full report located under Chart Review in the Imaging Section.  Brief recommendations include the following:  Clinical Impression  Pt presents with an oropharyngeal swallow that is [facility] in which she demonstrated appropriate airway protection of all consistencies. Her oral phase was characterized by multiple pumps to propel the boluses back with solid consistencies; but was overall functional and timely with no residue noted. Instances of penetration (PAS 2) were noted with larger consecutive sips of thin liquids; however, the pentrates cleared during her swallow. Her swallow initation for thin liquids occured at the pyiform sinuses but it was still timely and efficient. Provided education and a handout on esophageal precautions. MD may want to consider consultation with GI if her esophageal symptoms continue to persist (belching was noted during her assessment this morning as well as after her MBS). Given these results, recommend a regular diet and thin liquids - SLP will sign off at this time.   Swallow Evaluation Recommendations   Recommended Consults: Consider GI evaluation   SLP Diet Recommendations: Regular solids;Thin liquid   Liquid Administration via: Cup;Straw   Medication Administration: Whole meds with liquid   Supervision: Patient able to self feed;Intermittent supervision to cue for compensatory strategies   Compensations: Slow rate;Small sips/bites;Follow solids with liquid   Postural Changes: Remain semi-upright after after feeds/meals (Comment);Seated upright at 90 degrees   Oral Care Recommendations: Oral care BID        Jeanine Luz., SLP Student 03/23/2020,3:52 PM

## 2020-03-23 NOTE — Progress Notes (Signed)
Initial Nutrition Assessment  DOCUMENTATION CODES:  Not applicable  INTERVENTION:  Recommend liberalizing diet to low sodium to promote PO intake and increase choices - spoke with MD.  Add Magic cup TID with meals, each supplement provides 290 kcal and 9 grams of protein.  Add MVI with minerals daily.  NUTRITION DIAGNOSIS:  Increased nutrient needs related to acute illness (AKI, acute CHF exacerbation, and acute pneumonitis) as evidenced by estimated needs.  GOAL:  Patient will meet greater than or equal to 90% of their needs  MONITOR:  PO intake,Supplement acceptance,Labs,Weight trends,Diet advancement  REASON FOR ASSESSMENT:  Consult Assessment of nutrition requirement/status  ASSESSMENT:  86 yo female with a PMH of GERD, HLD, HTN, A-fib, breast cancer, diverticulosis of colon, Barrett's esophagus, CKD stage 3a, and arthritis who presents with AKI, acute CHF exacerbation, and acute pneumonitis. Noted to be hyperkalemic.  Pt sleepy and nurse taking vitals for most of visit. Spoke with daughter for most of the visit. She reports her mother lives in a nursing home, and she eats well there. They plan her meals, and that she has salads with lunch and dinner which can range from country fried steak to baked chicken. She had been preoccupied with talking with her friends instead of eating, but per daughter, this has changed and she eats more now at meals.  Her daughter has not noticed any weight loss but some gain. Pt, once woken up, endorses no weight loss. Of note, pt has moderate edema, indicating some fluid accumulation.  Explained the importance of watching potassium regularly but not to worry about it too much while in the hospital. Also highlighted the importance of not supplementing Vitamin K due to Warfarin use.  Pt interested in having Magic Cups with meals. Encouraged intake PO and of supplements. Also recommend a multivitamin.  Per daughter, SLP came by and will be doing an  x-ray at 2:30 PM. SLP reported mild aspiration risk - recommends regular texture diet with thin liquids.  Relevant Medications: Protonix, sucralfate, warfarin Labs: reviewed; Glucose 106 HbA1c: 7.5% (03/2020)  NUTRITION - FOCUSED PHYSICAL EXAM: Flowsheet Row Most Recent Value  Orbital Region No depletion  Upper Arm Region Mild depletion  Thoracic and Lumbar Region No depletion  Buccal Region Mild depletion  Temple Region Mild depletion  Clavicle Bone Region No depletion  Clavicle and Acromion Bone Region No depletion  Scapular Bone Region No depletion  Dorsal Hand Mild depletion  Patellar Region No depletion  Anterior Thigh Region No depletion  Posterior Calf Region No depletion  Edema (RD Assessment) Moderate  Hair Reviewed  Eyes Reviewed  Mouth Reviewed  Skin Reviewed  [dry with scabs on BLE closer to feet]  Nails Reviewed     Diet Order:   Diet Order            Diet renal with fluid restriction Fluid restriction: 1800 mL Fluid; Room service appropriate? Yes; Fluid consistency: Thin  Diet effective now                EDUCATION NEEDS:  Education needs have been addressed  Skin:  Skin Assessment: Reviewed RN Assessment  Last BM:  03/22/20  Height:  Ht Readings from Last 1 Encounters:  03/21/20 5\' 2"  (1.575 m)   Weight:  Wt Readings from Last 1 Encounters:  03/23/20 65.8 kg   Ideal Body Weight:  50 kg  BMI:  Body mass index is 26.54 kg/m.  Estimated Nutritional Needs:  Kcal:  1750-1950 Protein:  85-100 grams Fluid:  >  1.7 L  Derrel Nip, RD, LDN Registered Dietitian After Hours/Weekend Pager # in [place]

## 2020-03-23 NOTE — Evaluation (Signed)
Clinical/Bedside Swallow Evaluation Patient Details  Name: Deanna [name] MRN: [id number] Date of Birth: 13-Jul-1926  Today's Date: 03/23/2020 Time: SLP Start Time (ACUTE ONLY): 0914 SLP Stop Time (ACUTE ONLY): 0931 SLP Time Calculation (min) (ACUTE ONLY): 17 min  Past Medical History:  Past Medical History:  Diagnosis Date  . Anxiety   . Arthritis   . Atrial fibrillation ([place])   . Barrett's esophagus   . Breast cancer ([place])   . Colon polyp 2009   TUBULAR ADENOMA  . Diverticulosis of colon (without mention of hemorrhage) 2009  . GERD (gastroesophageal reflux disease)   . Hyperlipidemia   . Hypertension   . Status post dilation of esophageal narrowing   . TRICUSPID REGURGITATION 10/23/2006   Past Surgical History:  Past Surgical History:  Procedure Laterality Date  . ABDOMINAL HYSTERECTOMY    . bcc-face    . CATARACT EXTRACTION Bilateral   . MASTECTOMY Bilateral    HPI:  Patient is a 87 y/o female with a working diagnosis of acute diastolic heart failure exacerbation, complicated with acute pneumonitis and acute hypoxemic respiratory failure. CXR 3/14: Left lower lobe consolidation medially, likely due to pneumonia. CT Chest 3/14: large hiatal hernia with the entire stomach up in the chest along with part of the transverse colon; No acute pulmonary findings or worrisome pulmonary lesions. PMH includes GERD, Barrett's esophagus, HTN, breast ca, A-fib, tricuspid regurgitation.   Assessment / Plan / Recommendation Clinical Impression  No overt s/s of aspiration were noted with solids; however, a wet vocal quality and SOB was noted when pt was challenged with consecutive sips of larger volumes of thin liquids. Pt and daughter report compensatory strategies such as eating at a slower pace due to potential subjective symptoms of dysphagia. She also has an esophageal history, so SLP provided education such as remaining upright for 30 minutes after PO consumption. Given concern for  potential aspiration PNA, recommend an MBS to fully assess her oropharyngeal swallow function. In the meantime, she can continue her current diet of regular solids and thin liquids.  SLP Visit Diagnosis: Dysphagia, unspecified (R13.10)    Aspiration Risk  Mild aspiration risk    Diet Recommendation Regular;Thin liquid   Liquid Administration via: Cup;Straw Medication Administration: Whole meds with liquid Supervision: Patient able to self feed;Intermittent supervision to cue for compensatory strategies Compensations: Slow rate;Small sips/bites;Follow solids with liquid Postural Changes: Seated upright at 90 degrees;Remain upright for at least 30 minutes after po intake    Other  Recommendations Oral Care Recommendations: Oral care BID   Follow up Recommendations        Frequency and Duration            Prognosis Prognosis for Safe Diet Advancement: Good      Swallow Study   General HPI: Patient is a 87 y/o female with a working diagnosis of acute diastolic heart failure exacerbation, complicated with acute pneumonitis and acute hypoxemic respiratory failure. CXR 3/14: Left lower lobe consolidation medially, likely due to pneumonia. CT Chest 3/14: large hiatal hernia with the entire stomach up in the chest along with part of the transverse colon; No acute pulmonary findings or worrisome pulmonary lesions. PMH includes GERD, Barrett's esophagus, HTN, breast ca, A-fib, tricuspid regurgitation. Type of Study: Bedside Swallow Evaluation Previous Swallow Assessment: None in chart Diet Prior to this Study: Regular;Thin liquids Temperature Spikes Noted: No Respiratory Status: Nasal cannula History of Recent Intubation: No Behavior/Cognition: Alert;Cooperative;Pleasant mood Oral Cavity Assessment: Within Functional Limits Oral Care Completed by  SLP: No Oral Cavity - Dentition: Adequate natural dentition Vision: Functional for self-feeding Self-Feeding Abilities: Able to feed  self Patient Positioning: Upright in chair Baseline Vocal Quality: Normal Volitional Cough: Strong Volitional Swallow: Able to elicit    Oral/Motor/Sensory Function Overall Oral Motor/Sensory Function: Within functional limits   Ice Chips Ice chips: Within functional limits Presentation: Spoon   Thin Liquid Thin Liquid: Impaired Presentation: Self Fed;Cup;Straw Pharyngeal  Phase Impairments: Wet Vocal Quality    Nectar Thick Nectar Thick Liquid: Not tested   Honey Thick Honey Thick Liquid: Not tested   Puree Puree: Within functional limits Presentation: Self Fed;Spoon   Solid     Solid: Within functional limits Presentation: [place]., SLP Student 03/23/2020,11:13 AM

## 2020-03-24 DIAGNOSIS — Z515 Encounter for palliative care: Secondary | ICD-10-CM

## 2020-03-24 DIAGNOSIS — Z7189 Other specified counseling: Secondary | ICD-10-CM

## 2020-03-24 LAB — BASIC METABOLIC PANEL
Anion gap: 8 (ref 5–15)
BUN: 71 mg/dL — ABNORMAL HIGH (ref 8–23)
CO2: 25 mmol/L (ref 22–32)
Calcium: 9.1 mg/dL (ref 8.9–10.3)
Chloride: 99 mmol/L (ref 98–111)
Creatinine, Ser: 2.34 mg/dL — ABNORMAL HIGH (ref 0.44–1.00)
GFR, Estimated: 19 mL/min — ABNORMAL LOW (ref >60)
Glucose, Bld: 118 mg/dL — ABNORMAL HIGH (ref 70–99)
Potassium: 5.2 mmol/L — ABNORMAL HIGH (ref 3.5–5.1)
Sodium: 132 mmol/L — ABNORMAL LOW (ref 135–145)

## 2020-03-24 LAB — SARS CORONAVIRUS 2 (TAT 6-24 HRS): SARS Coronavirus 2: NEGATIVE

## 2020-03-24 LAB — PROTIME-INR
INR: 2.8 — ABNORMAL HIGH (ref 0.8–1.2)
Prothrombin Time: 28.4 seconds — ABNORMAL HIGH (ref 11.4–15.2)

## 2020-03-24 MED ORDER — ALPRAZOLAM 0.5 MG PO TABS
0.5000 mg | ORAL_TABLET | Freq: Once | ORAL | Status: AC
  Administered 2020-03-24: 0.5 mg via ORAL

## 2020-03-24 MED ORDER — OXYCODONE HCL 5 MG PO TABS
2.5000 mg | ORAL_TABLET | ORAL | Status: DC | PRN
  Administered 2020-03-25 – 2020-03-28 (×10): 2.5 mg via ORAL
  Filled 2020-03-24 (×10): qty 1

## 2020-03-24 MED ORDER — SODIUM ZIRCONIUM CYCLOSILICATE 10 G PO PACK
10.0000 g | PACK | Freq: Once | ORAL | Status: AC
  Administered 2020-03-24: 10 g via ORAL
  Filled 2020-03-24: qty 1

## 2020-03-24 MED ORDER — SODIUM CHLORIDE 0.45 % IV SOLN: INTRAVENOUS | Status: DC

## 2020-03-24 MED ORDER — WARFARIN SODIUM 1 MG PO TABS
1.0000 mg | ORAL_TABLET | Freq: Once | ORAL | Status: AC
  Administered 2020-03-24: 1 mg via ORAL
  Filled 2020-03-24: qty 1

## 2020-03-24 NOTE — Care Management Important Message (Signed)
Important Message  Patient Details  Name: Deanna [name] MRN: [id number] Date of Birth: 24-Oct-1926   Medicare Important Message Given:  Yes     Shelda Altes 03/24/2020, 8:42 AM

## 2020-03-24 NOTE — Progress Notes (Signed)
       RE:   Deanna [name]      Date of Birth:  1926-10-10     Date:   03/24/2020       To Whom It May Concern:  Please be advised that the above-named patient will require a short-term nursing home stay - anticipated 30 days or less for rehabilitation and strengthening.  The plan is for return home.

## 2020-03-24 NOTE — Consult Note (Signed)
Consultation Note Date: 03/24/2020   Patient Name: Deanna [name]  DOB: [birth year]  MRN: [id number]  Age / Sex: 87 y.o., female  PCP: Lajean Manes, MD Referring Physician: Nolberto Hanlon, MD  Reason for Consultation: Establishing goals of care  HPI/Patient Profile: 87 y.o. female  with past medical history of CHF, Afib, BRCA/mastectomy, tricuspid regurg, osteoarthritis who was admitted on 03/21/2020 with shortness of breath and bilateral lower extremity edema.  She was found to have a heart failure exacerbation and pneumonitis.  She has a thoracic stomach that applies pressure to her lungs worsening her shortness of breath.  The sense is that the pneumonitis is being caused by GERD. Echocardiogram show a tricuspid valve that is "wide open" causing "torrential" regurgitation and right sided heart failure.  Due to CKD and frailty the patient is not a candidate for surgery or invasive cardiac procedures.   Clinical Assessment and Goals of Care:  I have reviewed medical records including EPIC notes, labs and imaging, received report from the bedside RN, examined the patient and met at bedside with her daughter Deanna [name]  to discuss diagnosis prognosis, [place], EOL wishes, disposition and options.  I introduced Palliative Medicine as specialized medical care for people living with serious illness. It focuses on providing relief from the symptoms and stress of a serious illness.   We discussed a brief life review of the patient. [name] had 3 children - her daughter Deanna [name], her son Deanna [name] (?), and her son that passed away on the operating table having heart valve surgery.  PTA she was living at [facility] and enjoyed it there.  She loved socializing and playing cards.  She tells me if she was there today she would be enjoying the [place] Day party.    Deanna [name] tells me that right before Christmas  her mother became ill and has not been well since.  She has suffered with leg swelling and SOB.  Indeed today her lower extremities are swollen and pink.  She describes having them wrapped, unna boots and trying multiple things to help with her legs but they have only become worse.  [name] does not feel she can return to ILF rather she needs ALF on discharge.    She complains of severe HA on the front of her forehead and the center of her head, stomach pain and diarrhea.    As far as functional and nutritional status she has not been eating well and is able to ambulate some with her rolling walker.  We discussed her current illness and what it means in the larger context of her on-going co-morbidities.  Natural disease trajectory and expectations at EOL were discussed.  I explained that her heart valve will not close and remaining open causes right sided heart failure and SOB.  Additionally her intrathoracic stomach applies pressure to the lungs and worsens her SOB.  These are things that we can not fix.  We can improve them some what with medications but we can not fix  them.  I attempted to elicit values and goals of care important to the patient.  The difference between aggressive medical intervention and comfort care was considered in light of the patient's goals of care. Advanced directives, concepts specific to code status, artifical feeding and hydration, and rehospitalization were considered and discussed.  [name] was already a DNR / DNI.  She stated she would prefer to avoid the doctors offices and hospitals and would prefer to have medical care brought to her.  We discussed Hospice services at ALF.  The family is quiet familiar with Hospice as they have had multiple family members on Hospice.    We reviewed a MOST form together and I recommended strongly that it be completed before discharge to ALF.  [name] and Deanna [name] will talk it over together.  Hospice and Palliative Care  services outpatient were explained and offered.  [name] and Deanna [name] would like to take advantage of Hospice services at ALF.    Questions and concerns were addressed.  The family was encouraged to call with questions or concerns.        Primary Decision Maker:  PATIENT with Support of Dorris Fetch (Daugher and HCPOA)    SUMMARY OF RECOMMENDATIONS    Patient would like to DC to [place]. She would like to be followed at ALF by Hospice. Please continue maintenance medications as patient is not comfort care  Code Status/Advance Care Planning:  DNR   Symptom Management:   Heat and ice pack for headache  Xanax for anxiety and diarrhea  Very low dose oxy (2.5 mg) added for moderate pain.   Additional Recommendations (Limitations, Scope, Preferences):  Minimize Medications  Palliative Prophylaxis:   Frequent Pain Assessment, Frequent dyspnea assessment.  Psycho-social/Spiritual:   Desire for further Chaplaincy support: not discussed.  Prognosis:  6 months or less give wide open tricuspid valve causing severe right sided HF, and intrathoracic stomach causing lung pneumonitis and difficulty breathing.  Patient is not a candidate for surgery or invasive cardiac procedures.    Discharge Planning: ALF with Hospice      Primary Diagnoses: Present on Admission:  Hyperlipidemia  Gout  Essential hypertension  ATRIAL FIBRILLATION  Barrett's esophagus   I have reviewed the medical record, interviewed the patient and family, and examined the patient. The following aspects are pertinent.  Past Medical History:  Diagnosis Date   Anxiety    Arthritis    Atrial fibrillation ([place])    Barrett's esophagus    Breast cancer ([place])    Colon polyp 2009   TUBULAR ADENOMA   Diverticulosis of colon (without mention of hemorrhage) 2009   GERD (gastroesophageal reflux disease)    Hyperlipidemia    Hypertension    Status post dilation of  esophageal narrowing    TRICUSPID REGURGITATION 10/23/2006   Social History   Socioeconomic History   Marital status: Married    Spouse name: Not on file   Number of children: 3   Years of education: Not on file   Highest education level: Not on file  Occupational History   Occupation: retired    Fish farm manager: RETIRED  Tobacco Use   Smoking status: Never Smoker   Smokeless tobacco: Never Used  Substance and Sexual Activity   Alcohol use: Yes    Alcohol/week: 5.0 standard drinks    Types: 5 drink(s) per week   Drug use: No   Sexual activity: Not on file  Other Topics Concern   Not on file  Social History Narrative   Not on file   Social Determinants of Health   Financial Resource Strain: Not on file  Food Insecurity: Not on file  Transportation Needs: Not on file  Physical Activity: Not on file  Stress: Not on file  Social Connections: Not on file   Family History  Problem Relation Age of Onset   Colon cancer Mother    Hypertension Mother    Colon cancer Father    Breast cancer Other    Colon polyps Son     Allergies  Allergen Reactions   Aspirin Other (See Comments)    REACTION: nervousness---tolerates ibuprofen   Atorvastatin Other (See Comments)    myalgia      Vital Signs: BP (!) 118/51 (BP Location: Left Arm)    Pulse (!) 48    Temp 98 F (36.7 C) (Oral)    Resp 16    Ht _0  (1.575 m)    Wt 66.3 kg    SpO2 95%    BMI 26.72 kg/m  Pain Scale: 0-10   Pain Score: 6    SpO2: SpO2: 95 % O2 Device:SpO2: 95 % O2 Flow Rate: .O2 Flow Rate (L/min): 3 L/min    Palliative Assessment/Data: 30%     Time In: 1:00 Time Out: 2:05 Time Total: 65 min. Visit consisted of counseling and education dealing with the complex and emotionally intense issues surrounding the need for palliative care and symptom management in the setting of serious and potentially life-threatening illness. Greater than 50%  of this time was spent counseling and  coordinating care related to the above assessment and plan.  Signed by: Florentina Jenny, PA-C Palliative Medicine  Please contact Palliative Medicine Team phone at [phone] for questions and concerns.  For individual provider: See Shea Evans

## 2020-03-24 NOTE — TOC Initial Note (Signed)
Transition of Care [facility]) - Initial/Assessment Note    Patient Details  Name: Deanna [name] MRN: [id number] Date of Birth: March 23, 1926  Transition of [facility]) CM/SW Contact:    Bartholomew Crews, RN Phone Number:  [phone] 03/24/2020, 2:00 PM  Clinical Narrative:                  Spoke with patient and daughter, Hassan Rowan, at the bedside to discuss transition planning. PTA living at [facility]. Hassan Rowan asking that patient receive rehab before returning to [facility]. Reviewed process of PT/OT assessment and recommendations, reaching out to area SNFs for bed offers, bringing bed offers to her and patient for choice, and receiving authorization from insurance for SNF. Discussed going to Medicare.gov website to receive area nursing homes and quality ratings. TOC following for transition needs.   Expected Discharge Plan: Skilled Nursing Facility Barriers to Discharge: Continued Medical Work up   Patient Goals and CMS Choice Patient states their goals for this hospitalization and ongoing recovery are:: would like rehab before returning to independent living CMS Medicare.gov Compare Post Acute Care list provided to:: Patient Choice offered to / list presented to : [place]  Expected Discharge Plan and Services Expected Discharge Plan: [place] In-house Referral: Clinical Social Work Discharge Planning Services: CM Consult Post Acute Care Choice: [place] arrangements for the past 2 months: [place]                 DME Arranged: N/A DME Agency: NA       HH Arranged: NA [place] Agency: NA        Prior Living Arrangements/Services Living arrangements for the past 2 months: [place] Lives with:: Facility Resident Patient language and need for interpreter reviewed:: Yes        Need for Family Participation in Patient Care: Yes (Comment)     Criminal Activity/Legal  Involvement Pertinent to Current Situation/Hospitalization: No - Comment as needed  Activities of Daily Living Home Assistive Devices/Equipment: Environmental consultant (specify type),Hearing aid,Eyeglasses,Dentures (specify type) ADL Screening (condition at time of admission) Patient's cognitive ability adequate to safely complete daily activities?: Yes Is the patient deaf or have difficulty hearing?: Yes Does the patient have difficulty seeing, even when wearing glasses/contacts?: No Does the patient have difficulty concentrating, remembering, or making decisions?: Yes Patient able to express need for assistance with ADLs?: Yes Does the patient have difficulty dressing or bathing?: No Independently performs ADLs?: Yes (appropriate for developmental age) Does the patient have difficulty walking or climbing stairs?: Yes Weakness of Legs: None Weakness of Arms/Hands: None  Permission Sought/Granted Permission sought to share information with : Family Supports Permission granted to share information with : Yes, Verbal Permission Granted  Share Information with NAME: Dorris Fetch     Permission granted to share info w Relationship: daughter  Permission granted to share info w Contact Information: [phone]  Emotional Assessment Appearance:: Appears stated age Attitude/Demeanor/Rapport: Gracious Affect (typically observed): Accepting Orientation: : Oriented to Self,Oriented to  Time,Oriented to Place,Oriented to Situation Alcohol / Substance Use: Not Applicable Psych Involvement: No (comment)  Admission diagnosis:  CHF (congestive heart failure) (HCC) [I50.9] Peripheral edema [R60.9] Hypoxia [R09.02] Community acquired pneumonia of left lower lobe of lung [J18.9] Patient Active Problem List   Diagnosis Date Noted  . Acute respiratory failure with hypoxia ([place]) 03/22/2020  . Pneumonitis 03/22/2020  . Acute CHF (congestive heart failure) ([place]) 03/21/2020  . CHF (congestive heart failure) ([place])  03/21/2020  . Hypoxia   . Hypertensive urgency 09/21/2015  . Chest pain 09/20/2015  . Hypercalcemia 05/09/2015  . Encounter for therapeutic drug monitoring 02/02/2013  . Personal history of colonic polyps 04/07/2012  . Barrett's esophagus 04/07/2012  . Osteoarthritis 02/01/2012  . URI (upper respiratory infection) 01/16/2012  . Gout 12/24/2007  . TRICUSPID REGURGITATION 10/23/2006  . ATRIAL FIBRILLATION 10/15/2006  . HYPERGLYCEMIA 10/15/2006  . Hyperlipidemia 09/23/2006  . ANXIETY 09/23/2006  . Essential hypertension 09/23/2006  . GERD 09/23/2006   PCP:  Lajean Manes, MD Pharmacy:   [id number] - , [address] [place] [address] Phone: [phone] Fax: [phone]  [place], [address], [address] [place], [place] [address] Phone: [phone] Fax: [phone]     Social Determinants of Health (SDOH) Interventions    Readmission Risk Interventions No flowsheet data found.

## 2020-03-24 NOTE — Progress Notes (Signed)
Patient constantly c/o headache  Minimal relieve with tylenol. MD notified no new order given will continue to monitor.

## 2020-03-24 NOTE — [place] FL2 (Cosign Needed Addendum)
[place] MEDICAID FL2 LEVEL OF CARE SCREENING TOOL     IDENTIFICATION  Patient Name: Deanna [name] Birthdate: 24-Sep-1926 Sex: female Admission Date (Current Location): 03/21/2020  [facility] and [place] Number:  Herbalist and Address:  [facility] [place], [place] [address] [place], [place] [address]      Provider Number: [id number]  Attending Physician Name and Address:  Nolberto Hanlon, MD  Relative Name and Phone Number:  Dorris Fetch (Daughter)   [phone] [facility] Phone)    Current Level of Care: Hospital Recommended Level of Care: [place] Prior Approval Number:    Date Approved/Denied:   PASRR Number:  [id number] Discharge Plan: SNF    Current Diagnoses: Patient Active Problem List   Diagnosis Date Noted  . Acute respiratory failure with hypoxia ([place]) 03/22/2020  . Pneumonitis 03/22/2020  . Acute CHF (congestive heart failure) ([place]) 03/21/2020  . CHF (congestive heart failure) ([place]) 03/21/2020  . Hypoxia   . Hypertensive urgency 09/21/2015  . Chest pain 09/20/2015  . Hypercalcemia 05/09/2015  . Encounter for therapeutic drug monitoring 02/02/2013  . Personal history of colonic polyps 04/07/2012  . Barrett's esophagus 04/07/2012  . Osteoarthritis 02/01/2012  . URI (upper respiratory infection) 01/16/2012  . Gout 12/24/2007  . TRICUSPID REGURGITATION 10/23/2006  . ATRIAL FIBRILLATION 10/15/2006  . HYPERGLYCEMIA 10/15/2006  . Hyperlipidemia 09/23/2006  . ANXIETY 09/23/2006  . Essential hypertension 09/23/2006  . GERD 09/23/2006    Orientation RESPIRATION BLADDER Height & Weight     Self,Time,Situation,Place  Normal Continent Weight: 146 lb 1.6 oz (66.3 kg) Height:  5\' 2"  (157.5 cm)  BEHAVIORAL SYMPTOMS/MOOD NEUROLOGICAL BOWEL NUTRITION STATUS      Continent Diet (see d/c summary)  AMBULATORY STATUS COMMUNICATION OF NEEDS Skin   Extensive Assist Verbally Normal                       Personal Care  Assistance Level of Assistance  Bathing,Feeding,Dressing Bathing Assistance: Limited assistance Feeding assistance: Independent Dressing Assistance: Limited assistance     Functional Limitations Info  Hearing,Sight,Speech Sight Info: Adequate Hearing Info: Impaired      SPECIAL CARE FACTORS FREQUENCY  OT (By licensed OT),PT (By licensed PT)     PT Frequency: 5x/week OT Frequency: 5x/week            Contractures Contractures Info: Not present    Additional Factors Info  Code Status,Allergies Code Status Info: DNR Allergies Info: Atorvastatin, Aspirin           Current Medications (03/24/2020):  This is the current hospital active medication list Current Facility-Administered Medications  Medication Dose Route Frequency Provider Last Rate Last Admin  . 0.45 % sodium chloride infusion   Intravenous Continuous Nolberto Hanlon, MD 50 mL/hr at 03/24/20 1004 New Bag at 03/24/20 1004  . 0.9 %  sodium chloride infusion  250 mL Intravenous PRN Wynetta Fines T, MD      . acetaminophen (TYLENOL) tablet 650 mg  650 mg Oral Q4H PRN Lequita Halt, MD   650 mg at 03/24/20 1128  . allopurinol (ZYLOPRIM) tablet 100 mg  100 mg Oral Daily Wynetta Fines T, MD   100 mg at 03/24/20 0942  . ALPRAZolam Duanne Moron) tablet 0.5 mg  0.5 mg Oral TID PRN Arrien, Jimmy Picket, MD   0.5 mg at 03/24/20 1004  . diphenhydrAMINE (BENADRYL) capsule 25 mg  25 mg Oral QHS PRN Zierle-Ghosh, Asia B, DO   25 mg at 03/23/20 2344  .  fenofibrate tablet 160 mg  160 mg Oral Daily Wynetta Fines T, MD   160 mg at 03/24/20 0940  . MEDLINE mouth rinse  15 mL Mouth Rinse BID Wynetta Fines T, MD   15 mL at 03/24/20 0949  . multivitamin with minerals tablet 1 tablet  1 tablet Oral Daily Nolberto Hanlon, MD   1 tablet at 03/24/20 0942  . ondansetron (ZOFRAN) injection 4 mg  4 mg Intravenous Q6H PRN Wynetta Fines T, MD   4 mg at 03/23/20 2030  . pantoprazole (PROTONIX) EC tablet 40 mg  40 mg Oral BID AC Arrien, Jimmy Picket, MD   40 mg at  03/24/20 2355  . pravastatin (PRAVACHOL) tablet 40 mg  40 mg Oral Daily Wynetta Fines T, MD   40 mg at 03/24/20 0940  . sodium chloride flush (NS) 0.9 % injection 3 mL  3 mL Intravenous Q12H Wynetta Fines T, MD   3 mL at 03/24/20 0948  . sodium chloride flush (NS) 0.9 % injection 3 mL  3 mL Intravenous PRN Wynetta Fines T, MD      . sucralfate (CARAFATE) 1 GM/10ML suspension 1 g  1 g Oral TID WC & HS Arrien, Jimmy Picket, MD   1 g at 03/24/20 1129  . warfarin (COUMADIN) tablet 1 mg  1 mg Oral ONCE-1600 Nolberto Hanlon, MD      . Warfarin - Pharmacist Dosing Inpatient   Does not apply q1600 Lavenia Atlas, [facility]         Discharge Medications: Please see discharge summary for a list of discharge medications.  Relevant Imaging Results:  Relevant Lab Results:   Additional Information SSN Joaquin [place], [place]

## 2020-03-24 NOTE — Progress Notes (Signed)
PROGRESS NOTE    Deanna [name]  [name]:[id number] DOB: [birth year] DOA: 03/21/2020 PCP: Lajean Manes, MD    Brief Narrative:  Deanna [name] was admitted to the hospital with a working diagnosis of acute diastolic heart failure exacerbation, complicated with acute pneumonitis and acute hypoxemic respiratory failure.   87 year old female with hypertension, diastolic heart failure, paroxysmal atrial fibrillation, dyslipidemia, osteoarthritis and history of breast cancer status post mastectomy.  Patient reported intermittent dyspnea for about 7 days, associated with orthopnea and cough.  Reported poorly controlled hypertension for the last 2 months.  On her initial physical examination blood pressure 121/49, heart rate 48-53, respiratory rate 20, oxygen saturation 96%.  Positive rales bilaterally, increased work of breathing, heart S1-S2, present, rhythmic, positive systolic murmur at the apex, abdomen soft nontender, positive significant lower extremity edema.   Sodium 137, potassium 5.1, chloride 105, bicarb 23, glucose 172, BUN 64, creatinine 1.93, BNP 150, troponin I 41-45, white count 6.6, hemoglobin 12.4, hematocrit 39.7, platelets 223. SARS COVID-19 negative.  Chest radiograph with cardiomegaly, left lower lobe atelectasis, left lung volume. CT chest with no pericardial effusion, large hiatal hernia with entire stomach up in the chest along with part of the transverse colon.  Adjacent atelectasis to the large hiatal hernia sac.  EKG 52 bpm, normal axis, right bundle branch block, no ST segment changes,  Negative T wave lead II, III and AVf, V4-V3-V4-V5.   3/16-sob little better today. Daughter at bedside.  3/17- daughter at bedside. Pt appears better. palliative care consulted.  Consultants:     Procedures:  1. Left ventricular ejection fraction, by estimation, is 55 to 60%. The  left ventricle has normal function. The left ventricle has no regional  wall motion abnormalities. Left  ventricular diastolic function could not  be evaluated. There is the  interventricular septum is flattened in diastole ('D' shaped left  ventricle), consistent with right ventricular volume overload.  2. Right ventricular systolic function is severely reduced. The right  ventricular size is severely enlarged. There is mildly elevated pulmonary  artery systolic pressure. The estimated right ventricular systolic  pressure is 83.3 mmHg.  3. Left atrial size was severely dilated.  4. Right atrial size was giant.  5. The mitral valve is degenerative. Mild mitral valve regurgitation.  Moderate mitral annular calcification.  6. Severely dilated tricuspid annulus with leaflet malcoaptation and  torrential regurgitation. The tricuspid valve is abnormal. Tricuspid valve  regurgitation wide-open.  7. The aortic valve is grossly normal. Aortic valve regurgitation is  trivial. Mild to moderate aortic valve sclerosis/calcification is present,  without any evidence of aortic stenosis.  8. The inferior vena cava is dilated in size with <50% respiratory  variability, suggesting right atrial pressure of 15 mmHg.   Comparison(s): Prior images unable to be directly viewed, comparison made  by report only. The right ventricular hypertrophy is significantly worse.   Antimicrobials:       Subjective: Per daughter looks better. But pt "moans doesn't feel well" , but unable to tell me again why. She does report drinking water  Objective: Vitals:   03/23/20 1306 03/23/20 1913 03/24/20 0151 03/24/20 0304  BP:  (!) 120/50  (!) 99/49  Pulse:  (!) 48  (!) 51  Resp:  17  18  Temp:  98.4 F (36.9 C)  98.7 F (37.1 C)  TempSrc:  Oral  Oral  SpO2: 94% 90%  91%  Weight:   66.3 kg   Height:  Intake/Output Summary (Last 24 hours) at 03/24/2020 0815 Last data filed at 03/24/2020 0152 Gross per 24 hour  Intake 444 ml  Output 300 ml  Net 144 ml   Filed Weights   03/22/20 0150 03/23/20 0252  03/24/20 0151  Weight: 66.3 kg 65.8 kg 66.3 kg    Examination:  nad cta no r/r/w Regular s1/s2 Soft benign +bs No edema Awake and alert, grossly intact    Data Reviewed: I have personally reviewed following labs and imaging studies  CBC: Recent Labs  Lab 03/21/20 1252 03/22/20 0301 03/23/20 0404  WBC 6.6 5.0 5.3  NEUTROABS 4.4  --   --   HGB 12.4 11.8* 12.1  HCT 39.7 36.2 37.2  MCV 91.9 91.4 90.7  PLT 223 199 676   Basic Metabolic Panel: Recent Labs  Lab 03/21/20 1252 03/22/20 0301 03/23/20 0404 03/24/20 0411  NA 137 137 135 132*  K 5.1 5.2* 4.8 5.2*  CL 105 104 99 99  CO2 23 26 27 25   GLUCOSE 172* 138* 106* 118*  BUN 64* 69* 68* 71*  CREATININE 1.93* 2.16* 2.11* 2.34*  CALCIUM 9.6 9.3 9.3 9.1   GFR: Estimated Creatinine Clearance: 14 mL/min (A) (by C-G formula based on SCr of 2.34 mg/dL (H)). Liver Function Tests: Recent Labs  Lab 03/21/20 1252  AST 32  ALT 19  ALKPHOS 60  BILITOT 1.7*  PROT 6.8  ALBUMIN 3.4*   No results for input(s): LIPASE, AMYLASE in the last 168 hours. No results for input(s): AMMONIA in the last 168 hours. Coagulation Profile: Recent Labs  Lab 03/21/20 1252 03/22/20 0301 03/23/20 0404 03/24/20 0411  INR 2.3* 2.5* 2.4* 2.8*   Cardiac Enzymes: No results for input(s): CKTOTAL, CKMB, CKMBINDEX, TROPONINI in the last 168 hours. BNP (last 3 results) No results for input(s): PROBNP in the last 8760 hours. HbA1C: Recent Labs    03/22/20 0301  HGBA1C 7.5*   CBG: No results for input(s): GLUCAP in the last 168 hours. Lipid Profile: No results for input(s): CHOL, HDL, LDLCALC, TRIG, CHOLHDL, LDLDIRECT in the last 72 hours. Thyroid Function Tests: No results for input(s): TSH, T4TOTAL, FREET4, T3FREE, THYROIDAB in the last 72 hours. Anemia Panel: No results for input(s): VITAMINB12, FOLATE, FERRITIN, TIBC, IRON, RETICCTPCT in the last 72 hours. Sepsis Labs: Recent Labs  Lab 03/21/20 1427  LATICACIDVEN 0.9     Recent Results (from the past 240 hour(s))  Resp Panel by RT-PCR (Flu A&B, Covid) Nasopharyngeal Swab     Status: None   Collection Time: 03/21/20 12:54 PM   Specimen: Nasopharyngeal Swab; Nasopharyngeal(NP) swabs in vial transport medium  Result Value Ref Range Status   SARS Coronavirus 2 by RT PCR NEGATIVE NEGATIVE Final    Comment: (NOTE) SARS-CoV-2 target nucleic acids are NOT DETECTED.  The SARS-CoV-2 RNA is generally detectable in upper respiratory specimens during the acute phase of infection. The lowest concentration of SARS-CoV-2 viral copies this assay can detect is 138 copies/mL. A negative result does not preclude SARS-Cov-2 infection and should not be used as the sole basis for treatment or other patient management decisions. A negative result may occur with  improper specimen collection/handling, submission of specimen other than nasopharyngeal swab, presence of viral mutation(s) within the areas targeted by this assay, and inadequate number of viral copies(<138 copies/mL). A negative result must be combined with clinical observations, patient history, and epidemiological information. The expected result is Negative.  Fact Sheet for Patients:  EntrepreneurPulse.com.au  Fact Sheet for Healthcare Providers:  IncredibleEmployment.be  This test is no t yet approved or cleared by the [place] and  has been authorized for detection and/or diagnosis of SARS-CoV-2 by FDA under an Emergency Use Authorization (EUA). This EUA will remain  in effect (meaning this test can be used) for the duration of the COVID-19 declaration under Section 564(b)(1) of the Act, 21 U.S.C.section 360bbb-3(b)(1), unless the authorization is terminated  or revoked sooner.       Influenza A by PCR NEGATIVE NEGATIVE Final   Influenza B by PCR NEGATIVE NEGATIVE Final    Comment: (NOTE) The Xpert Xpress SARS-CoV-2/FLU/RSV plus assay is intended as an  aid in the diagnosis of influenza from Nasopharyngeal swab specimens and should not be used as a sole basis for treatment. Nasal washings and aspirates are unacceptable for Xpert Xpress SARS-CoV-2/FLU/RSV testing.  Fact Sheet for Patients: EntrepreneurPulse.com.au  Fact Sheet for Healthcare Providers: IncredibleEmployment.be  This test is not yet approved or cleared by the [place] FDA and has been authorized for detection and/or diagnosis of SARS-CoV-2 by FDA under an Emergency Use Authorization (EUA). This EUA will remain in effect (meaning this test can be used) for the duration of the COVID-19 declaration under Section 564(b)(1) of the Act, 21 U.S.C. section 360bbb-3(b)(1), unless the authorization is terminated or revoked.  Performed at [facility], [place] [address], [place], [place] [address]   Culture, blood (routine x 2)     Status: None (Preliminary result)   Collection Time: 03/21/20  2:27 PM   Specimen: BLOOD RIGHT ARM  Result Value Ref Range Status   Specimen Description BLOOD RIGHT ARM  Final   Special Requests   Final    BOTTLES DRAWN AEROBIC AND ANAEROBIC Blood Culture adequate volume   Culture   Final    NO GROWTH 3 DAYS Performed at [facility], [place] [address], [place], [place] [address]    Report Status PENDING  Incomplete  Culture, blood (Routine X 2) w Reflex to ID Panel     Status: None (Preliminary result)   Collection Time: 03/22/20  3:01 AM   Specimen: BLOOD  Result Value Ref Range Status   Specimen Description BLOOD RIGHT ANTECUBITAL  Final   Special Requests   Final    BOTTLES DRAWN AEROBIC ONLY Blood Culture adequate volume   Culture   Final    NO GROWTH 2 DAYS Performed at [facility], [place] [address] [place], [place] [address]    Report Status PENDING  Incomplete         Radiology Studies: DG Swallowing Func-Speech Pathology  Result Date: 03/23/2020 Objective Swallowing  Evaluation: Type of Study: MBS-Modified Barium Swallow Study  Patient Details Name: CRISTIANNA CYR MRN: [id number] Date of Birth: 1926/02/08 Today's Date: 03/23/2020 Time: SLP Start Time (ACUTE ONLY): 5188 -SLP Stop Time (ACUTE ONLY): 4166 SLP Time Calculation (min) (ACUTE ONLY): 16 min Past Medical History: Past Medical History: Diagnosis Date  Anxiety   Arthritis   Atrial fibrillation ([place])   Barrett's esophagus   Breast cancer ([place])   Colon polyp 2009  TUBULAR ADENOMA  Diverticulosis of colon (without mention of hemorrhage) 2009  GERD (gastroesophageal reflux disease)   Hyperlipidemia   Hypertension   Status post dilation of esophageal narrowing   TRICUSPID REGURGITATION 10/23/2006 Past Surgical History: Past Surgical History: Procedure Laterality Date  ABDOMINAL HYSTERECTOMY    bcc-face    CATARACT EXTRACTION Bilateral   MASTECTOMY Bilateral  HPI: Patient is a 87 y/o  female with a working diagnosis of acute diastolic heart failure exacerbation, complicated with acute pneumonitis and acute hypoxemic respiratory failure. CXR 3/14: Left lower lobe consolidation medially, likely due to pneumonia. CT Chest 3/14: large hiatal hernia with the entire stomach up in the chest along with part of the transverse colon; No acute pulmonary findings or worrisome pulmonary lesions. PMH includes GERD, Barrett's esophagus, HTN, breast ca, A-fib, tricuspid regurgitation.  Subjective: Pt was pleasant and agreeable Assessment / Plan / Recommendation CHL IP CLINICAL IMPRESSIONS 03/23/2020 Clinical Impression Pt presents with an oropharyngeal swallow that is [facility] in which she demonstrated appropriate airway protection of all consistencies. Her oral phase was characterized by multiple pumps to propel the boluses back with solid consistencies; but was overall functional and timely with no residue noted. Instances of penetration (PAS 2) were noted with larger consecutive sips of thin liquids; however, the pentrates cleared  during her swallow. Her swallow initation for thin liquids occured at the pyiform sinuses but it was still timely and efficient. Provided education and a handout on esophageal precautions. MD may want to consider consultation with GI if her esophageal symptoms continue to persist (belching was noted during her assessment this morning as well as after her MBS). Given these results, recommend a regular diet and thin liquids - SLP will sign off at this time.  SLP Visit Diagnosis Dysphagia, unspecified (R13.10) Attention and concentration deficit following -- Frontal lobe and executive function deficit following -- Impact on safety and function Mild aspiration risk   CHL IP TREATMENT RECOMMENDATION 03/23/2020 Treatment Recommendations No treatment recommended at this time   Prognosis 03/23/2020 Prognosis for Safe Diet Advancement Good Barriers to Reach Goals -- Barriers/Prognosis Comment -- CHL IP DIET RECOMMENDATION 03/23/2020 SLP Diet Recommendations Regular solids;Thin liquid Liquid Administration via Cup;Straw Medication Administration Whole meds with liquid Compensations Slow rate;Small sips/bites;Follow solids with liquid Postural Changes Remain semi-upright after after feeds/meals (Comment);Seated upright at 90 degrees   CHL IP OTHER RECOMMENDATIONS 03/23/2020 Recommended Consults Consider GI evaluation Oral Care Recommendations Oral care BID Other Recommendations --   CHL IP FOLLOW UP RECOMMENDATIONS 03/23/2020 Follow up Recommendations None   No flowsheet data found.     CHL IP ORAL PHASE 03/23/2020 Oral Phase WFL Oral - Pudding Teaspoon -- Oral - Pudding Cup -- Oral - Honey Teaspoon -- Oral - Honey Cup -- Oral - Nectar Teaspoon -- Oral - Nectar Cup -- Oral - Nectar Straw -- Oral - Thin Teaspoon -- Oral - Thin Cup -- Oral - Thin Straw -- Oral - Puree -- Oral - Mech Soft -- Oral - Regular -- Oral - Multi-Consistency -- Oral - Pill -- Oral Phase - Comment --  CHL IP PHARYNGEAL PHASE 03/23/2020 Pharyngeal Phase Impaired  Pharyngeal- Pudding Teaspoon -- Pharyngeal -- Pharyngeal- Pudding Cup -- Pharyngeal -- Pharyngeal- Honey Teaspoon -- Pharyngeal -- Pharyngeal- Honey Cup -- Pharyngeal -- Pharyngeal- Nectar Teaspoon -- Pharyngeal -- Pharyngeal- Nectar Cup -- Pharyngeal -- Pharyngeal- Nectar Straw -- Pharyngeal -- Pharyngeal- Thin Teaspoon -- Pharyngeal -- Pharyngeal- Thin Cup WFL Pharyngeal -- Pharyngeal- Thin Straw Penetration/Aspiration before swallow Pharyngeal Material enters airway, remains ABOVE vocal cords then ejected out Pharyngeal- Puree WFL Pharyngeal -- Pharyngeal- Mechanical Soft WFL Pharyngeal -- Pharyngeal- Regular -- Pharyngeal -- Pharyngeal- Multi-consistency -- Pharyngeal -- Pharyngeal- Pill WFL Pharyngeal -- Pharyngeal Comment --  CHL IP CERVICAL ESOPHAGEAL PHASE 03/23/2020 Cervical Esophageal Phase WFL Pudding Teaspoon -- Pudding Cup -- Honey Teaspoon -- Honey Cup -- Nectar Teaspoon -- Nectar Cup -- Nectar Straw --  Thin Teaspoon -- Thin Cup -- Thin Straw -- Puree -- Mechanical Soft -- Regular -- Multi-consistency -- Pill -- Cervical Esophageal Comment -- Note populated for Lebron Conners, Student SLP Osie Bond., M.A. [place] Pager [phone] Office [phone] 03/23/2020, 4:00 PM              ECHOCARDIOGRAM COMPLETE  Result Date: 03/22/2020    ECHOCARDIOGRAM REPORT   Patient Name:   ENIYA CANNADY Date of Exam: 03/22/2020 Medical Rec #:  [id number]      Height:       62.0 in Accession #:    [id number]     Weight:       146.2 lb Date of Birth:  [birth year]      BSA:          1.673 m Patient Age:    81 years       BP:           143/55 mmHg Patient Gender: F              HR:           79 bpm. Exam Location:  Inpatient Procedure: 2D Echo, Cardiac Doppler and Color Doppler Indications:    CHF-Acute Diastolic  History:        Patient has prior history of Echocardiogram examinations, most                 recent 09/22/2015. CHF, Arrythmias:Atrial Fibrillation; Risk                  Factors:Dyslipidemia and Hypertension. Tricuspid regurgitation.                 GERD. Bilateral mastectomies with bilateral breast implants.  Sonographer:    Clayton Lefort RDCS (AE) Referring Phys: [id number] Lequita Halt  Sonographer Comments: Technically challenging study due to limited acoustic windows, Technically difficult study due to poor echo windows and no parasternal window. Image acquisition challenging due to breast implants and Image acquisition challenging due to mastectomy. Attempts at parasternal images later in study were taken from apical position. IMPRESSIONS  1. Left ventricular ejection fraction, by estimation, is 55 to 60%. The left ventricle has normal function. The left ventricle has no regional wall motion abnormalities. Left ventricular diastolic function could not be evaluated. There is the interventricular septum is flattened in diastole ('D' shaped left ventricle), consistent with right ventricular volume overload.  2. Right ventricular systolic function is severely reduced. The right ventricular size is severely enlarged. There is mildly elevated pulmonary artery systolic pressure. The estimated right ventricular systolic pressure is 92.3 mmHg.  3. Left atrial size was severely dilated.  4. Right atrial size was giant.  5. The mitral valve is degenerative. Mild mitral valve regurgitation. Moderate mitral annular calcification.  6. Severely dilated tricuspid annulus with leaflet malcoaptation and torrential regurgitation. The tricuspid valve is abnormal. Tricuspid valve regurgitation wide-open.  7. The aortic valve is grossly normal. Aortic valve regurgitation is trivial. Mild to moderate aortic valve sclerosis/calcification is present, without any evidence of aortic stenosis.  8. The inferior vena cava is dilated in size with <50% respiratory variability, suggesting right atrial pressure of 15 mmHg. Comparison(s): Prior images unable to be directly viewed, comparison made by report only. The  right ventricular hypertrophy is significantly worse. FINDINGS  Left Ventricle: Left ventricular ejection fraction, by estimation, is 55 to 60%. The left ventricle has normal function. The left ventricle has no regional wall motion abnormalities.  The left ventricular internal cavity size was normal in size. There is  no left ventricular hypertrophy. The interventricular septum is flattened in diastole ('D' shaped left ventricle), consistent with right ventricular volume overload. Left ventricular diastolic function could not be evaluated due to atrial fibrillation. Left ventricular diastolic function could not be evaluated. Right Ventricle: The right ventricular size is severely enlarged. No increase in right ventricular wall thickness. Right ventricular systolic function is severely reduced. There is mildly elevated pulmonary artery systolic pressure. The tricuspid regurgitant velocity is 2.13 m/s, and with an assumed right atrial pressure of 15 mmHg, the estimated right ventricular systolic pressure is 44.8 mmHg. Left Atrium: Left atrial size was severely dilated. Right Atrium: Right atrial size was giant. Pericardium: There is no evidence of pericardial effusion. Mitral Valve: The mitral valve is degenerative in appearance. There is moderate thickening of the mitral valve leaflet(s). Moderate mitral annular calcification. Mild mitral valve regurgitation, with centrally-directed jet. MV peak gradient, 7.5 mmHg. The mean mitral valve gradient is 2.0 mmHg. Tricuspid Valve: Severely dilated tricuspid annulus with leaflet malcoaptation and torrential regurgitation. The tricuspid valve is abnormal. Tricuspid valve regurgitation wide-open. Aortic Valve: The aortic valve is grossly normal. Aortic valve regurgitation is trivial. Mild to moderate aortic valve sclerosis/calcification is present, without any evidence of aortic stenosis. Aortic valve mean gradient measures 4.3 mmHg. Aortic valve  peak gradient measures 7.8  mmHg. Pulmonic Valve: The pulmonic valve was not well visualized. Pulmonic valve regurgitation is not visualized. Aorta: The aortic root is normal in size and structure. Venous: The inferior vena cava is dilated in size with less than 50% respiratory variability, suggesting right atrial pressure of 15 mmHg. IAS/Shunts: No atrial level shunt detected by color flow Doppler.  RIGHT VENTRICLE            IVC RV Basal diam:  5.50 cm    IVC diam: 3.40 cm RV Mid diam:    5.10 cm RV S prime:     7.00 cm/s TAPSE (M-mode): 1.6 cm LEFT ATRIUM              Index       RIGHT ATRIUM           Index LA Vol (A2C):   86.7 ml  51.82 ml/m RA Area:     47.10 cm LA Vol (A4C):   95.9 ml  57.32 ml/m RA Volume:   209.00 ml 124.91 ml/m LA Biplane Vol: 101.0 ml 60.36 ml/m  AORTIC VALVE AV Vmax:           139.33 cm/s AV Vmean:          95.833 cm/s AV VTI:            0.300 m AV Peak Grad:      7.8 mmHg AV Mean Grad:      4.3 mmHg LVOT Vmax:         62.82 cm/s LVOT Vmean:        42.175 cm/s LVOT VTI:          0.141 m LVOT/AV VTI ratio: 0.47 MITRAL VALVE                TRICUSPID VALVE MV Area (PHT): 3.85 cm     TR Peak grad:   18.1 mmHg MV Peak grad:  7.5 mmHg     TR Vmax:        213.00 cm/s MV Mean grad:  2.0 mmHg MV Vmax:       1.37  m/s     SHUNTS MV Vmean:      55.0 cm/s    Systemic VTI: 0.14 m MV Decel Time: 197 msec MV E velocity: 123.00 cm/s Mihai Croitoru MD Electronically signed by Sanda Klein MD Signature Date/Time: 03/22/2020/2:23:18 PM    Final         Scheduled Meds:  allopurinol  100 mg Oral Daily   fenofibrate  160 mg Oral Daily   mouth rinse  15 mL Mouth Rinse BID   multivitamin with minerals  1 tablet Oral Daily   pantoprazole  40 mg Oral BID AC   pravastatin  40 mg Oral Daily   sodium chloride flush  3 mL Intravenous Q12H   sodium zirconium cyclosilicate  10 g Oral Once   sucralfate  1 g Oral TID WC & HS   warfarin  1 mg Oral ONCE-1600   Warfarin - Pharmacist Dosing Inpatient   Does not apply  q1600   Continuous Infusions:  sodium chloride     sodium chloride      Assessment & Plan:   Principal Problem:   Acute respiratory failure with hypoxia (HCC) Active Problems:   Hyperlipidemia   Gout   Essential hypertension   ATRIAL FIBRILLATION   Barrett's esophagus   Acute CHF (congestive heart failure) (HCC)   Pneumonitis   1. Acute hypoxemic respiratory failure, combination of cardiogenic pulmonary edema (decomepensated chronic diastolic heart failure) and pneumonitis due to aspiration.  Patient with large hiatal hernia and risk for aspiration and aspiration pneumonitis. Her volume status has improved, documented urine output over last 24 hrs is 800, and blood pressure has been low with systolic 90 to 093 mmHg.  Continue on supplemental 02, currently on 4 l/min per [place] with oxygen saturation at  99%.  3/17-on RA  88-91% Clinically improved. More on dry side will give little hydration Aspiration precaution-PPI IS Continue to Hold diuretics Cards following Palliative care consulted   2. Paroxysmal atrial fibrillation.  Has junctional brady - 2/2 digoxin. Dig was d/c'd Continue warfarin    3. HTN. bp still on low side Continue to hold meds    4. AKI on CKD stage 3a/ hyperkalemia. Renal function with worsening cr up to 2,16 with K at 5,2 and serum bicarbonate at 26. 3/17- creatinine up mildly today Continue to hold lasix Encouraged po intake Will give gentle hydration    5. Dyslipidemia.  Continue pravastatin and fenofibrate    6. Gout.  No acute flares Continue allopurinol   7. Anxiety.  As needed alprazolam   PT-recommend home health  DVT prophylaxis: coumadin Code Status:DNR Family Communication: daughter at bedside  Status is: Inpatient  Remains inpatient appropriate because:Inpatient level of care appropriate due to severity of illness   Dispo: The patient is from: Home              Anticipated d/c is to: Home               Patient currently is not medically stable to d/c.   Difficult to place patient No anticipitated d/c 1-2 days. Need to wean off 02. Palliative care consulted. Need ivf.            LOS: 3 days   Time spent: 35 min with >50% on coc    Nolberto Hanlon, MD [facility] Pager [phone]  If 7PM-7AM, please contact night-coverage 03/24/2020, 8:15 AM Patient ID: Deanna [name], female   DOB: November 20, 1926, 87 y.o.   MRN: [id number]

## 2020-03-24 NOTE — Progress Notes (Signed)
Patient is weak but able to get on [facility] with assisstant

## 2020-03-24 NOTE — TOC Progression Note (Addendum)
Transition of Care [facility]) - Progression Note    Patient Details  Name: Deanna [name] MRN: [id number] Date of Birth: 07/31/1926  Transition of Care [facility]) CM/SW [place], [place] Phone Number: 03/24/2020, 2:45 PM  Clinical Narrative:      Josem Kaufmann started; clinicals faxed to [facility] [employer] HQU#[id number].   Passr pending; additional documents uploaded to [facility] must.   1610:  PASSR Received [id number]  Expected Discharge Plan: [place] Barriers to Discharge: Continued Medical Work up  Expected Discharge Plan and Services Expected Discharge Plan: [place] In-house Referral: Clinical Social Work Discharge Planning Services: CM Consult Post Acute Care Choice: [place] arrangements for the past 2 months: [place]                 DME Arranged: N/A DME Agency: NA       HH Arranged: NA [place] Agency: NA         Social Determinants of Health (SDOH) Interventions    Readmission Risk Interventions No flowsheet data found.

## 2020-03-24 NOTE — Progress Notes (Signed)
Physical Therapy Treatment Patient Details Name: Deanna [name] MRN: [id number] DOB: 1926/12/25 Today's Date: 03/24/2020    History of Present Illness Patient is a 87 y/o female admitted 03/21/20 with SOB and BLE edema. Workup for acute respiratory failure secondary to acute on chronic diastolic CHF decompensation likely due to uncontrolled HTN and under-diuresis. PMH includes HTN, breast ca, A-fib, tricuspid regurgitation.   PT Comments    Pt progressing well with mobility; remains limited by generalized weakness, decreased activity tolerance and impaired balance, requiring increased assist for mobility and ADL tasks. Today's session focused on gait training with rollator, which pt uses at baseline. Increased time discussing pt's d/c plan and pt's concerns regarding her return to ILF since she lives alone and needs to be functioning independently. Pt requesting SNF-level therapies which would be appropriate based on her current deficits with functional mobility; SW notified. Will continue to follow acutely to address established goals.  SpO2 88-91% on RA, HR 56    Follow Up Recommendations  SNF;Supervision for mobility/OOB     Equipment Recommendations  None recommended by PT    Recommendations for Other Services       Precautions / Restrictions Precautions Precautions: Fall Precaution Comments: watch O2, HR (bradycardia) Restrictions Weight Bearing Restrictions: No    Mobility  Bed Mobility               General bed mobility comments: Received sitting EOB    Transfers Overall transfer level: Needs assistance Equipment used: 4-wheeled walker Transfers: Sit to/from Stand Sit to Stand: Supervision;Min guard         General transfer comment: Supervision with sit<>stand from EOB, assist to lock rollator brakes; min guard standing from toilet height, reliant on grab bar to pull into standing  Ambulation/Gait Ambulation/Gait assistance: Min guard;Min assist Gait  Distance (Feet): 120 Feet Assistive device: 4-wheeled walker Gait Pattern/deviations: Step-through pattern;Decreased stride length;Trunk flexed Gait velocity: Decreased   General Gait Details: Slow gait with rollator and intermittent min guard for balance; 1x standing rest break due to fatigue and DOE. SpO2 88-91% on RA. Ambulating short distance in room without DME, requiring min guard to minA for balance, pt reaching to furniture for support   Stairs             Wheelchair Mobility    Modified Rankin (Stroke Patients Only)       Balance Overall balance assessment: Needs assistance Sitting-balance support: Feet supported;Bilateral upper extremity supported Sitting balance-Leahy Scale: Fair       Standing balance-Leahy Scale: Poor Standing balance comment: Reliant on UE support or external assist; assist for pericare while standing                            Cognition Arousal/Alertness: Awake/alert Behavior During Therapy: WFL for tasks assessed/performed Overall Cognitive Status: Within Functional Limits for tasks assessed                                 General Comments: WFL for simple tasks; requires repetition due to [facility]      Exercises      General Comments General comments (skin integrity, edema, etc.): Pt's daughter Hassan Rowan) present and supportive; has been helping pt up to the bathroom. Increased time discussing pt's discharge plan and concerns regarding her return to [place] apartment alone since she needs to be independent; pt requesting SNF-level therapies which would be  appropriate based on her current deficits with functional mobility; SW notified      Pertinent Vitals/Pain Pain Assessment: Faces Faces Pain Scale: Hurts a little bit Pain Location: Head Pain Descriptors / Indicators: Headache Pain Intervention(s): Monitored during session;Premedicated before session    Home Living                      Prior Function             PT Goals (current goals can now be found in the care plan section) Progress towards PT goals: Progressing toward goals    Frequency    Min 3X/week      PT Plan Discharge plan needs to be updated    Co-evaluation              AM-PAC PT "6 Clicks" Mobility   Outcome Measure  Help needed turning from your back to your side while in a flat bed without using bedrails?: A Little Help needed moving from lying on your back to sitting on the side of a flat bed without using bedrails?: A Little Help needed moving to and from a bed to a chair (including a wheelchair)?: A Little Help needed standing up from a chair using your arms (e.g., wheelchair or bedside chair)?: A Little Help needed to walk in hospital room?: A Little Help needed climbing 3-5 steps with a railing? : A Lot 6 Click Score: 17    End of Session Equipment Utilized During Treatment: Gait belt Activity Tolerance: Patient tolerated treatment well Patient left: in bed;with call bell/phone within reach;with family/visitor present (sitting EOB) Nurse Communication: Mobility status PT Visit Diagnosis: Muscle weakness (generalized) (M62.81);Difficulty in walking, not elsewhere classified (R26.2);Other (comment)     Time: 7494-4967 PT Time Calculation (min) (ACUTE ONLY): 29 min  Charges:  $Gait Training: 8-22 mins                     Mabeline Caras, PT, DPT Acute Rehabilitation Services  Pager [phone] Office [place] 03/24/2020, 11:47 AM

## 2020-03-24 NOTE — Progress Notes (Signed)
ANTICOAGULATION CONSULT NOTE - Follow Up Consult  Pharmacy Consult for Warfarin Indication: atrial fibrillation  Allergies  Allergen Reactions  . Aspirin Other (See Comments)    REACTION: nervousness---tolerates ibuprofen  . Atorvastatin Other (See Comments)    myalgia    Patient Measurements: Height: 5\' 2"  (157.5 cm) Weight: 66.3 kg (146 lb 1.6 oz) IBW/kg (Calculated) : 50.1  Vital Signs: Temp: 98.7 F (37.1 C) (03/17 0304) Temp Source: Oral (03/17 0304) BP: 99/49 (03/17 0304) Pulse Rate: 51 (03/17 0304)  Labs: Recent Labs    03/21/20 1252 03/21/20 1427 03/22/20 0301 03/23/20 0404 03/24/20 0411  HGB 12.4  --  11.8* 12.1  --   HCT 39.7  --  36.2 37.2  --   PLT 223  --  199 203  --   LABPROT 24.4*  --  25.9* 25.6* 28.4*  INR 2.3*  --  2.5* 2.4* 2.8*  CREATININE 1.93*  --  2.16* 2.11* 2.34*  TROPONINIHS 41* 45*  --   --   --     Estimated Creatinine Clearance: 14 mL/min (A) (by C-G formula based on SCr of 2.34 mg/dL (H)).  Assessment: Pt is a 22 YOF with history of Afib on warfarin PTA presented 03/21/20 with acute CHF. Pharmacy consulted for Warfarin dosing.  Per patient, INR has recently been high and regimen has been changing frequently. Pt reports latest regimen was 1 alternating with 2 mg, and 1 mg taken PTA on 03/20/20.   INR is therapeutic at 2.8. CBC stable on last check. Oral intake documented between 10-85%.   Goal of Therapy:  INR 2-3 Monitor platelets by anticoagulation protocol: Yes   Plan:  Warfarin 1 mg tonight Daily PT/INR for now Next CBC 3/18  Antonietta Jewel, PharmD, [place] Pharmacist  Phone: [phone] 03/24/2020 7:57 AM  Please check AMION for all [place] phone numbers After 10:00 PM, call [place] [phone]

## 2020-03-25 LAB — BASIC METABOLIC PANEL
Anion gap: 9 (ref 5–15)
BUN: 75 mg/dL — ABNORMAL HIGH (ref 8–23)
CO2: 26 mmol/L (ref 22–32)
Calcium: 8.7 mg/dL — ABNORMAL LOW (ref 8.9–10.3)
Chloride: 94 mmol/L — ABNORMAL LOW (ref 98–111)
Creatinine, Ser: 2.44 mg/dL — ABNORMAL HIGH (ref 0.44–1.00)
GFR, Estimated: 18 mL/min — ABNORMAL LOW (ref >60)
Glucose, Bld: 152 mg/dL — ABNORMAL HIGH (ref 70–99)
Potassium: 4.9 mmol/L (ref 3.5–5.1)
Sodium: 129 mmol/L — ABNORMAL LOW (ref 135–145)

## 2020-03-25 LAB — CBC
HCT: 35.8 % — ABNORMAL LOW (ref 36.0–46.0)
Hemoglobin: 11.6 g/dL — ABNORMAL LOW (ref 12.0–15.0)
MCH: 29.7 pg (ref 26.0–34.0)
MCHC: 32.4 g/dL (ref 30.0–36.0)
MCV: 91.6 fL (ref 80.0–100.0)
Platelets: 173 10*3/uL (ref 150–400)
RBC: 3.91 MIL/uL (ref 3.87–5.11)
RDW: 17.4 % — ABNORMAL HIGH (ref 11.5–15.5)
WBC: 6.5 10*3/uL (ref 4.0–10.5)
nRBC: 0 % (ref 0.0–0.2)

## 2020-03-25 LAB — PROTIME-INR
INR: 2.7 — ABNORMAL HIGH (ref 0.8–1.2)
Prothrombin Time: 28 seconds — ABNORMAL HIGH (ref 11.4–15.2)

## 2020-03-25 MED ORDER — FUROSEMIDE 20 MG PO TABS
20.0000 mg | ORAL_TABLET | Freq: Every day | ORAL | Status: DC
  Administered 2020-03-25 – 2020-03-28 (×4): 20 mg via ORAL
  Filled 2020-03-25 (×4): qty 1

## 2020-03-25 MED ORDER — WARFARIN SODIUM 2 MG PO TABS
2.0000 mg | ORAL_TABLET | Freq: Once | ORAL | Status: AC
  Administered 2020-03-25: 2 mg via ORAL
  Filled 2020-03-25: qty 1

## 2020-03-25 NOTE — Plan of Care (Signed)
  Problem: Elimination: ?Goal: Will not experience complications related to bowel motility ?Outcome: Completed/Met ?  ?Problem: Elimination: ?Goal: Will not experience complications related to urinary retention ?Outcome: Completed/Met ?  ?

## 2020-03-25 NOTE — Progress Notes (Addendum)
PROGRESS NOTE    Deanna [name]  TDD:[id number] DOB: 10/24/1926 DOA: 03/21/2020 PCP: Lajean Manes, MD    Brief Narrative:  Deanna [name] was admitted to the hospital with a working diagnosis of acute diastolic heart failure exacerbation, complicated with acute pneumonitis and acute hypoxemic respiratory failure.   86 year old female with hypertension, diastolic heart failure, paroxysmal atrial fibrillation, dyslipidemia, osteoarthritis and history of breast cancer status post mastectomy.  Patient reported intermittent dyspnea for about 7 days, associated with orthopnea and cough.  Reported poorly controlled hypertension for the last 2 months.  On her initial physical examination blood pressure 121/49, heart rate 48-53, respiratory rate 20, oxygen saturation 96%.  Positive rales bilaterally, increased work of breathing, heart S1-S2, present, rhythmic, positive systolic murmur at the apex, abdomen soft nontender, positive significant lower extremity edema.   Sodium 137, potassium 5.1, chloride 105, bicarb 23, glucose 172, BUN 64, creatinine 1.93, BNP 150, troponin I 41-45, white count 6.6, hemoglobin 12.4, hematocrit 39.7, platelets 223. SARS COVID-19 negative.  Chest radiograph with cardiomegaly, left lower lobe atelectasis, left lung volume. CT chest with no pericardial effusion, large hiatal hernia with entire stomach up in the chest along with part of the transverse colon.  Adjacent atelectasis to the large hiatal hernia sac.  EKG 52 bpm, normal axis, right bundle branch block, no ST segment changes,  Negative T wave lead II, III and AVf, V4-V3-V4-V5.   3/16-sob little better today. Daughter at bedside.  3/17- daughter at bedside. Pt appears better. palliative care consulted. 3/18-bedside.  Tells me they have decided on going to assisted living with hospice.  They had a discussion with palliative care yesterday  Consultants:  Palliative   Procedures:  1. Left ventricular ejection  fraction, by estimation, is 55 to 60%. The  left ventricle has normal function. The left ventricle has no regional  wall motion abnormalities. Left ventricular diastolic function could not  be evaluated. There is the  interventricular septum is flattened in diastole ('D' shaped left  ventricle), consistent with right ventricular volume overload.  2. Right ventricular systolic function is severely reduced. The right  ventricular size is severely enlarged. There is mildly elevated pulmonary  artery systolic pressure. The estimated right ventricular systolic  pressure is 62.3 mmHg.  3. Left atrial size was severely dilated.  4. Right atrial size was giant.  5. The mitral valve is degenerative. Mild mitral valve regurgitation.  Moderate mitral annular calcification.  6. Severely dilated tricuspid annulus with leaflet malcoaptation and  torrential regurgitation. The tricuspid valve is abnormal. Tricuspid valve  regurgitation wide-open.  7. The aortic valve is grossly normal. Aortic valve regurgitation is  trivial. Mild to moderate aortic valve sclerosis/calcification is present,  without any evidence of aortic stenosis.  8. The inferior vena cava is dilated in size with <50% respiratory  variability, suggesting right atrial pressure of 15 mmHg.   Comparison(s): Prior images unable to be directly viewed, comparison made  by report only. The right ventricular hypertrophy is significantly worse.   Antimicrobials:       Subjective: Per daughter patient has been eating more.  Has been hydrating.  Has some shortness of breath.   Objective: Vitals:   03/24/20 1113 03/24/20 1924 03/25/20 0258 03/25/20 0351  BP: (!) 118/51 (!) 116/45  (!) 113/48  Pulse: (!) 48 (!) 46  (!) 45  Resp: 16 18    Temp: 98 F (36.7 C) 98.2 F (36.8 C)    TempSrc: Oral Oral    SpO2:  95% 97%    Weight:   68.2 kg   Height:        Intake/Output Summary (Last 24 hours) at 03/25/2020 0813 Last data  filed at 03/25/2020 0600 Gross per 24 hour  Intake 791.96 ml  Output 376 ml  Net 415.96 ml   Filed Weights   03/23/20 0252 03/24/20 0151 03/25/20 0258  Weight: 65.8 kg 66.3 kg 68.2 kg    Examination: Calm, sitting up in bed Decreased breath sounds at bases no wheezing Regular S1-S2 no gallops Soft benign positive bowel sounds No edema Alert oriented, grossly intact Mood and affect appropriate in current setting    Data Reviewed: I have personally reviewed following labs and imaging studies  CBC: Recent Labs  Lab 03/21/20 1252 03/22/20 0301 03/23/20 0404 03/25/20 0521  WBC 6.6 5.0 5.3 6.5  NEUTROABS 4.4  --   --   --   HGB 12.4 11.8* 12.1 11.6*  HCT 39.7 36.2 37.2 35.8*  MCV 91.9 91.4 90.7 91.6  PLT 223 199 203 166   Basic Metabolic Panel: Recent Labs  Lab 03/21/20 1252 03/22/20 0301 03/23/20 0404 03/24/20 0411 03/25/20 0521  NA 137 137 135 132* 129*  K 5.1 5.2* 4.8 5.2* 4.9  CL 105 104 99 99 94*  CO2 23 26 27 25 26   GLUCOSE 172* 138* 106* 118* 152*  BUN 64* 69* 68* 71* 75*  CREATININE 1.93* 2.16* 2.11* 2.34* 2.44*  CALCIUM 9.6 9.3 9.3 9.1 8.7*   GFR: Estimated Creatinine Clearance: 13.6 mL/min (A) (by C-G formula based on SCr of 2.44 mg/dL (H)). Liver Function Tests: Recent Labs  Lab 03/21/20 1252  AST 32  ALT 19  ALKPHOS 60  BILITOT 1.7*  PROT 6.8  ALBUMIN 3.4*   No results for input(s): LIPASE, AMYLASE in the last 168 hours. No results for input(s): AMMONIA in the last 168 hours. Coagulation Profile: Recent Labs  Lab 03/21/20 1252 03/22/20 0301 03/23/20 0404 03/24/20 0411 03/25/20 0521  INR 2.3* 2.5* 2.4* 2.8* 2.7*   Cardiac Enzymes: No results for input(s): CKTOTAL, CKMB, CKMBINDEX, TROPONINI in the last 168 hours. BNP (last 3 results) No results for input(s): PROBNP in the last 8760 hours. HbA1C: No results for input(s): HGBA1C in the last 72 hours. CBG: No results for input(s): GLUCAP in the last 168 hours. Lipid Profile: No  results for input(s): CHOL, HDL, LDLCALC, TRIG, CHOLHDL, LDLDIRECT in the last 72 hours. Thyroid Function Tests: No results for input(s): TSH, T4TOTAL, FREET4, T3FREE, THYROIDAB in the last 72 hours. Anemia Panel: No results for input(s): VITAMINB12, FOLATE, FERRITIN, TIBC, IRON, RETICCTPCT in the last 72 hours. Sepsis Labs: Recent Labs  Lab 03/21/20 1427  LATICACIDVEN 0.9    Recent Results (from the past 240 hour(s))  Resp Panel by RT-PCR (Flu A&B, Covid) Nasopharyngeal Swab     Status: None   Collection Time: 03/21/20 12:54 PM   Specimen: Nasopharyngeal Swab; Nasopharyngeal(NP) swabs in vial transport medium  Result Value Ref Range Status   SARS Coronavirus 2 by RT PCR NEGATIVE NEGATIVE Final    Comment: (NOTE) SARS-CoV-2 target nucleic acids are NOT DETECTED.  The SARS-CoV-2 RNA is generally detectable in upper respiratory specimens during the acute phase of infection. The lowest concentration of SARS-CoV-2 viral copies this assay can detect is 138 copies/mL. A negative result does not preclude SARS-Cov-2 infection and should not be used as the sole basis for treatment or other patient management decisions. A negative result may occur with  improper specimen collection/handling,  submission of specimen other than nasopharyngeal swab, presence of viral mutation(s) within the areas targeted by this assay, and inadequate number of viral copies(<138 copies/mL). A negative result must be combined with clinical observations, patient history, and epidemiological information. The expected result is Negative.  Fact Sheet for Patients:  EntrepreneurPulse.com.au  Fact Sheet for Healthcare Providers:  IncredibleEmployment.be  This test is no t yet approved or cleared by the [place] FDA and  has been authorized for detection and/or diagnosis of SARS-CoV-2 by FDA under an Emergency Use Authorization (EUA). This EUA will remain  in effect  (meaning this test can be used) for the duration of the COVID-19 declaration under Section 564(b)(1) of the Act, 21 U.S.C.section 360bbb-3(b)(1), unless the authorization is terminated  or revoked sooner.       Influenza A by PCR NEGATIVE NEGATIVE Final   Influenza B by PCR NEGATIVE NEGATIVE Final    Comment: (NOTE) The Xpert Xpress SARS-CoV-2/FLU/RSV plus assay is intended as an aid in the diagnosis of influenza from Nasopharyngeal swab specimens and should not be used as a sole basis for treatment. Nasal washings and aspirates are unacceptable for Xpert Xpress SARS-CoV-2/FLU/RSV testing.  Fact Sheet for Patients: EntrepreneurPulse.com.au  Fact Sheet for Healthcare Providers: IncredibleEmployment.be  This test is not yet approved or cleared by the [place] FDA and has been authorized for detection and/or diagnosis of SARS-CoV-2 by FDA under an Emergency Use Authorization (EUA). This EUA will remain in effect (meaning this test can be used) for the duration of the COVID-19 declaration under Section 564(b)(1) of the Act, 21 U.S.C. section 360bbb-3(b)(1), unless the authorization is terminated or revoked.  Performed at [facility], [place] [address], [place], [place] [address]   Culture, blood (routine x 2)     Status: None (Preliminary result)   Collection Time: 03/21/20  2:27 PM   Specimen: BLOOD RIGHT ARM  Result Value Ref Range Status   Specimen Description BLOOD RIGHT ARM  Final   Special Requests   Final    BOTTLES DRAWN AEROBIC AND ANAEROBIC Blood Culture adequate volume   Culture   Final    NO GROWTH 3 DAYS Performed at [facility], [place] [address], [place], [place] [address]    Report Status PENDING  Incomplete  Culture, blood (Routine X 2) w Reflex to ID Panel     Status: None (Preliminary result)   Collection Time: 03/22/20  3:01 AM   Specimen: BLOOD  Result Value Ref Range Status   Specimen Description BLOOD  RIGHT ANTECUBITAL  Final   Special Requests   Final    BOTTLES DRAWN AEROBIC ONLY Blood Culture adequate volume   Culture   Final    NO GROWTH 2 DAYS Performed at [facility], [place] [address], [place], [place] [address]    Report Status PENDING  Incomplete  SARS CORONAVIRUS 2 (TAT 6-24 HRS) Nasopharyngeal Nasopharyngeal Swab     Status: None   Collection Time: 03/24/20  2:09 PM   Specimen: Nasopharyngeal Swab  Result Value Ref Range Status   SARS Coronavirus 2 NEGATIVE NEGATIVE Final    Comment: (NOTE) SARS-CoV-2 target nucleic acids are NOT DETECTED.  The SARS-CoV-2 RNA is generally detectable in upper and lower respiratory specimens during the acute phase of infection. Negative results do not preclude SARS-CoV-2 infection, do not rule out co-infections with other pathogens, and should not be used as the sole basis for treatment or other patient management decisions. Negative results must  be combined with clinical observations, patient history, and epidemiological information. The expected result is Negative.  Fact Sheet for Patients: SugarRoll.be  Fact Sheet for Healthcare Providers: https://www.woods-mathews.com/  This test is not yet approved or cleared by the [place] FDA and  has been authorized for detection and/or diagnosis of SARS-CoV-2 by FDA under an Emergency Use Authorization (EUA). This EUA will remain  in effect (meaning this test can be used) for the duration of the COVID-19 declaration under Se ction 564(b)(1) of the Act, 21 U.S.C. section 360bbb-3(b)(1), unless the authorization is terminated or revoked sooner.  Performed at [facility], [place] [address], [place], [place] [address]          Radiology Studies: DG Swallowing Func-Speech Pathology  Result Date: 03/23/2020 Objective Swallowing Evaluation: Type of Study: MBS-Modified Barium Swallow Study  Patient Details Name: Deanna [name] MRN:  [id number] Date of Birth: [birth year] Today's Date: 03/23/2020 Time: SLP Start Time (ACUTE ONLY): 5701 -SLP Stop Time (ACUTE ONLY): 1444 SLP Time Calculation (min) (ACUTE ONLY): 16 min Past Medical History: Past Medical History: Diagnosis Date . Anxiety  . Arthritis  . Atrial fibrillation ([place])  . Barrett's esophagus  . Breast cancer ([place])  . Colon polyp 2009  TUBULAR ADENOMA . Diverticulosis of colon (without mention of hemorrhage) 2009 . GERD (gastroesophageal reflux disease)  . Hyperlipidemia  . Hypertension  . Status post dilation of esophageal narrowing  . TRICUSPID REGURGITATION 10/23/2006 Past Surgical History: Past Surgical History: Procedure Laterality Date . ABDOMINAL HYSTERECTOMY   . bcc-face   . CATARACT EXTRACTION Bilateral  . MASTECTOMY Bilateral  HPI: Patient is a 87 y/o female with a working diagnosis of acute diastolic heart failure exacerbation, complicated with acute pneumonitis and acute hypoxemic respiratory failure. CXR 3/14: Left lower lobe consolidation medially, likely due to pneumonia. CT Chest 3/14: large hiatal hernia with the entire stomach up in the chest along with part of the transverse colon; No acute pulmonary findings or worrisome pulmonary lesions. PMH includes GERD, Barrett's esophagus, HTN, breast ca, A-fib, tricuspid regurgitation.  Subjective: Pt was pleasant and agreeable Assessment / Plan / Recommendation CHL IP CLINICAL IMPRESSIONS 03/23/2020 Clinical Impression Pt presents with an oropharyngeal swallow that is [facility] in which she demonstrated appropriate airway protection of all consistencies. Her oral phase was characterized by multiple pumps to propel the boluses back with solid consistencies; but was overall functional and timely with no residue noted. Instances of penetration (PAS 2) were noted with larger consecutive sips of thin liquids; however, the pentrates cleared during her swallow. Her swallow initation for thin liquids occured at the pyiform sinuses but it was still  timely and efficient. Provided education and a handout on esophageal precautions. MD may want to consider consultation with GI if her esophageal symptoms continue to persist (belching was noted during her assessment this morning as well as after her MBS). Given these results, recommend a regular diet and thin liquids - SLP will sign off at this time.  SLP Visit Diagnosis Dysphagia, unspecified (R13.10) Attention and concentration deficit following -- Frontal lobe and executive function deficit following -- Impact on safety and function Mild aspiration risk   CHL IP TREATMENT RECOMMENDATION 03/23/2020 Treatment Recommendations No treatment recommended at this time   Prognosis 03/23/2020 Prognosis for Safe Diet Advancement Good Barriers to Reach Goals -- Barriers/Prognosis Comment -- CHL IP DIET RECOMMENDATION 03/23/2020 SLP Diet Recommendations Regular solids;Thin liquid Liquid Administration via Cup;Straw Medication Administration Whole meds with liquid Compensations Slow rate;Small sips/bites;Follow solids  with liquid Postural Changes Remain semi-upright after after feeds/meals (Comment);Seated upright at 90 degrees   CHL IP OTHER RECOMMENDATIONS 03/23/2020 Recommended Consults Consider GI evaluation Oral Care Recommendations Oral care BID Other Recommendations --   CHL IP FOLLOW UP RECOMMENDATIONS 03/23/2020 Follow up Recommendations None   No flowsheet data found.     CHL IP ORAL PHASE 03/23/2020 Oral Phase WFL Oral - Pudding Teaspoon -- Oral - Pudding Cup -- Oral - Honey Teaspoon -- Oral - Honey Cup -- Oral - Nectar Teaspoon -- Oral - Nectar Cup -- Oral - Nectar Straw -- Oral - Thin Teaspoon -- Oral - Thin Cup -- Oral - Thin Straw -- Oral - Puree -- Oral - Mech Soft -- Oral - Regular -- Oral - Multi-Consistency -- Oral - Pill -- Oral Phase - Comment --  CHL IP PHARYNGEAL PHASE 03/23/2020 Pharyngeal Phase Impaired Pharyngeal- Pudding Teaspoon -- Pharyngeal -- Pharyngeal- Pudding Cup -- Pharyngeal -- Pharyngeal- Honey  Teaspoon -- Pharyngeal -- Pharyngeal- Honey Cup -- Pharyngeal -- Pharyngeal- Nectar Teaspoon -- Pharyngeal -- Pharyngeal- Nectar Cup -- Pharyngeal -- Pharyngeal- Nectar Straw -- Pharyngeal -- Pharyngeal- Thin Teaspoon -- Pharyngeal -- Pharyngeal- Thin Cup WFL Pharyngeal -- Pharyngeal- Thin Straw Penetration/Aspiration before swallow Pharyngeal Material enters airway, remains ABOVE vocal cords then ejected out Pharyngeal- Puree WFL Pharyngeal -- Pharyngeal- Mechanical Soft WFL Pharyngeal -- Pharyngeal- Regular -- Pharyngeal -- Pharyngeal- Multi-consistency -- Pharyngeal -- Pharyngeal- Pill WFL Pharyngeal -- Pharyngeal Comment --  CHL IP CERVICAL ESOPHAGEAL PHASE 03/23/2020 Cervical Esophageal Phase WFL Pudding Teaspoon -- Pudding Cup -- Honey Teaspoon -- Honey Cup -- Nectar Teaspoon -- Nectar Cup -- Nectar Straw -- Thin Teaspoon -- Thin Cup -- Thin Straw -- Puree -- Mechanical Soft -- Regular -- Multi-consistency -- Pill -- Cervical Esophageal Comment -- Note populated for Lebron Conners, Student SLP Osie Bond., M.A. [facility] Pager [phone] Office [phone] 03/23/2020, 4:00 PM                   Scheduled Meds: . allopurinol  100 mg Oral Daily  . fenofibrate  160 mg Oral Daily  . furosemide  20 mg Oral Daily  . mouth rinse  15 mL Mouth Rinse BID  . multivitamin with minerals  1 tablet Oral Daily  . pantoprazole  40 mg Oral BID AC  . pravastatin  40 mg Oral Daily  . sodium chloride flush  3 mL Intravenous Q12H  . sucralfate  1 g Oral TID WC & HS  . Warfarin - Pharmacist Dosing Inpatient   Does not apply q1600   Continuous Infusions: . sodium chloride      Assessment & Plan:   Principal Problem:   Acute respiratory failure with hypoxia (HCC) Active Problems:   Hyperlipidemia   Gout   Essential hypertension   ATRIAL FIBRILLATION   Barrett's esophagus   Acute CHF (congestive heart failure) ([place])   Pneumonitis   Encounter for hospice care discussion    Palliative care encounter   1. Acute hypoxemic respiratory failure, combination of cardiogenic pulmonary edema (decomepensated chronic diastolic heart failure) and pneumonitis due to aspiration.  Patient with large hiatal hernia and risk for aspiration and aspiration pneumonitis. Her volume status has improved, documented urine output over last 24 hrs is 800, and blood pressure has been low with systolic 90 to 580 mmHg.  Continue on supplemental 02, currently on 4 l/min per [place] with oxygen saturation at  99%.  3/17-on RA  88-91% 3/18-clinically improved. We will  resume p.o. Lasix DC IV fluids Aspiration precautions-PPI I-S Plan for discharge with hospice care   2.hyponatremia-  Etiology unclear. From diuresis v.s. getting fluid overnight Its a fine line  Will d/c ivf Resume po lasix Monitor  3. Paroxysmal atrial fibrillation.  Had junctional bradycardia -secondary to digoxin  This was discontinued  Warfarin was continued      3. HTN. BP normotensive  Continue with home meds      4. AKI on CKD stage 3a/ hyperkalemia. Renal function with worsening cr up to 2,16 with K at 5,2 and serum bicarbonate at 26. 3/17- creatinine up mildly today 3/18-creatinine up mildly 2.44 We will DC IV fluids  Resume Lasix  Discussed with daughter that since patient is palliative ongoing home with hospice that we will not pursue consultation with nephrology and they are agreeable to this      5. Dyslipidemia.  Continue pravastatin and fenofibrate     6. Gout.  No acute flares Continue allopurinol   7. Anxiety.  As needed alprazolam     DVT prophylaxis: coumadin Code Status:DNR Family Communication: daughter at bedside  Status is: Inpatient  Remains inpatient appropriate because:Inpatient level of care appropriate due to severity of illness   Dispo: The patient is from: Home              Anticipated d/c is to: assisted living with hospice              Patient  currently is not medically stable to d/c.   Difficult to place patient No anticipitated d/c in am if na level stablize. Also placement pending.            LOS: 4 days   Time spent: 45 min with >50% on coc    Nolberto Hanlon, MD [facility] Pager [phone]  If 7PM-7AM, please contact night-coverage 03/25/2020, 8:13 AM

## 2020-03-25 NOTE — Progress Notes (Signed)
ANTICOAGULATION CONSULT NOTE - Follow Up Consult  Pharmacy Consult for Warfarin Indication: atrial fibrillation  Allergies  Allergen Reactions  . Aspirin Other (See Comments)    REACTION: nervousness---tolerates ibuprofen  . Atorvastatin Other (See Comments)    myalgia    Patient Measurements: Height: 5\' 2"  (157.5 cm) Weight: 68.2 kg (150 lb 5.7 oz) IBW/kg (Calculated) : 50.1  Vital Signs: BP: 113/48 (03/18 0351) Pulse Rate: 45 (03/18 0351)  Labs: Recent Labs    03/23/20 0404 03/24/20 0411 03/25/20 0521  HGB 12.1  --  11.6*  HCT 37.2  --  35.8*  PLT 203  --  173  LABPROT 25.6* 28.4* 28.0*  INR 2.4* 2.8* 2.7*  CREATININE 2.11* 2.34* 2.44*    Estimated Creatinine Clearance: 13.6 mL/min (A) (by C-G formula based on SCr of 2.44 mg/dL (H)).  Assessment: Pt is a 18 YOF with history of Afib on warfarin PTA presented 03/21/20 with acute CHF. Pharmacy consulted for Warfarin dosing.  Per patient, INR has recently been high and regimen has been changing frequently. Pt reports latest regimen was 1 alternating with 2 mg, and 1 mg taken PTA on 03/20/20.   INR is therapeutic at 2.7. CBC stable. Oral intake documented at 85% when charted yesterday.   Goal of Therapy:  INR 2-3 Monitor platelets by anticoagulation protocol: Yes   Plan:  Warfarin 2 mg tonight as per PTA regimen Daily PT/INR for now  Antonietta Jewel, PharmD, [place] Pharmacist  Phone: [phone] 03/25/2020 8:07 AM  Please check AMION for all [place] phone numbers After 10:00 PM, call [place] [phone]

## 2020-03-25 NOTE — Progress Notes (Signed)
OT Cancellation Note  Patient Details Name: Deanna [name] MRN: [id number] DOB: 08/20/1926   Cancelled Treatment:    Reason Eval/Treat Not Completed: Patient declined, increased anxiety and discomfort noted.  OT to continue efforts as appropriate.  Richard D Popella 03/25/2020, 9:16 AM

## 2020-03-25 NOTE — Progress Notes (Signed)
   03/25/20 1112  Mobility  Activity Refused mobility (Declined due to SOB. Paitent doesn't appear to be in distress at this time. Will check back as time permits)

## 2020-03-25 NOTE — Progress Notes (Addendum)
Palliative Medicine RN Note:  I rec'd a message from PMT NP Kathie Rhodes, who is not working in [employer] today; Dr Kurtis Bushman reached out to her to have hospice see this family. I sent a secure chat to Dr Kurtis Bushman and [facility] to let her know that [facility] will process the hospice order asap (written yesterday, but need placement set up before referral can be done).  Marjie Skiff Geniece Akers, RN, BSN, [facility] Palliative Medicine Team 03/25/2020 12:36 PM Office [phone]   Daughter Dorris Fetch [phone]) called the PMT office. She went to [place], and she has decided NOT to use hospice. [facility] told her that the patient needs home health with [facility] to avoid being moved twice. I do not understand this process enough to answer more questions, so I sent a follow up secure chat to the group listed above. Ms Luan Pulling also has paperwork that she needs completed for home health.  Marjie Skiff Sully Dyment, RN, BSN, [facility] Palliative Medicine Team 03/25/2020 12:59 PM Office [phone]

## 2020-03-25 NOTE — TOC Progression Note (Signed)
Transition of Care [facility]) - Progression Note    Patient Details  Name: Deanna [name] MRN: [id number] Date of Birth: [birth year]  Transition of Care [facility]) CM/SW Contact  Deanna Crews, RN Phone Number: [phone] 03/25/2020, 4:36 PM  Clinical Narrative:     Spoke with patient's daughter, Deanna [name], at the bedside to discuss transition planning. Deanna [name] has made arrangements with Deanna [name] for patient to transition to ALF from [place] independent living. [facility] to provide home health therapy. Discussed the difference with home health and hospice. Daughter would like to see if patient benefits from therapy before determining whether to choose hospice services. Discussed that Deanna [name] can assist with hospice referral. Deanna [name] advised that [facility] requires a FL2 and CXR for TB clearance. Patient is vaccinated and boosted against covid. Spoke with CSW to assist with FL2 - thank you. TOC following for transition needs.   Expected Discharge Plan: [place] Barriers to Discharge: Continued Medical Work up  Expected Discharge Plan and Services Expected Discharge Plan: [place] In-house Referral: Clinical Social Work Discharge Planning Services: CM Consult Post Acute Care Choice: [place] arrangements for the past 2 months: [place]                 DME Arranged: N/A DME Agency: NA       HH Arranged: NA [place] Agency: NA         Social Determinants of Health (SDOH) Interventions    Readmission Risk Interventions No flowsheet data found.

## 2020-03-25 NOTE — NC FL2 (Signed)
[place] MEDICAID FL2 LEVEL OF CARE SCREENING TOOL     IDENTIFICATION  Patient Name: Deanna [name] Birthdate: 08/02/1926 Sex: female Admission Date (Current Location): 03/21/2020  [facility] and [place] Number:  Herbalist and Address:  [facility] [place], [place] [address], [place], [place] [address]      Provider Number: [id number]  Attending Physician Name and Address:  Nolberto Hanlon, MD  Relative Name and Phone Number:  Deanna [name] (Daughter)   [phone] [facility] Phone)    Current Level of Care: Hospital Recommended Level of Care: [name]) Prior Approval Number:    Date Approved/Denied:   PASRR Number:    Discharge Plan: Other (Comment) [name])    Current Diagnoses: Patient Active Problem List   Diagnosis Date Noted  . Encounter for hospice care discussion   . Palliative care encounter   . Acute respiratory failure with hypoxia ([place]) 03/22/2020  . Pneumonitis 03/22/2020  . Acute CHF (congestive heart failure) ([place]) 03/21/2020  . CHF (congestive heart failure) ([place]) 03/21/2020  . Hypoxia   . Hypertensive urgency 09/21/2015  . Chest pain 09/20/2015  . Hypercalcemia 05/09/2015  . Encounter for therapeutic drug monitoring 02/02/2013  . Personal history of colonic polyps 04/07/2012  . Barrett's esophagus 04/07/2012  . Osteoarthritis 02/01/2012  . URI (upper respiratory infection) 01/16/2012  . Gout 12/24/2007  . TRICUSPID REGURGITATION 10/23/2006  . ATRIAL FIBRILLATION 10/15/2006  . HYPERGLYCEMIA 10/15/2006  . Hyperlipidemia 09/23/2006  . ANXIETY 09/23/2006  . Essential hypertension 09/23/2006  . GERD 09/23/2006    Orientation RESPIRATION BLADDER Height & Weight     Self,Time,Situation,Place  O2 (nasal cannula 2 liters) Continent Weight: 150 lb 5.7 oz (68.2 kg) Height:  5\' 2"  (157.5 cm)  BEHAVIORAL SYMPTOMS/MOOD NEUROLOGICAL BOWEL NUTRITION STATUS      Continent Diet (see  d/c summary)  AMBULATORY STATUS COMMUNICATION OF NEEDS Skin   Extensive Assist Verbally Normal                       Personal Care Assistance Level of Assistance  Bathing,Feeding,Dressing Bathing Assistance: Limited assistance Feeding assistance: Independent Dressing Assistance: Limited assistance     Functional Limitations Info  Hearing,Sight,Speech Sight Info: Adequate Hearing Info: Impaired Speech Info: Adequate    SPECIAL CARE FACTORS FREQUENCY  OT (By licensed OT),PT (By licensed PT)     PT Frequency: 3x min weekly OT Frequency: 3x min weekly            Contractures Contractures Info: Not present    Additional Factors Info  Code Status,Allergies Code Status Info: DNR Allergies Info: Atorvastatin, Aspirin           Current Medications (03/25/2020):  This is the current hospital active medication list Current Facility-Administered Medications  Medication Dose Route Frequency Provider Last Rate Last Admin  . 0.9 %  sodium chloride infusion  250 mL Intravenous PRN Wynetta Fines T, MD      . acetaminophen (TYLENOL) tablet 650 mg  650 mg Oral Q4H PRN Lequita Halt, MD   650 mg at 03/24/20 1128  . allopurinol (ZYLOPRIM) tablet 100 mg  100 mg Oral Daily [place] Fines T, MD   100 mg at 03/25/20 0943  . ALPRAZolam Duanne Moron) tablet 0.5 mg  0.5 mg Oral TID PRN Arrien, Jimmy Picket, MD   0.5 mg at 03/25/20 0945  . diphenhydrAMINE (BENADRYL) capsule 25 mg  25 mg Oral QHS PRN Zierle-Ghosh, Asia B, DO  25 mg at 03/23/20 2344  . fenofibrate tablet 160 mg  160 mg Oral Daily [place]etta Fines T, MD   160 mg at 03/25/20 0943  . furosemide (LASIX) tablet 20 mg  20 mg Oral Daily Nolberto Hanlon, MD   20 mg at 03/25/20 0942  . MEDLINE mouth rinse  15 mL Mouth Rinse BID [place] Fines T, MD   15 mL at 03/24/20 0949  . multivitamin with minerals tablet 1 tablet  1 tablet Oral Daily Nolberto Hanlon, MD   1 tablet at 03/25/20 0943  . ondansetron (ZOFRAN) injection 4 mg  4 mg Intravenous Q6H PRN  [place] Fines T, MD   4 mg at 03/23/20 2030  . oxyCODONE (Oxy IR/ROXICODONE) immediate release tablet 2.5 mg  2.5 mg Oral Q4H PRN Dellinger, Marianne L, PA-C   2.5 mg at 03/25/20 0355  . pantoprazole (PROTONIX) EC tablet 40 mg  40 mg Oral BID AC Arrien, Jimmy Picket, MD   40 mg at 03/25/20 1829  . pravastatin (PRAVACHOL) tablet 40 mg  40 mg Oral Daily [place] Fines T, MD   40 mg at 03/25/20 0943  . sodium chloride flush (NS) 0.9 % injection 3 mL  3 mL Intravenous Q12H [place] Fines T, MD   3 mL at 03/25/20 0956  . sodium chloride flush (NS) 0.9 % injection 3 mL  3 mL Intravenous PRN [place] Fines T, MD      . sucralfate (CARAFATE) 1 GM/10ML suspension 1 g  1 g Oral TID WC & HS Arrien, Jimmy Picket, MD   1 g at 03/25/20 1200  . warfarin (COUMADIN) tablet 2 mg  2 mg Oral ONCE-1600 Nolberto Hanlon, MD      . Warfarin - Pharmacist Dosing Inpatient   Does not apply q1600 [facility]   Given at 03/24/20 1639     Discharge Medications: Please see discharge summary for a list of discharge medications.  Relevant Imaging Results:  Relevant Lab Results:   Additional Information SSN [id number] [place]

## 2020-03-26 LAB — BASIC METABOLIC PANEL
Anion gap: 8 (ref 5–15)
BUN: 75 mg/dL — ABNORMAL HIGH (ref 8–23)
CO2: 26 mmol/L (ref 22–32)
Calcium: 9 mg/dL (ref 8.9–10.3)
Chloride: 94 mmol/L — ABNORMAL LOW (ref 98–111)
Creatinine, Ser: 2.27 mg/dL — ABNORMAL HIGH (ref 0.44–1.00)
GFR, Estimated: 20 mL/min — ABNORMAL LOW (ref >60)
Glucose, Bld: 122 mg/dL — ABNORMAL HIGH (ref 70–99)
Potassium: 4.7 mmol/L (ref 3.5–5.1)
Sodium: 128 mmol/L — ABNORMAL LOW (ref 135–145)

## 2020-03-26 LAB — CULTURE, BLOOD (ROUTINE X 2)
Culture: NO GROWTH
Special Requests: ADEQUATE

## 2020-03-26 LAB — PROTIME-INR
INR: 2.7 — ABNORMAL HIGH (ref 0.8–1.2)
Prothrombin Time: 27.4 seconds — ABNORMAL HIGH (ref 11.4–15.2)

## 2020-03-26 MED ORDER — WARFARIN SODIUM 1 MG PO TABS
1.0000 mg | ORAL_TABLET | Freq: Once | ORAL | Status: AC
  Administered 2020-03-26: 1 mg via ORAL
  Filled 2020-03-26: qty 1

## 2020-03-26 NOTE — Progress Notes (Signed)
ANTICOAGULATION CONSULT NOTE - Follow Up Consult  Pharmacy Consult for Warfarin Indication: atrial fibrillation  Allergies  Allergen Reactions  . Aspirin Other (See Comments)    REACTION: nervousness---tolerates ibuprofen  . Atorvastatin Other (See Comments)    myalgia    Patient Measurements: Height: 5\' 2"  (157.5 cm) Weight: 68.6 kg (151 lb 3.2 oz) IBW/kg (Calculated) : 50.1  Vital Signs: Temp: 97.8 F (36.6 C) (03/19 0309) Temp Source: Oral (03/19 0309) BP: 111/50 (03/19 0309) Pulse Rate: 45 (03/19 0309)  Labs: Recent Labs    03/24/20 0411 03/25/20 0521 03/26/20 0251  HGB  --  11.6*  --   HCT  --  35.8*  --   PLT  --  173  --   LABPROT 28.4* 28.0* 27.4*  INR 2.8* 2.7* 2.7*  CREATININE 2.34* 2.44* 2.27*    Estimated Creatinine Clearance: 14.7 mL/min (A) (by C-G formula based on SCr of 2.27 mg/dL (H)).  Assessment: Pt is a 45 YOF with history of Afib on warfarin PTA presented 03/21/20 with acute CHF. Pharmacy consulted for Warfarin dosing.  Per patient, INR has recently been high and regimen has been changing frequently. Pt reports latest regimen was 1 alternating with 2 mg, and 1 mg taken PTA on 03/20/20.   INR remains therapeutic at 2.7. CBC stable. Oral intake documented at 60% when charted yesterday.   Goal of Therapy:  INR 2-3 Monitor platelets by anticoagulation protocol: Yes   Plan:  Warfarin 1 mg tonight as per PTA regimen Daily PT/INR, monitor s/sx bleeding  Mercy Riding, PharmD PGY1 Acute Care Pharmacy Resident Please refer to [facility] for unit-specific pharmacist

## 2020-03-26 NOTE — Progress Notes (Signed)
PROGRESS NOTE    Deanna [name]  KYH:[id number] DOB: 1926/07/26 DOA: 03/21/2020 PCP: Lajean Manes, MD    Brief Narrative:  [name] was admitted to the hospital with a working diagnosis of acute diastolic heart failure exacerbation, complicated with acute pneumonitis and acute hypoxemic respiratory failure.   87 year old female with hypertension, diastolic heart failure, paroxysmal atrial fibrillation, dyslipidemia, osteoarthritis and history of breast cancer status post mastectomy.  Patient reported intermittent dyspnea for about 7 days, associated with orthopnea and cough.  Reported poorly controlled hypertension for the last 2 months.  On her initial physical examination blood pressure 121/49, heart rate 48-53, respiratory rate 20, oxygen saturation 96%.  Positive rales bilaterally, increased work of breathing, heart S1-S2, present, rhythmic, positive systolic murmur at the apex, abdomen soft nontender, positive significant lower extremity edema.   Sodium 137, potassium 5.1, chloride 105, bicarb 23, glucose 172, BUN 64, creatinine 1.93, BNP 150, troponin I 41-45, white count 6.6, hemoglobin 12.4, hematocrit 39.7, platelets 223. SARS COVID-19 negative.  Chest radiograph with cardiomegaly, left lower lobe atelectasis, left lung volume. CT chest with no pericardial effusion, large hiatal hernia with entire stomach up in the chest along with part of the transverse colon.  Adjacent atelectasis to the large hiatal hernia sac.  EKG 52 bpm, normal axis, right bundle branch block, no ST segment changes,  Negative T wave lead II, III and AVf, V4-V3-V4-V5.   3/16-sob little better today. Daughter at bedside.  3/17- daughter at bedside. Pt appears better. palliative care consulted. 3/18-bedside.  Tells me they have decided on going to assisted living with hospice.  They had a discussion with palliative care yesterday 3/19-no issues .  Consultants:  Palliative   Procedures:  1. Left  ventricular ejection fraction, by estimation, is 55 to 60%. The  left ventricle has normal function. The left ventricle has no regional  wall motion abnormalities. Left ventricular diastolic function could not  be evaluated. There is the  interventricular septum is flattened in diastole ('D' shaped left  ventricle), consistent with right ventricular volume overload.  2. Right ventricular systolic function is severely reduced. The right  ventricular size is severely enlarged. There is mildly elevated pulmonary  artery systolic pressure. The estimated right ventricular systolic  pressure is 15.1 mmHg.  3. Left atrial size was severely dilated.  4. Right atrial size was giant.  5. The mitral valve is degenerative. Mild mitral valve regurgitation.  Moderate mitral annular calcification.  6. Severely dilated tricuspid annulus with leaflet malcoaptation and  torrential regurgitation. The tricuspid valve is abnormal. Tricuspid valve  regurgitation wide-open.  7. The aortic valve is grossly normal. Aortic valve regurgitation is  trivial. Mild to moderate aortic valve sclerosis/calcification is present,  without any evidence of aortic stenosis.  8. The inferior vena cava is dilated in size with <50% respiratory  variability, suggesting right atrial pressure of 15 mmHg.   Comparison(s): Prior images unable to be directly viewed, comparison made  by report only. The right ventricular hypertrophy is significantly worse.   Antimicrobials:       Subjective: Daughter at bedside.  Patient sleeping.  No issues reported  Objective: Vitals:   03/25/20 1947 03/26/20 0004 03/26/20 0309 03/26/20 1115  BP: (!) 104/45  (!) 111/50 (!) 125/55  Pulse: (!) 45  (!) 45 (!) 48  Resp: 18  18 18   Temp: 98.2 F (36.8 C)  97.8 F (36.6 C) 97.6 F (36.4 C)  TempSrc: Oral  Oral Oral  SpO2: 91%  91% 90%  Weight:  68.6 kg    Height:        Intake/Output Summary (Last 24 hours) at 03/26/2020  1224 Last data filed at 03/26/2020 0800 Gross per 24 hour  Intake 1043 ml  Output 400 ml  Net 643 ml   Filed Weights   03/24/20 0151 03/25/20 0258 03/26/20 0004  Weight: 66.3 kg 68.2 kg 68.6 kg    Examination: Calm, sleepy CTA no wheeze rales rhonchi's Regular S1-S2 no gallops Soft benign positive bowel sounds No edema Mood and affect appropriate in current setting   Data Reviewed: I have personally reviewed following labs and imaging studies  CBC: Recent Labs  Lab 03/21/20 1252 03/22/20 0301 03/23/20 0404 03/25/20 0521  WBC 6.6 5.0 5.3 6.5  NEUTROABS 4.4  --   --   --   HGB 12.4 11.8* 12.1 11.6*  HCT 39.7 36.2 37.2 35.8*  MCV 91.9 91.4 90.7 91.6  PLT 223 199 203 694   Basic Metabolic Panel: Recent Labs  Lab 03/22/20 0301 03/23/20 0404 03/24/20 0411 03/25/20 0521 03/26/20 0251  NA 137 135 132* 129* 128*  K 5.2* 4.8 5.2* 4.9 4.7  CL 104 99 99 94* 94*  CO2 26 27 25 26 26   GLUCOSE 138* 106* 118* 152* 122*  BUN 69* 68* 71* 75* 75*  CREATININE 2.16* 2.11* 2.34* 2.44* 2.27*  CALCIUM 9.3 9.3 9.1 8.7* 9.0   GFR: Estimated Creatinine Clearance: 14.7 mL/min (A) (by C-G formula based on SCr of 2.27 mg/dL (H)). Liver Function Tests: Recent Labs  Lab 03/21/20 1252  AST 32  ALT 19  ALKPHOS 60  BILITOT 1.7*  PROT 6.8  ALBUMIN 3.4*   No results for input(s): LIPASE, AMYLASE in the last 168 hours. No results for input(s): AMMONIA in the last 168 hours. Coagulation Profile: Recent Labs  Lab 03/22/20 0301 03/23/20 0404 03/24/20 0411 03/25/20 0521 03/26/20 0251  INR 2.5* 2.4* 2.8* 2.7* 2.7*   Cardiac Enzymes: No results for input(s): CKTOTAL, CKMB, CKMBINDEX, TROPONINI in the last 168 hours. BNP (last 3 results) No results for input(s): PROBNP in the last 8760 hours. HbA1C: No results for input(s): HGBA1C in the last 72 hours. CBG: No results for input(s): GLUCAP in the last 168 hours. Lipid Profile: No results for input(s): CHOL, HDL, LDLCALC, TRIG,  CHOLHDL, LDLDIRECT in the last 72 hours. Thyroid Function Tests: No results for input(s): TSH, T4TOTAL, FREET4, T3FREE, THYROIDAB in the last 72 hours. Anemia Panel: No results for input(s): VITAMINB12, FOLATE, FERRITIN, TIBC, IRON, RETICCTPCT in the last 72 hours. Sepsis Labs: Recent Labs  Lab 03/21/20 1427  LATICACIDVEN 0.9    Recent Results (from the past 240 hour(s))  Resp Panel by RT-PCR (Flu A&B, Covid) Nasopharyngeal Swab     Status: None   Collection Time: 03/21/20 12:54 PM   Specimen: Nasopharyngeal Swab; Nasopharyngeal(NP) swabs in vial transport medium  Result Value Ref Range Status   SARS Coronavirus 2 by RT PCR NEGATIVE NEGATIVE Final    Comment: (NOTE) SARS-CoV-2 target nucleic acids are NOT DETECTED.  The SARS-CoV-2 RNA is generally detectable in upper respiratory specimens during the acute phase of infection. The lowest concentration of SARS-CoV-2 viral copies this assay can detect is 138 copies/mL. A negative result does not preclude SARS-Cov-2 infection and should not be used as the sole basis for treatment or other patient management decisions. A negative result may occur with  improper specimen collection/handling, submission of specimen other than nasopharyngeal swab, presence of viral mutation(s) within  the areas targeted by this assay, and inadequate number of viral copies(<138 copies/mL). A negative result must be combined with clinical observations, patient history, and epidemiological information. The expected result is Negative.  Fact Sheet for Patients:  EntrepreneurPulse.com.au  Fact Sheet for Healthcare Providers:  IncredibleEmployment.be  This test is no t yet approved or cleared by the [place] FDA and  has been authorized for detection and/or diagnosis of SARS-CoV-2 by FDA under an Emergency Use Authorization (EUA). This EUA will remain  in effect (meaning this test can be used) for the duration of  the COVID-19 declaration under Section 564(b)(1) of the Act, 21 U.S.C.section 360bbb-3(b)(1), unless the authorization is terminated  or revoked sooner.       Influenza A by PCR NEGATIVE NEGATIVE Final   Influenza B by PCR NEGATIVE NEGATIVE Final    Comment: (NOTE) The Xpert Xpress SARS-CoV-2/FLU/RSV plus assay is intended as an aid in the diagnosis of influenza from Nasopharyngeal swab specimens and should not be used as a sole basis for treatment. Nasal washings and aspirates are unacceptable for Xpert Xpress SARS-CoV-2/FLU/RSV testing.  Fact Sheet for Patients: EntrepreneurPulse.com.au  Fact Sheet for Healthcare Providers: IncredibleEmployment.be  This test is not yet approved or cleared by the [place] FDA and has been authorized for detection and/or diagnosis of SARS-CoV-2 by FDA under an Emergency Use Authorization (EUA). This EUA will remain in effect (meaning this test can be used) for the duration of the COVID-19 declaration under Section 564(b)(1) of the Act, 21 U.S.C. section 360bbb-3(b)(1), unless the authorization is terminated or revoked.  Performed at [facility], [place] [address], [place], [place] [address]   Culture, blood (routine x 2)     Status: None (Preliminary result)   Collection Time: 03/21/20  2:27 PM   Specimen: BLOOD RIGHT ARM  Result Value Ref Range Status   Specimen Description BLOOD RIGHT ARM  Final   Special Requests   Final    BOTTLES DRAWN AEROBIC AND ANAEROBIC Blood Culture adequate volume   Culture   Final    NO GROWTH 4 DAYS Performed at [facility], [place] [address], [place], [place] [address]    Report Status PENDING  Incomplete  Culture, blood (Routine X 2) w Reflex to ID Panel     Status: None (Preliminary result)   Collection Time: 03/22/20  3:01 AM   Specimen: BLOOD  Result Value Ref Range Status   Specimen Description BLOOD RIGHT ANTECUBITAL  Final   Special Requests   Final     BOTTLES DRAWN AEROBIC ONLY Blood Culture adequate volume   Culture   Final    NO GROWTH 3 DAYS Performed at [facility], [place] [address], [place], [place] [address]    Report Status PENDING  Incomplete  SARS CORONAVIRUS 2 (TAT 6-24 HRS) Nasopharyngeal Nasopharyngeal Swab     Status: None   Collection Time: 03/24/20  2:09 PM   Specimen: Nasopharyngeal Swab  Result Value Ref Range Status   SARS Coronavirus 2 NEGATIVE NEGATIVE Final    Comment: (NOTE) SARS-CoV-2 target nucleic acids are NOT DETECTED.  The SARS-CoV-2 RNA is generally detectable in upper and lower respiratory specimens during the acute phase of infection. Negative results do not preclude SARS-CoV-2 infection, do not rule out co-infections with other pathogens, and should not be used as the sole basis for treatment or other patient management decisions. Negative results must be combined with clinical observations, patient history, and epidemiological information. The expected  result is Negative.  Fact Sheet for Patients: SugarRoll.be  Fact Sheet for Healthcare Providers: https://www.woods-mathews.com/  This test is not yet approved or cleared by the [place] FDA and  has been authorized for detection and/or diagnosis of SARS-CoV-2 by FDA under an Emergency Use Authorization (EUA). This EUA will remain  in effect (meaning this test can be used) for the duration of the COVID-19 declaration under Se ction 564(b)(1) of the Act, 21 U.S.C. section 360bbb-3(b)(1), unless the authorization is terminated or revoked sooner.  Performed at [facility], [place] [address], [place], [place] [address]          Radiology Studies: No results found.      Scheduled Meds: . allopurinol  100 mg Oral Daily  . fenofibrate  160 mg Oral Daily  . furosemide  20 mg Oral Daily  . mouth rinse  15 mL Mouth Rinse BID  . multivitamin with minerals  1 tablet Oral Daily  .  pantoprazole  40 mg Oral BID AC  . pravastatin  40 mg Oral Daily  . sodium chloride flush  3 mL Intravenous Q12H  . sucralfate  1 g Oral TID WC & HS  . warfarin  1 mg Oral ONCE-1600  . Warfarin - Pharmacist Dosing Inpatient   Does not apply q1600   Continuous Infusions: . sodium chloride      Assessment & Plan:   Principal Problem:   Acute respiratory failure with hypoxia (HCC) Active Problems:   Hyperlipidemia   Gout   Essential hypertension   ATRIAL FIBRILLATION   Barrett's esophagus   Acute CHF (congestive heart failure) ([place])   Pneumonitis   Encounter for hospice care discussion   Palliative care encounter   1. Acute hypoxemic respiratory failure, combination of cardiogenic pulmonary edema (decomepensated chronic diastolic heart failure) and pneumonitis due to aspiration.  Patient with large hiatal hernia and risk for aspiration and aspiration pneumonitis. Her volume status has improved, documented urine output over last 24 hrs is 800, and blood pressure has been low with systolic 90 to 778 mmHg.  Continue on supplemental 02, currently on 4 l/min per [place] with oxygen saturation at  99%.  3/17-on RA  88-91% 3/19-no worsening, stable Continue Lasix Aspiration precaution Plan for discharge with hospice care     2.hyponatremia-  Etiology unclear. From diuresis v.s. getting fluid overnight Its a fine line  Will d/c ivf 3/19 -sodium down to 128  Continue Lasix  She is on palliative hospice service    3. Paroxysmal atrial fibrillation.  Had junctional bradycardia -secondary to digoxin  3/19 Digoxin was d/c'd Continue on Coumadin     4. HTN. Normotensive  Continue monitoring       4. AKI on CKD stage 3a/ hyperkalemia. Renal function with worsening cr up to 2,16 with K at 5,2 and serum bicarbonate at 26. 3/17- creatinine up mildly today 3/18-creatinine up mildly 2.44 3/19 creatinine improving to 2.27  Continue with lasix      5. Dyslipidemia.   Continue pravastatin and fenofibrate     6. Gout.  No acute flares Continue allopurinol   7. Anxiety.  As needed alprazolam     DVT prophylaxis: coumadin Code Status:DNR Family Communication: daughter at bedside  Status is: Inpatient  Remains inpatient appropriate because:unsafe d/c  Dispo: The patient is from: Home              Anticipated d/c is to: assisted living with hospice  Patient currently medically stable.   Difficult to place patient No Placement to assisted living with hospice pending           LOS: 5 days   Time spent: 35 min with >50% on coc    Nolberto Hanlon, MD [facility] Pager [phone]  If 7PM-7AM, please contact night-coverage 03/26/2020, 12:24 PM

## 2020-03-27 LAB — SODIUM: Sodium: 128 mmol/L — ABNORMAL LOW (ref 135–145)

## 2020-03-27 LAB — PROTIME-INR
INR: 2.8 — ABNORMAL HIGH (ref 0.8–1.2)
Prothrombin Time: 28.3 seconds — ABNORMAL HIGH (ref 11.4–15.2)

## 2020-03-27 MED ORDER — LIDOCAINE 5 % EX PTCH
1.0000 | MEDICATED_PATCH | CUTANEOUS | Status: DC
  Administered 2020-03-27 – 2020-03-28 (×2): 1 via TRANSDERMAL
  Filled 2020-03-27 (×2): qty 1

## 2020-03-27 MED ORDER — WARFARIN SODIUM 1 MG PO TABS
1.0000 mg | ORAL_TABLET | Freq: Once | ORAL | Status: DC
  Filled 2020-03-27: qty 1

## 2020-03-27 NOTE — Progress Notes (Signed)
ANTICOAGULATION CONSULT NOTE - Follow Up Consult  Pharmacy Consult for Warfarin Indication: atrial fibrillation  Allergies  Allergen Reactions  . Aspirin Other (See Comments)    REACTION: nervousness---tolerates ibuprofen  . Atorvastatin Other (See Comments)    myalgia    Patient Measurements: Height: 5\' 2"  (157.5 cm) Weight: 68.3 kg (150 lb 8 oz) IBW/kg (Calculated) : 50.1  Vital Signs: Temp: 97.7 F (36.5 C) (03/20 0651) Temp Source: Oral (03/20 0651) BP: 141/49 (03/20 0651) Pulse Rate: 48 (03/20 0651)  Labs: Recent Labs    03/25/20 0521 03/26/20 0251 03/27/20 0224  HGB 11.6*  --   --   HCT 35.8*  --   --   PLT 173  --   --   LABPROT 28.0* 27.4* 28.3*  INR 2.7* 2.7* 2.8*  CREATININE 2.44* 2.27*  --     Estimated Creatinine Clearance: 14.6 mL/min (A) (by C-G formula based on SCr of 2.27 mg/dL (H)).  Assessment: Pt is a 87 YOF with history of Afib on warfarin PTA presented 03/21/20 with acute CHF. Pharmacy consulted for Warfarin dosing.  Per patient, INR has recently been high and regimen has been changing frequently. Pt reports latest regimen was 1 alternating with 2 mg, and 1 mg taken PTA on 03/20/20.   INR therapeutic, up slightly from yesterday at 2.8. Next CBC 3/21. Oral intake documented at 50% when charted yesterday. Will reduce home regimen slightly to prevent supratherapeutic INR.   Goal of Therapy:  INR 2-3 Monitor platelets by anticoagulation protocol: Yes   Plan:  Warfarin 1 mg tonight Daily PT/INR, monitor s/sx bleeding  Mercy Riding, PharmD PGY1 Acute Care Pharmacy Resident Please refer to [facility] for unit-specific pharmacist

## 2020-03-27 NOTE — Progress Notes (Signed)
PROGRESS NOTE    Deanna [name]  NWG:[id number] DOB: 01/12/1926 DOA: 03/21/2020 PCP: Lajean Manes, MD    Brief Narrative:  Deanna [name] was admitted to [facility] hospital with a working diagnosis of acute diastolic heart failure exacerbation, complicated with acute pneumonitis and acute hypoxemic respiratory failure.   87 year old female with hypertension, diastolic heart failure, paroxysmal atrial fibrillation, dyslipidemia, osteoarthritis and history of breast cancer status post mastectomy.  [name] reported intermittent dyspnea for about 7 days, associated with orthopnea and cough.  Reported poorly controlled hypertension for Deanna last 2 months.  On her initial physical examination blood pressure 121/49, heart rate 48-53, respiratory rate 20, oxygen saturation 96%.  Positive rales bilaterally, increased work of breathing, heart S1-S2, present, rhythmic, positive systolic murmur at Deanna apex, abdomen soft nontender, positive significant lower extremity edema.   Sodium 137, potassium 5.1, chloride 105, bicarb 23, glucose 172, BUN 64, creatinine 1.93, BNP 150, troponin I 41-45, white count 6.6, hemoglobin 12.4, hematocrit 39.7, platelets 223. SARS COVID-19 negative.  Chest radiograph with cardiomegaly, left lower lobe atelectasis, left lung volume. CT chest with no pericardial effusion, large hiatal hernia with entire stomach up in Deanna chest along with part of Deanna transverse colon.  Adjacent atelectasis to Deanna large hiatal hernia sac.  EKG 52 bpm, normal axis, right bundle branch block, no ST segment changes,  Negative T wave lead II, III and AVf, V4-V3-V4-V5.   3/16-sob little better today. Daughter at bedside.  3/17- daughter at bedside. Pt appears better. palliative care consulted. 3/18-bedside.  Tells me they have decided on going to assisted living with hospice.  They had a discussion with palliative care yesterday 3/19-no issues . 3/20-no overnight issues.   Consultants:   Palliative   Procedures:  1. Left ventricular ejection fraction, by estimation, is 55 to 60%. Deanna  left ventricle has normal function. Deanna left ventricle has no regional  wall motion abnormalities. Left ventricular diastolic function could not  be evaluated. There is Deanna  interventricular septum is flattened in diastole ('D' shaped left  ventricle), consistent with right ventricular volume overload.  2. Right ventricular systolic function is severely reduced. Deanna right  ventricular size is severely enlarged. There is mildly elevated pulmonary  artery systolic pressure. Deanna estimated right ventricular systolic  pressure is 57.8 mmHg.  3. Left atrial size was severely dilated.  4. Right atrial size was giant.  5. Deanna mitral valve is degenerative. Mild mitral valve regurgitation.  Moderate mitral annular calcification.  6. Severely dilated tricuspid annulus with leaflet malcoaptation and  torrential regurgitation. Deanna tricuspid valve is abnormal. Tricuspid valve  regurgitation wide-open.  7. Deanna aortic valve is grossly normal. Aortic valve regurgitation is  trivial. Mild to moderate aortic valve sclerosis/calcification is present,  without any evidence of aortic stenosis.  8. Deanna inferior vena cava is dilated in size with <50% respiratory  variability, suggesting right atrial pressure of 15 mmHg.   Comparison(s): Prior images unable to be directly viewed, comparison made  by report only. Deanna right ventricular hypertrophy is significantly worse.   Antimicrobials:       Subjective: Pt c/o feet feeling "warm" , throwing blanket off of her. No other complaints.  Objective: Vitals:   03/26/20 1928 03/27/20 0644 03/27/20 0651 03/27/20 1214  BP: (!) 147/48  (!) 141/49 (!) 158/51  Pulse: (!) 47  (!) 48 (!) 53  Resp: 20  18 20   Temp: (!) 97.4 F (36.3 C)  97.7 F (36.5 C) 97.8 F (36.6 C)  TempSrc: Oral  Oral Oral  SpO2: 93%  91% 94%  Weight:  68.3 kg    Height:         Intake/Output Summary (Last 24 hours) at 03/27/2020 1219 Last data filed at 03/27/2020 1200 Gross per 24 hour  Intake 660 ml  Output 2000 ml  Net -1340 ml   Filed Weights   03/25/20 0258 03/26/20 0004 03/27/20 0644  Weight: 68.2 kg 68.6 kg 68.3 kg    Examination: Calm, nad cta no w/r/r rrr s1/s2 no gallop Soft benign +bs No edema Grossly intact   Data Reviewed: I have personally reviewed following labs and imaging studies  CBC: Recent Labs  Lab 03/21/20 1252 03/22/20 0301 03/23/20 0404 03/25/20 0521  WBC 6.6 5.0 5.3 6.5  NEUTROABS 4.4  --   --   --   HGB 12.4 11.8* 12.1 11.6*  HCT 39.7 36.2 37.2 35.8*  MCV 91.9 91.4 90.7 91.6  PLT 223 199 203 834   Basic Metabolic Panel: Recent Labs  Lab 03/22/20 0301 03/23/20 0404 03/24/20 0411 03/25/20 0521 03/26/20 0251  NA 137 135 132* 129* 128*  K 5.2* 4.8 5.2* 4.9 4.7  CL 104 99 99 94* 94*  CO2 26 27 25 26 26   GLUCOSE 138* 106* 118* 152* 122*  BUN 69* 68* 71* 75* 75*  CREATININE 2.16* 2.11* 2.34* 2.44* 2.27*  CALCIUM 9.3 9.3 9.1 8.7* 9.0   GFR: Estimated Creatinine Clearance: 14.6 mL/min (A) (by C-G formula based on SCr of 2.27 mg/dL (H)). Liver Function Tests: Recent Labs  Lab 03/21/20 1252  AST 32  ALT 19  ALKPHOS 60  BILITOT 1.7*  PROT 6.8  ALBUMIN 3.4*   No results for input(s): LIPASE, AMYLASE in Deanna last 168 hours. No results for input(s): AMMONIA in Deanna last 168 hours. Coagulation Profile: Recent Labs  Lab 03/23/20 0404 03/24/20 0411 03/25/20 0521 03/26/20 0251 03/27/20 0224  INR 2.4* 2.8* 2.7* 2.7* 2.8*   Cardiac Enzymes: No results for input(s): CKTOTAL, CKMB, CKMBINDEX, TROPONINI in Deanna last 168 hours. BNP (last 3 results) No results for input(s): PROBNP in Deanna last 8760 hours. HbA1C: No results for input(s): HGBA1C in Deanna last 72 hours. CBG: No results for input(s): GLUCAP in Deanna last 168 hours. Lipid Profile: No results for input(s): CHOL, HDL, LDLCALC, TRIG, CHOLHDL,  LDLDIRECT in Deanna last 72 hours. Thyroid Function Tests: No results for input(s): TSH, T4TOTAL, FREET4, T3FREE, THYROIDAB in Deanna last 72 hours. Anemia Panel: No results for input(s): VITAMINB12, FOLATE, FERRITIN, TIBC, IRON, RETICCTPCT in Deanna last 72 hours. Sepsis Labs: Recent Labs  Lab 03/21/20 1427  LATICACIDVEN 0.9    Recent Results (from Deanna past 240 hour(s))  Resp Panel by RT-PCR (Flu A&B, Covid) Nasopharyngeal Swab     Status: None   Collection Time: 03/21/20 12:54 PM   Specimen: Nasopharyngeal Swab; Nasopharyngeal(NP) swabs in vial transport medium  Result Value Ref Range Status   SARS Coronavirus 2 by RT PCR NEGATIVE NEGATIVE Final    Comment: (NOTE) SARS-CoV-2 target nucleic acids are NOT DETECTED.  Deanna SARS-CoV-2 RNA is generally detectable in upper respiratory specimens during Deanna acute phase of infection. Deanna lowest concentration of SARS-CoV-2 viral copies this assay can detect is 138 copies/mL. A negative result does not preclude SARS-Cov-2 infection and should not be used as Deanna sole basis for treatment or other [name] management decisions. A negative result may occur with  improper specimen collection/handling, submission of specimen other than nasopharyngeal swab, presence of viral mutation(s) within  Deanna areas targeted by this assay, and inadequate number of viral copies(<138 copies/mL). A negative result must be combined with clinical observations, [name] history, and epidemiological information. Deanna expected result is Negative.  Fact Sheet for Patients:  EntrepreneurPulse.com.au  Fact Sheet for Healthcare Providers:  IncredibleEmployment.be  This test is no t yet approved or cleared by [place] FDA and  has been authorized for detection and/or diagnosis of SARS-CoV-2 by FDA under an Emergency Use Authorization (EUA). This EUA will remain  in effect (meaning this test can be used) for Deanna duration of  Deanna COVID-19 declaration under Section 564(b)(1) of Deanna Act, 21 U.S.C.section 360bbb-3(b)(1), unless Deanna authorization is terminated  or revoked sooner.       Influenza A by PCR NEGATIVE NEGATIVE Final   Influenza B by PCR NEGATIVE NEGATIVE Final    Comment: (NOTE) Deanna Xpert Xpress SARS-CoV-2/FLU/RSV plus assay is intended as an aid in Deanna diagnosis of influenza from Nasopharyngeal swab specimens and should not be used as a sole basis for treatment. Nasal washings and aspirates are unacceptable for Xpert Xpress SARS-CoV-2/FLU/RSV testing.  Fact Sheet for Patients: EntrepreneurPulse.com.au  Fact Sheet for Healthcare Providers: IncredibleEmployment.be  This test is not yet approved or cleared by [place] FDA and has been authorized for detection and/or diagnosis of SARS-CoV-2 by FDA under an Emergency Use Authorization (EUA). This EUA will remain in effect (meaning this test can be used) for Deanna duration of Deanna COVID-19 declaration under Section 564(b)(1) of Deanna Act, 21 U.S.C. section 360bbb-3(b)(1), unless Deanna authorization is terminated or revoked.  Performed at [facility], [place] [address], [place], [place] [address]   Culture, blood (routine x 2)     Status: None   Collection Time: 03/21/20  2:27 PM   Specimen: BLOOD RIGHT ARM  Result Value Ref Range Status   Specimen Description BLOOD RIGHT ARM  Final   Special Requests   Final    BOTTLES DRAWN AEROBIC AND ANAEROBIC Blood Culture adequate volume   Culture   Final    NO GROWTH 5 DAYS Performed at [facility], [place] [address] [place], [place] [address]    Report Status 03/26/2020 FINAL  Final  Culture, blood (Routine X 2) w Reflex to ID Panel     Status: None (Preliminary result)   Collection Time: 03/22/20  3:01 AM   Specimen: BLOOD  Result Value Ref Range Status   Specimen Description BLOOD RIGHT ANTECUBITAL  Final   Special Requests   Final    BOTTLES  DRAWN AEROBIC ONLY Blood Culture adequate volume   Culture   Final    NO GROWTH 4 DAYS Performed at [facility], [place] [address], [place],  [address]    Report Status PENDING  Incomplete  SARS CORONAVIRUS 2 (TAT 6-24 HRS) Nasopharyngeal Nasopharyngeal Swab     Status: None   Collection Time: 03/24/20  2:09 PM   Specimen: Nasopharyngeal Swab  Result Value Ref Range Status   SARS Coronavirus 2 NEGATIVE NEGATIVE Final    Comment: (NOTE) SARS-CoV-2 target nucleic acids are NOT DETECTED.  Deanna SARS-CoV-2 RNA is generally detectable in upper and lower respiratory specimens during Deanna acute phase of infection. Negative results do not preclude SARS-CoV-2 infection, do not rule out co-infections with other pathogens, and should not be used as Deanna sole basis for treatment or other [name] management decisions. Negative results must be combined with clinical observations, [name] history, and epidemiological information. Deanna expected result  is Negative.  Fact Sheet for Patients: SugarRoll.be  Fact Sheet for Healthcare Providers: https://www.woods-mathews.com/  This test is not yet approved or cleared by [place] FDA and  has been authorized for detection and/or diagnosis of SARS-CoV-2 by FDA under an Emergency Use Authorization (EUA). This EUA will remain  in effect (meaning this test can be used) for Deanna duration of Deanna COVID-19 declaration under Se ction 564(b)(1) of Deanna Act, 21 U.S.C. section 360bbb-3(b)(1), unless Deanna authorization is terminated or revoked sooner.  Performed at [facility], [place] [address], [place], [place] [address]          Radiology Studies: No results found.      Scheduled Meds: . allopurinol  100 mg Oral Daily  . fenofibrate  160 mg Oral Daily  . furosemide  20 mg Oral Daily  . lidocaine  1 patch Transdermal Q24H  . mouth rinse  15 mL Mouth Rinse BID  . multivitamin with  minerals  1 tablet Oral Daily  . pantoprazole  40 mg Oral BID AC  . pravastatin  40 mg Oral Daily  . sodium chloride flush  3 mL Intravenous Q12H  . sucralfate  1 g Oral TID WC & HS  . warfarin  1 mg Oral ONCE-1600  . Warfarin - Pharmacist Dosing Inpatient   Does not apply q1600   Continuous Infusions: . sodium chloride      Assessment & Plan:   Principal Problem:   Acute respiratory failure with hypoxia (HCC) Active Problems:   Hyperlipidemia   Gout   Essential hypertension   ATRIAL FIBRILLATION   Barrett's esophagus   Acute CHF (congestive heart failure) ([place])   Pneumonitis   Encounter for hospice care discussion   Palliative care encounter   1. Acute hypoxemic respiratory failure, combination of cardiogenic pulmonary edema (decomepensated chronic diastolic heart failure) and pneumonitis due to aspiration.  [name] with large hiatal hernia and risk for aspiration and aspiration pneumonitis. Her volume status has improved, documented urine output over last 24 hrs is 800, and blood pressure has been low with systolic 90 to 937 mmHg.  Continue on supplemental 02, currently on 4 l/min per [place] with oxygen saturation at  99%.  3/17-on RA  88-91% 3/20-comfortable. Without complaints of sob. Continue low dose 20mg  lasix Aspiration precautions  Plan for discharge with hospice care       2.hyponatremia-  Etiology unclear. From diuresis v.s. getting fluid overnight Its a fine line  Will d/c ivf 3/19 -sodium down to 128  3/20-Na level pending Continue Lasix    3. Paroxysmal atrial fibrillation.  Had junctional bradycardia -secondary to digoxin  3/19 Digoxin was d/c'd 3/20-Had a long  discussion with daughter about anticoagulation with warfarin will need to be continued as [name] is going on hospice care.  Discuss benefits such as stroke prevention versus risk and also [name] needing monitoring of her INR. Daughter would like to stop treatment with warfarin.     4.  HTN. Overall stable     5. AKI on CKD stage 3a/ hyperkalemia. Renal function with worsening cr up to 2,16 with K at 5,2 and serum bicarbonate at 26. 3/17- creatinine up mildly today 3/18-creatinine up mildly 2.44 3/19 creatinine improving to 2.27  Continue with lasix      6. Dyslipidemia.  Continue pravastatin and fenofibrate     7. Gout.  No acute flares Continue allopurinol   8. Anxiety.  As needed alprazolam     DVT prophylaxis:  scd Code Status:DNR Family Communication: daughter at bedside  Status is: Inpatient  Remains inpatient appropriate because:unsafe d/c  Dispo: Deanna [name] is from: Home              Anticipated d/c is to: assisted living with hospice              [name] currently medically stable.   Difficult to place [name] No Placement to assisted living with hospice pending           LOS: 6 days   Time spent: 35 min with >50% on coc    Nolberto Hanlon, MD [facility] Pager [phone]  If 7PM-7AM, please contact night-coverage 03/27/2020, 12:19 PM

## 2020-03-27 NOTE — TOC Progression Note (Addendum)
Transition of Care [facility]) - Progression Note    Patient Details  Name: Deanna [name] MRN: [id number] Date of Birth: 01-21-1926  Transition [facility] Care [facility]) CM/SW [place], [place] Phone Number:  [phone] 03/27/2020, 10:21 AM  Clinical Narrative:     CSW was alerted that patient was medically ready for discharge. CSW reached out to patient's daughter to receive more information about patient's new placement to [facility]. Daughter gave CSW Jessica's number at facility to follow up on [phone].   Janett Billow explained that the facility does not do weekend admission. Janett Billow also explained that a paper fl2 addendum needed to be completed. CSW explained that a fl2 had been completed. Janett Billow stated that they needed that form for admission. The paper fl2 will need to be signed by MD as well. Janett Billow provided CSW with fax number [phone] and also stated that she needed chest x rays results. New covid is not needed.  12:03pm- CSW faxed addendum fl2 and chest X-ray results to facility.   Patient can be discharged in the morning.  TOC team will continue to assist with discharge planning needs.   Expected Discharge Plan: [place] Barriers to Discharge: Continued Medical Work up  Expected Discharge Plan and Services Expected Discharge Plan: [place] In-house Referral: Clinical Social Work Discharge Planning Services: CM Consult Post Acute Care Choice: [place] arrangements for the past 2 months: [place]                 DME Arranged: N/A DME Agency: NA       HH Arranged: NA [place] Agency: NA         Social Determinants of Health (SDOH) Interventions    Readmission Risk Interventions No flowsheet data found.

## 2020-03-28 LAB — CBC
HCT: 38.4 % (ref 36.0–46.0)
Hemoglobin: 12.3 g/dL (ref 12.0–15.0)
MCH: 28.9 pg (ref 26.0–34.0)
MCHC: 32 g/dL (ref 30.0–36.0)
MCV: 90.4 fL (ref 80.0–100.0)
Platelets: 202 10*3/uL (ref 150–400)
RBC: 4.25 MIL/uL (ref 3.87–5.11)
RDW: 17.4 % — ABNORMAL HIGH (ref 11.5–15.5)
WBC: 4.9 10*3/uL (ref 4.0–10.5)
nRBC: 0 % (ref 0.0–0.2)

## 2020-03-28 LAB — PROTIME-INR
INR: 2.3 — ABNORMAL HIGH (ref 0.8–1.2)
Prothrombin Time: 24.3 seconds — ABNORMAL HIGH (ref 11.4–15.2)

## 2020-03-28 LAB — SARS CORONAVIRUS 2 (TAT 6-24 HRS): SARS Coronavirus 2: NEGATIVE

## 2020-03-28 MED ORDER — SUCRALFATE 1 G PO TABS: 1.0000 g | ORAL_TABLET | Freq: Four times a day (QID) | ORAL | 0 refills | Status: DC

## 2020-03-28 MED ORDER — PANTOPRAZOLE SODIUM 40 MG PO TBEC: 40.0000 mg | DELAYED_RELEASE_TABLET | Freq: Two times a day (BID) | ORAL | 0 refills | Status: DC

## 2020-03-28 MED ORDER — LIDOCAINE 5 % EX PTCH: 1.0000 | MEDICATED_PATCH | CUTANEOUS | 0 refills | Status: DC

## 2020-03-28 NOTE — Care Management Important Message (Signed)
Important Message  Patient Details  Name: Deanna [name] MRN: [id number] Date of Birth: 1926-12-31   Medicare Important Message Given:  Yes     Shelda Altes 03/28/2020, 10:36 AM

## 2020-03-28 NOTE — Progress Notes (Signed)
SATURATION QUALIFICATIONS: (This note is used to comply with regulatory documentation for home oxygen)  Patient Saturations on Room Air at Rest = 84%  Patient Saturations on Room Air while Ambulating = 85%  Patient Saturations on 2 Liters of oxygen while Ambulating = 95 %  Please briefly explain why patient needs home oxygen:needed

## 2020-03-28 NOTE — Progress Notes (Signed)
D/C instructions given and reviewed. Tele and IV removed, tolerated well. Awaiting PTAR.

## 2020-03-28 NOTE — Plan of Care (Signed)

## 2020-03-28 NOTE — TOC Progression Note (Addendum)
Transition of Care [facility]) - Progression Note    Patient Details  Name: Deanna [name] MRN: [id number] Date of Birth: Aug 18, 1926  Transition of Care [facility]) CM/SW [place], [place] Phone Number: 03/28/2020, 11:51 AM  Clinical Narrative:    12:32pm- CSW contacted [employer] for pt o2, CSW waiting on delivery to call PTAR.  12:23pm- CSW spoke with Nanine Means to let them know that pt will have o2 delivered today before being DC there.  11:15am- CSW spoke with pt daughter, she states that the facility will provide [facility] and after a week or so they will begin Hospice services. Pt daughter sates that [facility] Means will handle everything but she does need transportation. Pt needs oxygen pt daughter agreed to use [employer] agency, CSW will contact PTAR when pt is ready for DC and o2 has been delivered to [place], (pt will being room 7).   Expected Discharge Plan: [place] Barriers to Discharge: Continued Medical Work up  Expected Discharge Plan and Services Expected Discharge Plan: [place] In-house Referral: Clinical Social Work Discharge Planning Services: CM Consult Post Acute Care Choice: [place] arrangements for the past 2 months: [place]                 DME Arranged: N/A DME Agency: NA       HH Arranged: NA [place] Agency: NA         Social Determinants of Health (SDOH) Interventions    Readmission Risk Interventions No flowsheet data found.

## 2020-03-28 NOTE — Discharge Summary (Signed)
Deanna [name] DSK:[id number] DOB: February 26, 1926 DOA: 03/21/2020  PCP: Deanna Manes, MD  Admit date: 03/21/2020 Discharge date: 03/28/2020  Admitted From: Deanna [name] Disposition: [place] with hospice   Recommendations for Outpatient Follow-up:  1. Follow up with PCP in 1 week 2. Please obtain BMP/CBC in one week 3. Hospice to be setup at [facility]. NO readmission when patient on hospice.     Discharge Condition:Stable CODE STATUS:DNR  Diet recommendation: Heart Healthy  Brief/Interim Summary: IOM:BTDHR Deanna [name] is a 87 y.o. female with medical history significant of poorly controlled HTN, chronic diastolic CHF, PAF on Coumadin, breast cancer status post mastectomy, Gout, multi-joint OA, HLD, intermittent increasing shortness of breath for 1 week.Chest radiograph with  Results below.  Had CT chest:  1. Cardiac enlargement.  No pericardial effusion. 2. Aberrant right subclavian artery. 3. Very large hiatal hernia with the entire stomach up in the chest along with part of the transverse colon. 4. Areas of subsegmental atelectasis adjacent to the large hiatal hernia sac. 5. No acute pulmonary findings or worrisome pulmonary lesions. 6. Aortic atherosclerosis. Aortic Atherosclerosis   Patient was admitted to the hospital for acute hypoxic respiratory failure due to aspiration and diastolic HF:    1. Acute hypoxemic respiratory failure, combination of cardiogenic pulmonary edema (decomepensated chronic diastolic heart failure) and pneumonitis due to aspiration.  Was treated with IV lasix, once euvolemic was switched to lasix po Continue  20mg  lasix po Aspiration precautions  Plan for discharge with hospice care       2.hyponatremia-  Etiology unclear. From diuresis v.s. getting fluid overnight Its a fine line with treatment. Was treated with some ivf and also other times with iv lasix. Mentation stable.    3. Paroxysmal atrial fibrillation.  Had junctional  bradycardia -secondary to digoxin  Digoxin was discontinued Had a long  discussion with daughter about anticoagulation with warfarin whether to continue or discontinue since going with hospice care.   Discuss benefits such as stroke prevention versus risk and also patient needing monitoring of her INR. Daughter would like to stop treatment with warfarin. Coumadin was discontinued.      4. HTN. Overall stable off meds     5. AKI on CKD stage 3a/ hyperkalemia. stablized     6. Dyslipidemia.  Continue pravastatin and fenofibrate     7. Gout.  No acute flares Continue allopurinol   8. Anxiety,  9.palliative care was consulted. I spoke to family they are interested in hospice. They will return to [facility] and they will seek hospice there. They do not want patient to be readmitted as her overall prognosis is not good.     Discharge Diagnoses:  Principal Problem:   Acute respiratory failure with hypoxia ([place]) Active Problems:   Hyperlipidemia   Gout   Essential hypertension   ATRIAL FIBRILLATION   Barrett's esophagus   Acute CHF (congestive heart failure) (HCC)   Pneumonitis   Encounter for hospice care discussion   Palliative care encounter    Discharge Instructions  Discharge Instructions    Diet - low sodium heart healthy   Complete by: As directed    Increase activity slowly   Complete by: As directed      Allergies as of 03/28/2020      Reactions   Aspirin Other (See Comments)   REACTION: nervousness---tolerates ibuprofen   Atorvastatin Other (See Comments)   myalgia      Medication List    STOP taking these medications   amLODipine 5 MG tablet  Commonly known as: NORVASC   Digox 0.125 MG tablet Generic drug: digoxin   hydrALAZINE 25 MG tablet Commonly known as: APRESOLINE   losartan 25 MG tablet Commonly known as: COZAAR   omeprazole 20 MG capsule Commonly known as: PRILOSEC Replaced by: pantoprazole 40 MG tablet    spironolactone 50 MG tablet Commonly known as: ALDACTONE   warfarin 1 MG tablet Commonly known as: COUMADIN     TAKE these medications   acetaminophen 325 MG tablet Commonly known as: TYLENOL Take 650 mg by mouth every 6 (six) hours as needed for moderate pain.   allopurinol 100 MG tablet Commonly known as: ZYLOPRIM Take 100 mg by mouth every morning.   ALPRAZolam 0.5 MG tablet Commonly known as: XANAX Take 1 tablet (0.5 mg total) by mouth at bedtime as needed for anxiety. What changed: when to take this   carboxymethylcellul-glycerin 0.5-0.9 % ophthalmic solution Commonly known as: REFRESH OPTIVE Place 1 drop into both eyes daily as needed for dry eyes.   fenofibrate 160 MG tablet TAKE 1 TABLET BY MOUTH  DAILY What changed: when to take this   furosemide 20 MG tablet Commonly known as: LASIX Take 20 mg by mouth every morning.   HAIR/SKIN/NAILS PO Take 1 tablet by mouth every morning.   lidocaine 5 % Commonly known as: LIDODERM Place 1 patch onto the skin daily. Remove & Discard patch within 12 hours or as directed by MD Start taking on: March 29, 2020   multivitamin with minerals Tabs tablet Take 1 tablet by mouth every morning.   pantoprazole 40 MG tablet Commonly known as: PROTONIX Take 1 tablet (40 mg total) by mouth 2 (two) times daily before a meal. Replaces: omeprazole 20 MG capsule   pravastatin 40 MG tablet Commonly known as: PRAVACHOL TAKE 1 TABLET BY MOUTH  DAILY What changed: when to take this   sodium chloride 0.65 % Soln nasal spray Commonly known as: OCEAN Place 1 spray into both nostrils 4 (four) times daily as needed for congestion.   sucralfate 1 g tablet Commonly known as: CARAFATE Take 1 tablet (1 g total) by mouth 4 (four) times daily.   Systane Balance 0.6 % Soln Generic drug: Propylene Glycol Place 1 drop into both eyes in the morning, at noon, in the evening, and at bedtime.   Vitamin D 50 MCG (2000 UT) tablet Take 2,000  Units by mouth every morning.            Durable Medical Equipment  (From admission, onward)         Start     Ordered   03/28/20 1333  For home use only DME oxygen  Once       Question Answer Comment  Length of Need Lifetime   Mode or (Route) Nasal cannula   Liters per Minute 2   Oxygen conserving device Yes   Oxygen delivery system Gas      03/28/20 1332          Allergies  Allergen Reactions  . Aspirin Other (See Comments)    REACTION: nervousness---tolerates ibuprofen  . Atorvastatin Other (See Comments)    myalgia    Consultations:  Cardiology, palliative care   Procedures/Studies: CT Chest Wo Contrast  Result Date: 03/21/2020 CLINICAL DATA:  Shortness of breath and cough. EXAM: CT CHEST WITHOUT CONTRAST TECHNIQUE: Multidetector CT imaging of the chest was performed following the standard protocol without IV contrast. COMPARISON:  Chest x-ray, same date. FINDINGS: Cardiovascular: Significant cardiac enlargement involving  all 4 chambers. No pericardial effusion. Some fluid noted in the pericardial recesses. The aorta is normal in caliber. Moderate tortuosity and atherosclerotic calcifications are noted. Aberrant right subclavian artery with moderate atherosclerotic calcifications. Fairly extensive three-vessel coronary artery calcifications. Mediastinum/Nodes: Scattered mediastinal and hilar lymph nodes but no mass or overt adenopathy. The esophagus is grossly normal. There is a very large hiatal hernia not significantly changed when compared to a prior abdominal CT scan 2020. The entire stomach is up in the chest along with part of the transverse colon. Lungs/Pleura: The lungs are clear of an acute process. There is moderate streaky subsegmental atelectasis adjacent to the large hiatal hernia sac. No worrisome pulmonary lesions. No pulmonary edema or pleural effusions. Upper Abdomen: No significant upper abdominal findings. Musculoskeletal: Bilateral breast prostheses.   No breast masses. The bony thorax is intact. IMPRESSION: 1. Cardiac enlargement.  No pericardial effusion. 2. Aberrant right subclavian artery. 3. Very large hiatal hernia with the entire stomach up in the chest along with part of the transverse colon. 4. Areas of subsegmental atelectasis adjacent to the large hiatal hernia sac. 5. No acute pulmonary findings or worrisome pulmonary lesions. 6. Aortic atherosclerosis. Aortic Atherosclerosis (ICD10-I70.0). Electronically Signed   By: Marijo Sanes M.D.   On: 03/21/2020 16:13   DG Chest Port 1 View  Result Date: 03/21/2020 CLINICAL DATA:  Shortness of breath EXAM: PORTABLE CHEST 1 VIEW COMPARISON:  None. FINDINGS: There is consolidation in the left lower lobe medially. The lungs elsewhere are clear. There is cardiomegaly with pulmonary vascularity normal. There is aortic atherosclerosis. There is a large paraesophageal type hernia. No adenopathy appreciable. There are surgical clips in the right axillary region. IMPRESSION: 1. Left lower lobe consolidation medially, likely due to pneumonia. A lesion obstructing the left lower lobe bronchus cannot be excluded on this study. 2.  Cardiomegaly. 3.  Large paraesophageal type hernia. 4.  Aortic Atherosclerosis (ICD10-I70.0). Followup PA and lateral chest radiographs recommended in 3-4 weeks following trial of antibiotic therapy to ensure resolution and exclude underlying malignancy. Electronically Signed   By: Lowella Grip III M.D.   On: 03/21/2020 12:51   DG Swallowing Func-Speech Pathology  Result Date: 03/23/2020 Objective Swallowing Evaluation: Type of Study: MBS-Modified Barium Swallow Study  Patient Details Name: ITHA KROEKER MRN: [id number] Date of Birth: Sep 17, 1926 Today's Date: 03/23/2020 Time: SLP Start Time (ACUTE ONLY): 1428 -SLP Stop Time (ACUTE ONLY): 1444 SLP Time Calculation (min) (ACUTE ONLY): 16 min Past Medical History: Past Medical History: Diagnosis Date . Anxiety  . Arthritis  . Atrial  fibrillation ([place])  . Barrett's esophagus  . Breast cancer ([place])  . Colon polyp 2009  TUBULAR ADENOMA . Diverticulosis of colon (without mention of hemorrhage) 2009 . GERD (gastroesophageal reflux disease)  . Hyperlipidemia  . Hypertension  . Status post dilation of esophageal narrowing  . TRICUSPID REGURGITATION 10/23/2006 Past Surgical History: Past Surgical History: Procedure Laterality Date . ABDOMINAL HYSTERECTOMY   . bcc-face   . CATARACT EXTRACTION Bilateral  . MASTECTOMY Bilateral  HPI: Patient is a 87 y/o female with a working diagnosis of acute diastolic heart failure exacerbation, complicated with acute pneumonitis and acute hypoxemic respiratory failure. CXR 3/14: Left lower lobe consolidation medially, likely due to pneumonia. CT Chest 3/14: large hiatal hernia with the entire stomach up in the chest along with part of the transverse colon; No acute pulmonary findings or worrisome pulmonary lesions. PMH includes GERD, Barrett's esophagus, HTN, breast ca, A-fib, tricuspid regurgitation.  Subjective: Pt was pleasant  and agreeable Assessment / Plan / Recommendation CHL IP CLINICAL IMPRESSIONS 03/23/2020 Clinical Impression Pt presents with an oropharyngeal swallow that is [facility] in which she demonstrated appropriate airway protection of all consistencies. Her oral phase was characterized by multiple pumps to propel the boluses back with solid consistencies; but was overall functional and timely with no residue noted. Instances of penetration (PAS 2) were noted with larger consecutive sips of thin liquids; however, the pentrates cleared during her swallow. Her swallow initation for thin liquids occured at the pyiform sinuses but it was still timely and efficient. Provided education and a handout on esophageal precautions. MD may want to consider consultation with GI if her esophageal symptoms continue to persist (belching was noted during her assessment this morning as well as after her MBS). Given these  results, recommend a regular diet and thin liquids - SLP will sign off at this time.  SLP Visit Diagnosis Dysphagia, unspecified (R13.10) Attention and concentration deficit following -- Frontal lobe and executive function deficit following -- Impact on safety and function Mild aspiration risk   CHL IP TREATMENT RECOMMENDATION 03/23/2020 Treatment Recommendations No treatment recommended at this time   Prognosis 03/23/2020 Prognosis for Safe Diet Advancement Good Barriers to Reach Goals -- Barriers/Prognosis Comment -- CHL IP DIET RECOMMENDATION 03/23/2020 SLP Diet Recommendations Regular solids;Thin liquid Liquid Administration via Cup;Straw Medication Administration Whole meds with liquid Compensations Slow rate;Small sips/bites;Follow solids with liquid Postural Changes Remain semi-upright after after feeds/meals (Comment);Seated upright at 90 degrees   CHL IP OTHER RECOMMENDATIONS 03/23/2020 Recommended Consults Consider GI evaluation Oral Care Recommendations Oral care BID Other Recommendations --   CHL IP FOLLOW UP RECOMMENDATIONS 03/23/2020 Follow up Recommendations None   No flowsheet data found.     CHL IP ORAL PHASE 03/23/2020 Oral Phase WFL Oral - Pudding Teaspoon -- Oral - Pudding Cup -- Oral - Honey Teaspoon -- Oral - Honey Cup -- Oral - Nectar Teaspoon -- Oral - Nectar Cup -- Oral - Nectar Straw -- Oral - Thin Teaspoon -- Oral - Thin Cup -- Oral - Thin Straw -- Oral - Puree -- Oral - Mech Soft -- Oral - Regular -- Oral - Multi-Consistency -- Oral - Pill -- Oral Phase - Comment --  CHL IP PHARYNGEAL PHASE 03/23/2020 Pharyngeal Phase Impaired Pharyngeal- Pudding Teaspoon -- Pharyngeal -- Pharyngeal- Pudding Cup -- Pharyngeal -- Pharyngeal- Honey Teaspoon -- Pharyngeal -- Pharyngeal- Honey Cup -- Pharyngeal -- Pharyngeal- Nectar Teaspoon -- Pharyngeal -- Pharyngeal- Nectar Cup -- Pharyngeal -- Pharyngeal- Nectar Straw -- Pharyngeal -- Pharyngeal- Thin Teaspoon -- Pharyngeal -- Pharyngeal- Thin Cup WFL Pharyngeal  -- Pharyngeal- Thin Straw Penetration/Aspiration before swallow Pharyngeal Material enters airway, remains ABOVE vocal cords then ejected out Pharyngeal- Puree WFL Pharyngeal -- Pharyngeal- Mechanical Soft WFL Pharyngeal -- Pharyngeal- Regular -- Pharyngeal -- Pharyngeal- Multi-consistency -- Pharyngeal -- Pharyngeal- Pill WFL Pharyngeal -- Pharyngeal Comment --  CHL IP CERVICAL ESOPHAGEAL PHASE 03/23/2020 Cervical Esophageal Phase WFL Pudding Teaspoon -- Pudding Cup -- Honey Teaspoon -- Honey Cup -- Nectar Teaspoon -- Nectar Cup -- Nectar Straw -- Thin Teaspoon -- Thin Cup -- Thin Straw -- Puree -- Mechanical Soft -- Regular -- Multi-consistency -- Pill -- Cervical Esophageal Comment -- Note populated for Lebron Conners, Student SLP Osie Bond., M.A. [place] [facility] Pager [phone] Office [phone] 03/23/2020, 4:00 PM              ECHOCARDIOGRAM COMPLETE  Result Date: 03/22/2020    ECHOCARDIOGRAM REPORT   Patient Name:  Pearson Grippe Date of Exam: 03/22/2020 Medical Rec #:  [id number]      Height:       62.0 in Accession #:    [id number]     Weight:       146.2 lb Date of Birth:  [birth year]      BSA:          1.673 m Patient Age:    3 years       BP:           143/55 mmHg Patient Gender: F              HR:           79 bpm. Exam Location:  Inpatient Procedure: 2D Echo, Cardiac Doppler and Color Doppler Indications:    CHF-Acute Diastolic  History:        Patient has prior history of Echocardiogram examinations, most                 recent 09/22/2015. CHF, Arrythmias:Atrial Fibrillation; Risk                 Factors:Dyslipidemia and Hypertension. Tricuspid regurgitation.                 GERD. Bilateral mastectomies with bilateral breast implants.  Sonographer:    Clayton Lefort RDCS (AE) Referring Phys: [id number] Lequita Halt  Sonographer Comments: Technically challenging study due to limited acoustic windows, Technically difficult study due to poor echo windows and no parasternal window. Image  acquisition challenging due to breast implants and Image acquisition challenging due to mastectomy. Attempts at parasternal images later in study were taken from apical position. IMPRESSIONS  1. Left ventricular ejection fraction, by estimation, is 55 to 60%. The left ventricle has normal function. The left ventricle has no regional wall motion abnormalities. Left ventricular diastolic function could not be evaluated. There is the interventricular septum is flattened in diastole ('D' shaped left ventricle), consistent with right ventricular volume overload.  2. Right ventricular systolic function is severely reduced. The right ventricular size is severely enlarged. There is mildly elevated pulmonary artery systolic pressure. The estimated right ventricular systolic pressure is 34.7 mmHg.  3. Left atrial size was severely dilated.  4. Right atrial size was giant.  5. The mitral valve is degenerative. Mild mitral valve regurgitation. Moderate mitral annular calcification.  6. Severely dilated tricuspid annulus with leaflet malcoaptation and torrential regurgitation. The tricuspid valve is abnormal. Tricuspid valve regurgitation wide-open.  7. The aortic valve is grossly normal. Aortic valve regurgitation is trivial. Mild to moderate aortic valve sclerosis/calcification is present, without any evidence of aortic stenosis.  8. The inferior vena cava is dilated in size with <50% respiratory variability, suggesting right atrial pressure of 15 mmHg. Comparison(s): Prior images unable to be directly viewed, comparison made by report only. The right ventricular hypertrophy is significantly worse. FINDINGS  Left Ventricle: Left ventricular ejection fraction, by estimation, is 55 to 60%. The left ventricle has normal function. The left ventricle has no regional wall motion abnormalities. The left ventricular internal cavity size was normal in size. There is  no left ventricular hypertrophy. The interventricular septum is  flattened in diastole ('D' shaped left ventricle), consistent with right ventricular volume overload. Left ventricular diastolic function could not be evaluated due to atrial fibrillation. Left ventricular diastolic function could not be evaluated. Right Ventricle: The right ventricular size is severely enlarged. No increase in right ventricular wall thickness. Right ventricular systolic function is  severely reduced. There is mildly elevated pulmonary artery systolic pressure. The tricuspid regurgitant velocity is 2.13 m/s, and with an assumed right atrial pressure of 15 mmHg, the estimated right ventricular systolic pressure is 77.8 mmHg. Left Atrium: Left atrial size was severely dilated. Right Atrium: Right atrial size was giant. Pericardium: There is no evidence of pericardial effusion. Mitral Valve: The mitral valve is degenerative in appearance. There is moderate thickening of the mitral valve leaflet(s). Moderate mitral annular calcification. Mild mitral valve regurgitation, with centrally-directed jet. MV peak gradient, 7.5 mmHg. The mean mitral valve gradient is 2.0 mmHg. Tricuspid Valve: Severely dilated tricuspid annulus with leaflet malcoaptation and torrential regurgitation. The tricuspid valve is abnormal. Tricuspid valve regurgitation wide-open. Aortic Valve: The aortic valve is grossly normal. Aortic valve regurgitation is trivial. Mild to moderate aortic valve sclerosis/calcification is present, without any evidence of aortic stenosis. Aortic valve mean gradient measures 4.3 mmHg. Aortic valve  peak gradient measures 7.8 mmHg. Pulmonic Valve: The pulmonic valve was not well visualized. Pulmonic valve regurgitation is not visualized. Aorta: The aortic root is normal in size and structure. Venous: The inferior vena cava is dilated in size with less than 50% respiratory variability, suggesting right atrial pressure of 15 mmHg. IAS/Shunts: No atrial level shunt detected by color flow Doppler.  RIGHT  VENTRICLE            IVC RV Basal diam:  5.50 cm    IVC diam: 3.40 cm RV Mid diam:    5.10 cm RV S prime:     7.00 cm/s TAPSE (M-mode): 1.6 cm LEFT ATRIUM              Index       RIGHT ATRIUM           Index LA Vol (A2C):   86.7 ml  51.82 ml/m RA Area:     47.10 cm LA Vol (A4C):   95.9 ml  57.32 ml/m RA Volume:   209.00 ml 124.91 ml/m LA Biplane Vol: 101.0 ml 60.36 ml/m  AORTIC VALVE AV Vmax:           139.33 cm/s AV Vmean:          95.833 cm/s AV VTI:            0.300 m AV Peak Grad:      7.8 mmHg AV Mean Grad:      4.3 mmHg LVOT Vmax:         62.82 cm/s LVOT Vmean:        42.175 cm/s LVOT VTI:          0.141 m LVOT/AV VTI ratio: 0.47 MITRAL VALVE                TRICUSPID VALVE MV Area (PHT): 3.85 cm     TR Peak grad:   18.1 mmHg MV Peak grad:  7.5 mmHg     TR Vmax:        213.00 cm/s MV Mean grad:  2.0 mmHg MV Vmax:       1.37 m/s     SHUNTS MV Vmean:      55.0 cm/s    Systemic VTI: 0.14 m MV Decel Time: 197 msec MV E velocity: 123.00 cm/s Dani Gobble Croitoru MD Electronically signed by Sanda Klein MD Signature Date/Time: 03/22/2020/2:23:18 PM    Final       Subjective:   Discharge Exam: Vitals:   03/28/20 0843 03/28/20 1056  BP: (!) 132/47 (!) 122/44  Pulse: Marland Kitchen)  53 (!) 43  Resp: (!) 22 18  Temp: 98.2 F (36.8 C) 98.2 F (36.8 C)  SpO2: 99% 95%   Vitals:   03/27/20 1948 03/28/20 0447 03/28/20 0843 03/28/20 1056  BP: (!) 156/68 (!) 164/65 (!) 132/47 (!) 122/44  Pulse: (!) 58 (!) 53 (!) 53 (!) 43  Resp: 18 18 (!) 22 18  Temp:  97.7 F (36.5 C) 98.2 F (36.8 C) 98.2 F (36.8 C)  TempSrc: Oral Oral Oral Oral  SpO2: 95% 99% 99% 95%  Weight:      Height:        General: Pt is alert, awake, nad Cardiovascular: RRR, S1/S2 +, no rubs, no gallops Respiratory: CTA bilaterally, no wheezing, no rhonchi Abdominal: Soft, NT, ND, bowel sounds + Extremities: no edema, chronic skin changes    The results of significant diagnostics from this hospitalization (including imaging,  microbiology, ancillary and laboratory) are listed below for reference.     Microbiology: Recent Results (from the past 240 hour(s))  Resp Panel by RT-PCR (Flu A&B, Covid) Nasopharyngeal Swab     Status: None   Collection Time: 03/21/20 12:54 PM   Specimen: Nasopharyngeal Swab; Nasopharyngeal(NP) swabs in vial transport medium  Result Value Ref Range Status   SARS Coronavirus 2 by RT PCR NEGATIVE NEGATIVE Final    Comment: (NOTE) SARS-CoV-2 target nucleic acids are NOT DETECTED.  The SARS-CoV-2 RNA is generally detectable in upper respiratory specimens during the acute phase of infection. The lowest concentration of SARS-CoV-2 viral copies this assay can detect is 138 copies/mL. A negative result does not preclude SARS-Cov-2 infection and should not be used as the sole basis for treatment or other patient management decisions. A negative result may occur with  improper specimen collection/handling, submission of specimen other than nasopharyngeal swab, presence of viral mutation(s) within the areas targeted by this assay, and inadequate number of viral copies(<138 copies/mL). A negative result must be combined with clinical observations, patient history, and epidemiological information. The expected result is Negative.  Fact Sheet for Patients:  EntrepreneurPulse.com.au  Fact Sheet for Healthcare Providers:  IncredibleEmployment.be  This test is no t yet approved or cleared by the [place] FDA and  has been authorized for detection and/or diagnosis of SARS-CoV-2 by FDA under an Emergency Use Authorization (EUA). This EUA will remain  in effect (meaning this test can be used) for the duration of the COVID-19 declaration under Section 564(b)(1) of the Act, 21 U.S.C.section 360bbb-3(b)(1), unless the authorization is terminated  or revoked sooner.       Influenza A by PCR NEGATIVE NEGATIVE Final   Influenza B by PCR NEGATIVE NEGATIVE  Final    Comment: (NOTE) The Xpert Xpress SARS-CoV-2/FLU/RSV plus assay is intended as an aid in the diagnosis of influenza from Nasopharyngeal swab specimens and should not be used as a sole basis for treatment. Nasal washings and aspirates are unacceptable for Xpert Xpress SARS-CoV-2/FLU/RSV testing.  Fact Sheet for Patients: EntrepreneurPulse.com.au  Fact Sheet for Healthcare Providers: IncredibleEmployment.be  This test is not yet approved or cleared by the [place] FDA and has been authorized for detection and/or diagnosis of SARS-CoV-2 by FDA under an Emergency Use Authorization (EUA). This EUA will remain in effect (meaning this test can be used) for the duration of the COVID-19 declaration under Section 564(b)(1) of the Act, 21 U.S.C. section 360bbb-3(b)(1), unless the authorization is terminated or revoked.  Performed at [facility] Lab, [place] [address] [place], [place] [address]   Culture,  blood (routine x 2)     Status: None   Collection Time: 03/21/20  2:27 PM   Specimen: BLOOD RIGHT ARM  Result Value Ref Range Status   Specimen Description BLOOD RIGHT ARM  Final   Special Requests   Final    BOTTLES DRAWN AEROBIC AND ANAEROBIC Blood Culture adequate volume   Culture   Final    NO GROWTH 5 DAYS Performed at [facility], [address], [place], [place] [address]    Report Status 03/26/2020 FINAL  Final  Culture, blood (Routine X 2) w Reflex to ID Panel     Status: None (Preliminary result)   Collection Time: 03/22/20  3:01 AM   Specimen: BLOOD  Result Value Ref Range Status   Specimen Description BLOOD RIGHT ANTECUBITAL  Final   Special Requests   Final    BOTTLES DRAWN AEROBIC ONLY Blood Culture adequate volume   Culture   Final    NO GROWTH 4 DAYS Performed at [facility], [place] [address], [place], [place] [address]    Report Status PENDING  Incomplete  SARS CORONAVIRUS 2 (TAT 6-24 HRS)  Nasopharyngeal Nasopharyngeal Swab     Status: None   Collection Time: 03/24/20  2:09 PM   Specimen: Nasopharyngeal Swab  Result Value Ref Range Status   SARS Coronavirus 2 NEGATIVE NEGATIVE Final    Comment: (NOTE) SARS-CoV-2 target nucleic acids are NOT DETECTED.  The SARS-CoV-2 RNA is generally detectable in upper and lower respiratory specimens during the acute phase of infection. Negative results do not preclude SARS-CoV-2 infection, do not rule out co-infections with other pathogens, and should not be used as the sole basis for treatment or other patient management decisions. Negative results must be combined with clinical observations, patient history, and epidemiological information. The expected result is Negative.  Fact Sheet for Patients: SugarRoll.be  Fact Sheet for Healthcare Providers: https://www.woods-mathews.com/  This test is not yet approved or cleared by the [place] FDA and  has been authorized for detection and/or diagnosis of SARS-CoV-2 by FDA under an Emergency Use Authorization (EUA). This EUA will remain  in effect (meaning this test can be used) for the duration of the COVID-19 declaration under Se ction 564(b)(1) of the Act, 21 U.S.C. section 360bbb-3(b)(1), unless the authorization is terminated or revoked sooner.  Performed at [facility], [place] [address] [place], [place] [address]      Labs: BNP (last 3 results) Recent Labs    03/21/20 1254 03/23/20 0404  BNP 150.1* 683.4*   Basic Metabolic Panel: Recent Labs  Lab 03/22/20 0301 03/23/20 0404 03/24/20 0411 03/25/20 0521 03/26/20 0251  NA 137 135 132* 129* 128*  K 5.2* 4.8 5.2* 4.9 4.7  CL 104 99 99 94* 94*  CO2 26 27 25 26 26   GLUCOSE 138* 106* 118* 152* 122*  BUN 69* 68* 71* 75* 75*  CREATININE 2.16* 2.11* 2.34* 2.44* 2.27*  CALCIUM 9.3 9.3 9.1 8.7* 9.0   Liver Function Tests: No results for input(s): AST, ALT, ALKPHOS,  BILITOT, PROT, ALBUMIN in the last 168 hours. No results for input(s): LIPASE, AMYLASE in the last 168 hours. No results for input(s): AMMONIA in the last 168 hours. CBC: Recent Labs  Lab 03/22/20 0301 03/23/20 0404 03/25/20 0521 03/28/20 0554  WBC 5.0 5.3 6.5 4.9  HGB 11.8* 12.1 11.6* 12.3  HCT 36.2 37.2 35.8* 38.4  MCV 91.4 90.7 91.6 90.4  PLT 199 203 173 202   Cardiac  Enzymes: No results for input(s): CKTOTAL, CKMB, CKMBINDEX, TROPONINI in the last 168 hours. BNP: Invalid input(s): POCBNP CBG: No results for input(s): GLUCAP in the last 168 hours. D-Dimer No results for input(s): DDIMER in the last 72 hours. Hgb A1c No results for input(s): HGBA1C in the last 72 hours. Lipid Profile No results for input(s): CHOL, HDL, LDLCALC, TRIG, CHOLHDL, LDLDIRECT in the last 72 hours. Thyroid function studies No results for input(s): TSH, T4TOTAL, T3FREE, THYROIDAB in the last 72 hours.  Invalid input(s): FREET3 Anemia work up No results for input(s): VITAMINB12, FOLATE, FERRITIN, TIBC, IRON, RETICCTPCT in the last 72 hours. Urinalysis    Component Value Date/Time   COLORURINE YELLOW 06/09/2018 0400   APPEARANCEUR CLEAR 06/09/2018 0400   LABSPEC 1.010 06/09/2018 0400   PHURINE 6.0 06/09/2018 0400   GLUCOSEU NEGATIVE 06/09/2018 0400   HGBUR NEGATIVE 06/09/2018 0400   HGBUR negative 03/02/2008 1312   BILIRUBINUR NEGATIVE 06/09/2018 0400   KETONESUR NEGATIVE 06/09/2018 0400   PROTEINUR 100 (A) 06/09/2018 0400   UROBILINOGEN 0.2 09/10/2013 1721   NITRITE NEGATIVE 06/09/2018 0400   LEUKOCYTESUR NEGATIVE 06/09/2018 0400   Sepsis Labs Invalid input(s): PROCALCITONIN,  WBC,  LACTICIDVEN Microbiology Recent Results (from the past 240 hour(s))  Resp Panel by RT-PCR (Flu A&B, Covid) Nasopharyngeal Swab     Status: None   Collection Time: 03/21/20 12:54 PM   Specimen: Nasopharyngeal Swab; Nasopharyngeal(NP) swabs in vial transport medium  Result Value Ref Range Status   SARS  Coronavirus 2 by RT PCR NEGATIVE NEGATIVE Final    Comment: (NOTE) SARS-CoV-2 target nucleic acids are NOT DETECTED.  The SARS-CoV-2 RNA is generally detectable in upper respiratory specimens during the acute phase of infection. The lowest concentration of SARS-CoV-2 viral copies this assay can detect is 138 copies/mL. A negative result does not preclude SARS-Cov-2 infection and should not be used as the sole basis for treatment or other patient management decisions. A negative result may occur with  improper specimen collection/handling, submission of specimen other than nasopharyngeal swab, presence of viral mutation(s) within the areas targeted by this assay, and inadequate number of viral copies(<138 copies/mL). A negative result must be combined with clinical observations, patient history, and epidemiological information. The expected result is Negative.  Fact Sheet for Patients:  EntrepreneurPulse.com.au  Fact Sheet for Healthcare Providers:  IncredibleEmployment.be  This test is no t yet approved or cleared by the [place] FDA and  has been authorized for detection and/or diagnosis of SARS-CoV-2 by FDA under an Emergency Use Authorization (EUA). This EUA will remain  in effect (meaning this test can be used) for the duration of the COVID-19 declaration under Section 564(b)(1) of the Act, 21 U.S.C.section 360bbb-3(b)(1), unless the authorization is terminated  or revoked sooner.       Influenza A by PCR NEGATIVE NEGATIVE Final   Influenza B by PCR NEGATIVE NEGATIVE Final    Comment: (NOTE) The Xpert Xpress SARS-CoV-2/FLU/RSV plus assay is intended as an aid in the diagnosis of influenza from Nasopharyngeal swab specimens and should not be used as a sole basis for treatment. Nasal washings and aspirates are unacceptable for Xpert Xpress SARS-CoV-2/FLU/RSV testing.  Fact Sheet for  Patients: EntrepreneurPulse.com.au  Fact Sheet for Healthcare Providers: IncredibleEmployment.be  This test is not yet approved or cleared by the [place] FDA and has been authorized for detection and/or diagnosis of SARS-CoV-2 by FDA under an Emergency Use Authorization (EUA). This EUA will remain in effect (meaning this test can be used) for  the duration of the COVID-19 declaration under Section 564(b)(1) of the Act, 21 U.S.C. section 360bbb-3(b)(1), unless the authorization is terminated or revoked.  Performed at [facility], [place] [address] [place], [place] [address]   Culture, blood (routine x 2)     Status: None   Collection Time: 03/21/20  2:27 PM   Specimen: BLOOD RIGHT ARM  Result Value Ref Range Status   Specimen Description BLOOD RIGHT ARM  Final   Special Requests   Final    BOTTLES DRAWN AEROBIC AND ANAEROBIC Blood Culture adequate volume   Culture   Final    NO GROWTH 5 DAYS Performed at [facility], [place] [address], [place], [place] [address]    Report Status 03/26/2020 FINAL  Final  Culture, blood (Routine X 2) w Reflex to ID Panel     Status: None (Preliminary result)   Collection Time: 03/22/20  3:01 AM   Specimen: BLOOD  Result Value Ref Range Status   Specimen Description BLOOD RIGHT ANTECUBITAL  Final   Special Requests   Final    BOTTLES DRAWN AEROBIC ONLY Blood Culture adequate volume   Culture   Final    NO GROWTH 4 DAYS Performed at [facility], [place] [address] [place], [place] [address]    Report Status PENDING  Incomplete  SARS CORONAVIRUS 2 (TAT 6-24 HRS) Nasopharyngeal Nasopharyngeal Swab     Status: None   Collection Time: 03/24/20  2:09 PM   Specimen: Nasopharyngeal Swab  Result Value Ref Range Status   SARS Coronavirus 2 NEGATIVE NEGATIVE Final    Comment: (NOTE) SARS-CoV-2 target nucleic acids are NOT DETECTED.  The SARS-CoV-2 RNA is generally detectable in upper and  lower respiratory specimens during the acute phase of infection. Negative results do not preclude SARS-CoV-2 infection, do not rule out co-infections with other pathogens, and should not be used as the sole basis for treatment or other patient management decisions. Negative results must be combined with clinical observations, patient history, and epidemiological information. The expected result is Negative.  Fact Sheet for Patients: SugarRoll.be  Fact Sheet for Healthcare Providers: https://www.woods-mathews.com/  This test is not yet approved or cleared by the [place] FDA and  has been authorized for detection and/or diagnosis of SARS-CoV-2 by FDA under an Emergency Use Authorization (EUA). This EUA will remain  in effect (meaning this test can be used) for the duration of the COVID-19 declaration under Se ction 564(b)(1) of the Act, 21 U.S.C. section 360bbb-3(b)(1), unless the authorization is terminated or revoked sooner.  Performed at [facility], [place] [address], [place], [place] [address]      Time coordinating discharge: Over 30 minutes  SIGNED:   Nolberto Hanlon, MD  [facility] 03/28/2020, 2:00 PM Pager   If 7PM-7AM, please contact night-coverage [email or web address] Password [email or web address]

## 2020-03-28 NOTE — TOC Transition Note (Signed)
Transition of Care [facility]) - CM/SW Discharge Note   Patient Details  Name: Deanna [name] MRN: [id number] Date of Birth: 1926-06-19  [facility] CM/SW Contact:  Tresa Endo Phone Number: 03/28/2020, 4:43 PM   Clinical Narrative:    Patient will DC to: [name] Anticipated DC date: 03/28/2020 Family notified: Hassan Rowan (Daughter) Transport by: Corey Harold   Per MD patient ready for DC to [name] . RN to call report prior to discharge [phone]). RN, patient, patient's family, and facility notified of DC. Discharge Summary and FL2 sent to facility. DC packet on chart. Ambulance transport requested for patient.   CSW will sign off for now as social work intervention is no longer needed. Please consult [place] again if new needs arise.        Barriers to Discharge: Continued Medical Work up   Patient Goals and CMS Choice Patient states their goals for this hospitalization and ongoing recovery are:: would like rehab before returning to independent living CMS Medicare.gov Compare Post Acute Care list provided to:: Patient Choice offered to / list presented to : [place]  Discharge Placement                       Discharge Plan and Services In-house Referral: Clinical Social Work Discharge Planning Services: CM Consult Post Acute Care Choice: [place]          DME Arranged: N/A DME Agency: NA       HH Arranged: NA HH Agency: NA        Social Determinants of Health (SDOH) Interventions     Readmission Risk Interventions No flowsheet data found.

## 2020-03-28 NOTE — Progress Notes (Signed)
Pt report was given to [place] at [facility] facility. Daughter will pick-up medication at [facility] on [address] to take to facility.

## 2020-03-28 NOTE — Progress Notes (Signed)
   03/28/20 0830  Mobility  Activity Refused mobility (Pt declined d/t anxiety)  Mobility Response RN notified

## 2020-03-29 LAB — CULTURE, BLOOD (ROUTINE X 2)
Culture: NO GROWTH
Special Requests: ADEQUATE

## 2020-04-11 DIAGNOSIS — I509 Heart failure, unspecified: Secondary | ICD-10-CM | POA: Diagnosis not present

## 2020-04-11 DIAGNOSIS — K219 Gastro-esophageal reflux disease without esophagitis: Secondary | ICD-10-CM | POA: Diagnosis not present

## 2020-04-11 DIAGNOSIS — E785 Hyperlipidemia, unspecified: Secondary | ICD-10-CM | POA: Diagnosis not present

## 2020-05-05 DIAGNOSIS — I5021 Acute systolic (congestive) heart failure: Secondary | ICD-10-CM | POA: Diagnosis not present

## 2020-05-05 DIAGNOSIS — E785 Hyperlipidemia, unspecified: Secondary | ICD-10-CM | POA: Diagnosis not present

## 2020-05-23 DIAGNOSIS — K219 Gastro-esophageal reflux disease without esophagitis: Secondary | ICD-10-CM | POA: Diagnosis not present

## 2020-05-23 DIAGNOSIS — E785 Hyperlipidemia, unspecified: Secondary | ICD-10-CM | POA: Diagnosis not present

## 2020-05-23 DIAGNOSIS — I509 Heart failure, unspecified: Secondary | ICD-10-CM | POA: Diagnosis not present

## 2020-05-30 DIAGNOSIS — Z03818 Encounter for observation for suspected exposure to other biological agents ruled out: Secondary | ICD-10-CM | POA: Diagnosis not present

## 2020-06-07 DIAGNOSIS — Z03818 Encounter for observation for suspected exposure to other biological agents ruled out: Secondary | ICD-10-CM | POA: Diagnosis not present

## 2020-06-08 ENCOUNTER — Other Ambulatory Visit: Payer: Self-pay

## 2020-06-08 ENCOUNTER — Emergency Department (HOSPITAL_COMMUNITY)
Admission: EM | Admit: 2020-06-08 | Discharge: 2020-06-09 | Disposition: A | Discharge status: Skilled Nursing Facility | Attending: Emergency Medicine | Admitting: Emergency Medicine

## 2020-06-08 DIAGNOSIS — E876 Hypokalemia: Secondary | ICD-10-CM | POA: Diagnosis not present

## 2020-06-08 DIAGNOSIS — R6889 Other general symptoms and signs: Secondary | ICD-10-CM | POA: Diagnosis not present

## 2020-06-08 DIAGNOSIS — Z79899 Other long term (current) drug therapy: Secondary | ICD-10-CM | POA: Diagnosis not present

## 2020-06-08 DIAGNOSIS — I110 Hypertensive heart disease with heart failure: Secondary | ICD-10-CM | POA: Diagnosis not present

## 2020-06-08 DIAGNOSIS — I509 Heart failure, unspecified: Secondary | ICD-10-CM | POA: Insufficient documentation

## 2020-06-08 DIAGNOSIS — Z853 Personal history of malignant neoplasm of breast: Secondary | ICD-10-CM | POA: Insufficient documentation

## 2020-06-08 DIAGNOSIS — W19XXXA Unspecified fall, initial encounter: Secondary | ICD-10-CM | POA: Diagnosis not present

## 2020-06-08 DIAGNOSIS — R410 Disorientation, unspecified: Secondary | ICD-10-CM | POA: Diagnosis present

## 2020-06-08 DIAGNOSIS — Z743 Need for continuous supervision: Secondary | ICD-10-CM | POA: Diagnosis not present

## 2020-06-08 DIAGNOSIS — I10 Essential (primary) hypertension: Secondary | ICD-10-CM

## 2020-06-08 DIAGNOSIS — R0902 Hypoxemia: Secondary | ICD-10-CM | POA: Diagnosis not present

## 2020-06-08 NOTE — ED Triage Notes (Signed)
[facility], pt "slid out of a chair" at her nursing home, did not hit her head, no LOC, pt BP 220/110 with EMS, nursing home was not able to provide a baseline.

## 2020-06-09 ENCOUNTER — Emergency Department (HOSPITAL_COMMUNITY)

## 2020-06-09 DIAGNOSIS — E876 Hypokalemia: Secondary | ICD-10-CM | POA: Diagnosis not present

## 2020-06-09 DIAGNOSIS — Z743 Need for continuous supervision: Secondary | ICD-10-CM | POA: Diagnosis not present

## 2020-06-09 DIAGNOSIS — R5381 Other malaise: Secondary | ICD-10-CM | POA: Diagnosis not present

## 2020-06-09 DIAGNOSIS — R410 Disorientation, unspecified: Secondary | ICD-10-CM | POA: Diagnosis not present

## 2020-06-09 DIAGNOSIS — I10 Essential (primary) hypertension: Secondary | ICD-10-CM | POA: Diagnosis not present

## 2020-06-09 DIAGNOSIS — R404 Transient alteration of awareness: Secondary | ICD-10-CM | POA: Diagnosis not present

## 2020-06-09 LAB — URINALYSIS, ROUTINE W REFLEX MICROSCOPIC
Bacteria, UA: NONE SEEN
Bilirubin Urine: NEGATIVE
Glucose, UA: NEGATIVE mg/dL
Hgb urine dipstick: NEGATIVE
Ketones, ur: NEGATIVE mg/dL
Nitrite: NEGATIVE
Protein, ur: >=300 mg/dL — AB
Specific Gravity, Urine: 1.02 (ref 1.005–1.030)
pH: 5 (ref 5.0–8.0)

## 2020-06-09 LAB — BASIC METABOLIC PANEL
Anion gap: 8 (ref 5–15)
BUN: 23 mg/dL (ref 8–23)
CO2: 31 mmol/L (ref 22–32)
Calcium: 9.9 mg/dL (ref 8.9–10.3)
Chloride: 98 mmol/L (ref 98–111)
Creatinine, Ser: 0.83 mg/dL (ref 0.44–1.00)
GFR, Estimated: >60 mL/min (ref >60)
Glucose, Bld: 157 mg/dL — ABNORMAL HIGH (ref 70–99)
Potassium: 2.7 mmol/L — CL (ref 3.5–5.1)
Sodium: 137 mmol/L (ref 135–145)

## 2020-06-09 LAB — CBC
HCT: 45 % (ref 36.0–46.0)
Hemoglobin: 14.5 g/dL (ref 12.0–15.0)
MCH: 29.1 pg (ref 26.0–34.0)
MCHC: 32.2 g/dL (ref 30.0–36.0)
MCV: 90.4 fL (ref 80.0–100.0)
Platelets: 273 10*3/uL (ref 150–400)
RBC: 4.98 MIL/uL (ref 3.87–5.11)
RDW: 14.5 % (ref 11.5–15.5)
WBC: 6 10*3/uL (ref 4.0–10.5)
nRBC: 0 % (ref 0.0–0.2)

## 2020-06-09 LAB — PROTIME-INR
INR: 1.1 (ref 0.8–1.2)
Prothrombin Time: 14.6 seconds (ref 11.4–15.2)

## 2020-06-09 MED ORDER — HYDRALAZINE HCL 25 MG PO TABS
100.0000 mg | ORAL_TABLET | Freq: Once | ORAL | Status: AC
  Administered 2020-06-09: 100 mg via ORAL
  Filled 2020-06-09: qty 4

## 2020-06-09 MED ORDER — LOSARTAN POTASSIUM 25 MG PO TABS
25.0000 mg | ORAL_TABLET | ORAL | Status: AC
  Administered 2020-06-09: 25 mg via ORAL
  Filled 2020-06-09: qty 1

## 2020-06-09 MED ORDER — LABETALOL HCL 5 MG/ML IV SOLN
10.0000 mg | Freq: Once | INTRAVENOUS | Status: AC
  Administered 2020-06-09: 10 mg via INTRAVENOUS
  Filled 2020-06-09: qty 4

## 2020-06-09 MED ORDER — POTASSIUM CHLORIDE CRYS ER 20 MEQ PO TBCR
40.0000 meq | EXTENDED_RELEASE_TABLET | Freq: Once | ORAL | Status: AC
  Administered 2020-06-09: 40 meq via ORAL
  Filled 2020-06-09: qty 2

## 2020-06-09 MED ORDER — AMLODIPINE BESYLATE 5 MG PO TABS
5.0000 mg | ORAL_TABLET | Freq: Once | ORAL | Status: AC
  Administered 2020-06-09: 5 mg via ORAL
  Filled 2020-06-09: qty 1

## 2020-06-09 MED ORDER — POTASSIUM CHLORIDE 10 MEQ/100ML IV SOLN
10.0000 meq | INTRAVENOUS | Status: AC
  Administered 2020-06-09 (×2): 10 meq via INTRAVENOUS
  Filled 2020-06-09 (×5): qty 100

## 2020-06-09 NOTE — ED Provider Notes (Signed)
  Physical Exam  BP (!) 191/91   Pulse 73   Temp 97.8 F (36.6 C) (Oral)   Resp 18   SpO2 99%   Physical Exam  ED Course/Procedures     Procedures  MDM   87 year old comes in a chief complaint of fall.  Patient is also seen by hospice team.  She is noted to have elevated blood pressure here.  Work-up in the ER is reassuring from trauma perspective.  Patient has had some agitation and sundowning type symptoms, urine test was sent which is not showing any evidence of infection.  She is also noted to be hypertensive, but it appears that in March admission her hypertension medications were discontinued.  I have contacted [facility] in [place] to ensure that they can manage her BP better.  We have also requested that they try to help family with her delirium.  Patient is stable for discharge. Daughter was updated by Dr. Serena Croissant, he attempted to call her again without any success prior to signing out the patient to me.       Varney Biles, MD 06/09/20 [phone]

## 2020-06-09 NOTE — ED Provider Notes (Signed)
[facility] DEPT Provider Note   CSN: [id number] Arrival date & time: 06/08/20  2248     History No chief complaint on file.   Deanna [name] is a 87 y.o. female.  Patient transferred to the emergency department from skilled nursing facility after she had a fall.  Patient reportedly slid out of a chair at the nursing home.  She did not hit her head or lose consciousness.  Patient sent to the ED for evaluation, very limited information available.  Patient seems confused, level 5 caveat due to confusion.        Past Medical History:  Diagnosis Date  . Anxiety   . Arthritis   . Atrial fibrillation ([place])   . Barrett's esophagus   . Breast cancer ([place])   . Colon polyp 2009   TUBULAR ADENOMA  . Diverticulosis of colon (without mention of hemorrhage) 2009  . GERD (gastroesophageal reflux disease)   . Hyperlipidemia   . Hypertension   . Status post dilation of esophageal narrowing   . TRICUSPID REGURGITATION 10/23/2006    Patient Active Problem List   Diagnosis Date Noted  . Encounter for hospice care discussion   . Palliative care encounter   . Acute respiratory failure with hypoxia ([place]) 03/22/2020  . Pneumonitis 03/22/2020  . Acute CHF (congestive heart failure) ([place]) 03/21/2020  . CHF (congestive heart failure) ([place]) 03/21/2020  . Hypoxia   . Hypertensive urgency 09/21/2015  . Chest pain 09/20/2015  . Hypercalcemia 05/09/2015  . Encounter for therapeutic drug monitoring 02/02/2013  . Personal history of colonic polyps 04/07/2012  . Barrett's esophagus 04/07/2012  . Osteoarthritis 02/01/2012  . URI (upper respiratory infection) 01/16/2012  . Gout 12/24/2007  . TRICUSPID REGURGITATION 10/23/2006  . ATRIAL FIBRILLATION 10/15/2006  . HYPERGLYCEMIA 10/15/2006  . Hyperlipidemia 09/23/2006  . ANXIETY 09/23/2006  . Essential hypertension 09/23/2006  . GERD 09/23/2006    Past Surgical History:  Procedure Laterality Date  . ABDOMINAL  HYSTERECTOMY    . bcc-face    . CATARACT EXTRACTION Bilateral   . MASTECTOMY Bilateral      OB History   No obstetric history on file.     Family History  Problem Relation Age of Onset  . Colon cancer Mother   . Hypertension Mother   . Colon cancer Father   . Breast cancer Other   . Colon polyps Son     Social History   Tobacco Use  . Smoking status: Never Smoker  . Smokeless tobacco: Never Used  Substance Use Topics  . Alcohol use: Yes    Alcohol/week: 5.0 standard drinks    Types: 5 drink(s) per week  . Drug use: No    Home Medications Prior to Admission medications   Medication Sig Start Date End Date Taking? Authorizing Provider  acetaminophen (TYLENOL) 325 MG tablet Take 650 mg by mouth every 6 (six) hours as needed for moderate pain.    [provider]  allopurinol (ZYLOPRIM) 100 MG tablet Take 100 mg by mouth every morning. 01/20/20   [provider]  ALPRAZolam Duanne Moron) 0.5 MG tablet Take 1 tablet (0.5 mg total) by mouth at bedtime as needed for anxiety. Patient taking differently: Take 0.5 mg by mouth at bedtime. 06/09/18   Ripley Fraise, MD  Biotin w/ Vitamins C & E (HAIR/SKIN/NAILS PO) Take 1 tablet by mouth every morning.    [provider]  carboxymethylcellul-glycerin (REFRESH OPTIVE) 0.5-0.9 % ophthalmic solution Place 1 drop into both eyes daily  as needed for dry eyes. Patient not taking: No sig reported    [provider]  Cholecalciferol (VITAMIN D) 50 MCG (2000 UT) tablet Take 2,000 Units by mouth every morning.    [provider]  fenofibrate 160 MG tablet TAKE 1 TABLET BY MOUTH  DAILY Patient taking differently: Take 160 mg by mouth at bedtime. 06/08/16   Burchette, Alinda Sierras, MD  furosemide (LASIX) 20 MG tablet Take 20 mg by mouth every morning.    [provider]  lidocaine (LIDODERM) 5 % Place 1 patch onto the skin daily. Remove & Discard patch within 12 hours or as directed by MD 03/29/20   Nolberto Hanlon, MD  Multiple Vitamin (MULTIVITAMIN WITH MINERALS) TABS tablet Take 1 tablet by mouth every morning.    [provider]  pantoprazole (PROTONIX) 40 MG tablet Take 1 tablet (40 mg total) by mouth 2 (two) times daily before a meal. 03/28/20 04/27/20  Nolberto Hanlon, MD  pravastatin (PRAVACHOL) 40 MG tablet TAKE 1 TABLET BY MOUTH  DAILY Patient taking differently: Take 40 mg by mouth at bedtime. 10/21/15   Burchette, Alinda Sierras, MD  Propylene Glycol (SYSTANE BALANCE) 0.6 % SOLN Place 1 drop into both eyes in the morning, at noon, in the evening, and at bedtime.    [provider]  sodium chloride (OCEAN) 0.65 % SOLN nasal spray Place 1 spray into both nostrils 4 (four) times daily as needed for congestion.    [provider]  sucralfate (CARAFATE) 1 g tablet Take 1 tablet (1 g total) by mouth 4 (four) times daily. 03/28/20 04/27/20  Nolberto Hanlon, MD    Allergies    Aspirin and Atorvastatin  Review of Systems   Review of Systems  Unable to perform ROS: Mental status change    Physical Exam Updated Vital Signs BP (!) 186/75   Pulse 71   Temp 97.8 F (36.6 C) (Oral)   Resp 20   SpO2 95%   Physical Exam Vitals and nursing note reviewed.  Constitutional:      General: She is not in acute distress.    Appearance: Normal appearance. She is well-developed.  HENT:     Head: Normocephalic and atraumatic.     Right Ear: Hearing normal.     Left Ear: Hearing normal.     Nose: Nose normal.  Eyes:     Conjunctiva/sclera: Conjunctivae normal.     Pupils: Pupils are equal, round, and reactive to light.  Cardiovascular:     Rate and Rhythm: Regular rhythm.     Heart sounds: S1 normal and S2 normal. No murmur heard. No friction rub. No gallop.   Pulmonary:     Effort: Pulmonary effort is normal. No respiratory distress.     Breath sounds: Normal breath sounds.  Chest:     Chest wall: No tenderness.  Abdominal:     General: Bowel sounds are normal.     Palpations:  Abdomen is soft.     Tenderness: There is no abdominal tenderness. There is no guarding or rebound. Negative signs include Murphy's sign and McBurney's sign.     Hernia: No hernia is present.  Musculoskeletal:        General: Normal range of motion.     Cervical back: Normal range of motion and neck supple.  Skin:    General: Skin is warm and dry.     Findings: No rash.  Neurological:     General: No focal deficit present.  Mental Status: She is alert.     GCS: GCS eye subscore is 4. GCS verbal subscore is 5. GCS motor subscore is 6.     Cranial Nerves: No cranial nerve deficit.     Sensory: No sensory deficit.     Coordination: Coordination normal.  Psychiatric:        Mood and Affect: Mood normal.        Speech: Speech normal.     ED Results / Procedures / Treatments   Labs (all labs ordered are listed, but only abnormal results are displayed) Labs Reviewed  BASIC METABOLIC PANEL - Abnormal; Notable for the following components:      Result Value   Potassium 2.7 (*)    Glucose, Bld 157 (*)    All other components within normal limits  CBC  PROTIME-INR  URINALYSIS, ROUTINE W REFLEX MICROSCOPIC    EKG EKG Interpretation  Date/Time:  Thursday June 09 2020 02:09:39 EDT Ventricular Rate:  60 PR Interval:    QRS Duration: 155 QT Interval:  457 QTC Calculation: 457 R Axis:   76 Text Interpretation: Atrial fibrillation Right bundle branch block No significant change since last tracing Confirmed by Orpah Greek [phone]) on 06/09/2020 5:37:11 AM   Radiology CT HEAD WO CONTRAST  Result Date: 06/09/2020 CLINICAL DATA:  Mental status change EXAM: CT HEAD WITHOUT CONTRAST TECHNIQUE: Contiguous axial images were obtained from the base of the skull through the vertex without intravenous contrast. COMPARISON:  CT brain 09/10/2013 FINDINGS: Brain: No acute territorial infarction, hemorrhage or intracranial mass. Moderate atrophy. Moderate hypodensity in the white matter  consistent with chronic small vessel ischemic change. Stable ventricle size Vascular: No hyperdense vessels.  Carotid vascular calcification Skull: Normal. Negative for fracture or focal lesion. Sinuses/Orbits: No acute finding. Other: None IMPRESSION: 1. No CT evidence for acute intracranial abnormality. 2. Atrophy and chronic small vessel ischemic change of the white matter Electronically Signed   By: Donavan Foil M.D.   On: 06/09/2020 02:36    Procedures Procedures   Medications Ordered in ED Medications  potassium chloride 10 mEq in 100 mL IVPB (10 mEq Intravenous New Bag/Given 06/09/20 0546)  hydrALAZINE (APRESOLINE) tablet 100 mg (has no administration in time range)  labetalol (NORMODYNE) injection 10 mg (10 mg Intravenous Given 06/09/20 0152)  potassium chloride SA (KLOR-CON) CR tablet 40 mEq (40 mEq Oral Given 06/09/20 0549)    ED Course  I have reviewed the triage vital signs and the nursing notes.  Pertinent labs & imaging results that were available during my care of the patient were reviewed by me and considered in my medical decision making (see chart for details).    MDM Rules/Calculators/A&P                          Patient sent to the emergency department after a minor fall.  Patient reportedly slipped out of a chair and landed on the ground.  She is without complaints at arrival does not appear to have any injury.  She does take Coumadin and seems slightly confused, therefore CT head was performed.  No injury was noted.  Patient noted to be hypertensive.  Reviewing her records reveal she did have a recent hospitalization during which time her meds were adjusted.  When she was admitted to the hospital she was on Cozaar, hydralazine, Norvasc.  It appears that she was weaned off of all of her blood pressure medications during the hospitalization.  Blood pressures were normal at discharge but are now very high.  She was given IV labetalol, p.o. hydralazine here in the department.   Will likely need at least some of these medications restarted at the nursing home.  Patient found to be hypokalemic.  Given oral and IV replacement.  I did contact the patient's daughter, Hassan Rowan at home.  Hassan Rowan confirms that the patient has been confused for the last 3 weeks or so.  Patient left the hospital and went to skilled nursing facility under hospice care recently.  Prior to that she was in a retirement community.  This change of living arrangement might explain the patient's confusion.  CT head does not show obvious abnormality.  Urinalysis does not show obvious infection.  There does not appear to be any clear etiology for the confusion.   Patient is currently in hospice care.  She is in no distress.  I do not see any indication for hospitalization at this time.  Final Clinical Impression(s) / ED Diagnoses Final diagnoses:  Hypokalemia  Primary hypertension    Rx / DC Orders ED Discharge Orders    None       Amanpreet Delmont, Gwenyth Allegra, MD 06/09/20 [phone]

## 2020-06-09 NOTE — ED Notes (Signed)
Called [facility] to arrange transportation back to SNF

## 2020-06-09 NOTE — ED Notes (Signed)
Pt was attempting to get out of bed, pt instructed to stay in bed and that transport would be coming to take her home in the morning. Pt continued to attempt to leave. Posey belt applied. Bed alarm on.

## 2020-06-10 LAB — URINE CULTURE

## 2020-06-13 DIAGNOSIS — Z03818 Encounter for observation for suspected exposure to other biological agents ruled out: Secondary | ICD-10-CM | POA: Diagnosis not present

## 2020-06-15 ENCOUNTER — Emergency Department (HOSPITAL_COMMUNITY)

## 2020-06-15 ENCOUNTER — Inpatient Hospital Stay (HOSPITAL_COMMUNITY)
Admission: EM | Admit: 2020-06-15 | Discharge: 2020-07-08 | DRG: 640 | Disposition: E | Source: Skilled Nursing Facility | Attending: Family Medicine | Admitting: Family Medicine

## 2020-06-15 ENCOUNTER — Encounter (HOSPITAL_COMMUNITY): Payer: Self-pay | Admitting: Emergency Medicine

## 2020-06-15 ENCOUNTER — Other Ambulatory Visit: Payer: Self-pay

## 2020-06-15 DIAGNOSIS — R410 Disorientation, unspecified: Secondary | ICD-10-CM

## 2020-06-15 DIAGNOSIS — Z9013 Acquired absence of bilateral breasts and nipples: Secondary | ICD-10-CM

## 2020-06-15 DIAGNOSIS — G9341 Metabolic encephalopathy: Secondary | ICD-10-CM | POA: Diagnosis present

## 2020-06-15 DIAGNOSIS — Z90710 Acquired absence of both cervix and uterus: Secondary | ICD-10-CM

## 2020-06-15 DIAGNOSIS — E860 Dehydration: Secondary | ICD-10-CM | POA: Diagnosis not present

## 2020-06-15 DIAGNOSIS — E876 Hypokalemia: Secondary | ICD-10-CM | POA: Diagnosis present

## 2020-06-15 DIAGNOSIS — I110 Hypertensive heart disease with heart failure: Secondary | ICD-10-CM | POA: Diagnosis present

## 2020-06-15 DIAGNOSIS — Z781 Physical restraint status: Secondary | ICD-10-CM

## 2020-06-15 DIAGNOSIS — Z853 Personal history of malignant neoplasm of breast: Secondary | ICD-10-CM

## 2020-06-15 DIAGNOSIS — Z515 Encounter for palliative care: Secondary | ICD-10-CM

## 2020-06-15 DIAGNOSIS — Z8249 Family history of ischemic heart disease and other diseases of the circulatory system: Secondary | ICD-10-CM

## 2020-06-15 DIAGNOSIS — Z85828 Personal history of other malignant neoplasm of skin: Secondary | ICD-10-CM

## 2020-06-15 DIAGNOSIS — F419 Anxiety disorder, unspecified: Secondary | ICD-10-CM | POA: Diagnosis present

## 2020-06-15 DIAGNOSIS — I4891 Unspecified atrial fibrillation: Secondary | ICD-10-CM | POA: Diagnosis not present

## 2020-06-15 DIAGNOSIS — Z20822 Contact with and (suspected) exposure to covid-19: Secondary | ICD-10-CM | POA: Diagnosis present

## 2020-06-15 DIAGNOSIS — N390 Urinary tract infection, site not specified: Secondary | ICD-10-CM | POA: Diagnosis present

## 2020-06-15 DIAGNOSIS — Z66 Do not resuscitate: Secondary | ICD-10-CM | POA: Diagnosis present

## 2020-06-15 DIAGNOSIS — R440 Auditory hallucinations: Secondary | ICD-10-CM | POA: Diagnosis present

## 2020-06-15 DIAGNOSIS — Z743 Need for continuous supervision: Secondary | ICD-10-CM | POA: Diagnosis not present

## 2020-06-15 DIAGNOSIS — K219 Gastro-esophageal reflux disease without esophagitis: Secondary | ICD-10-CM | POA: Diagnosis present

## 2020-06-15 DIAGNOSIS — Z79899 Other long term (current) drug therapy: Secondary | ICD-10-CM

## 2020-06-15 DIAGNOSIS — E785 Hyperlipidemia, unspecified: Secondary | ICD-10-CM | POA: Diagnosis present

## 2020-06-15 DIAGNOSIS — I50810 Right heart failure, unspecified: Secondary | ICD-10-CM | POA: Diagnosis present

## 2020-06-15 DIAGNOSIS — Z803 Family history of malignant neoplasm of breast: Secondary | ICD-10-CM

## 2020-06-15 DIAGNOSIS — Z800 Family history of malignant neoplasm of digestive organs: Secondary | ICD-10-CM

## 2020-06-15 DIAGNOSIS — R441 Visual hallucinations: Secondary | ICD-10-CM | POA: Diagnosis present

## 2020-06-15 DIAGNOSIS — R17 Unspecified jaundice: Secondary | ICD-10-CM | POA: Diagnosis present

## 2020-06-15 DIAGNOSIS — R4182 Altered mental status, unspecified: Secondary | ICD-10-CM | POA: Diagnosis present

## 2020-06-15 LAB — URINALYSIS, COMPLETE (UACMP) WITH MICROSCOPIC
Bilirubin Urine: NEGATIVE
Glucose, UA: NEGATIVE mg/dL
Hgb urine dipstick: NEGATIVE
Ketones, ur: NEGATIVE mg/dL
Nitrite: NEGATIVE
Protein, ur: 100 mg/dL — AB
Specific Gravity, Urine: 1.017 (ref 1.005–1.030)
pH: 5 (ref 5.0–8.0)

## 2020-06-15 LAB — RAPID URINE DRUG SCREEN, HOSP PERFORMED
Amphetamines: NOT DETECTED
Barbiturates: NOT DETECTED
Benzodiazepines: POSITIVE — AB
Cocaine: NOT DETECTED
Opiates: POSITIVE — AB
Tetrahydrocannabinol: NOT DETECTED

## 2020-06-15 LAB — COMPREHENSIVE METABOLIC PANEL
ALT: 28 U/L (ref 0–44)
AST: 40 U/L (ref 15–41)
Albumin: 3.7 g/dL (ref 3.5–5.0)
Alkaline Phosphatase: 59 U/L (ref 38–126)
Anion gap: 10 (ref 5–15)
BUN: 28 mg/dL — ABNORMAL HIGH (ref 8–23)
CO2: 30 mmol/L (ref 22–32)
Calcium: 10.6 mg/dL — ABNORMAL HIGH (ref 8.9–10.3)
Chloride: 94 mmol/L — ABNORMAL LOW (ref 98–111)
Creatinine, Ser: 1.04 mg/dL — ABNORMAL HIGH (ref 0.44–1.00)
GFR, Estimated: 51 mL/min — ABNORMAL LOW (ref >60)
Glucose, Bld: 115 mg/dL — ABNORMAL HIGH (ref 70–99)
Potassium: 3.1 mmol/L — ABNORMAL LOW (ref 3.5–5.1)
Sodium: 134 mmol/L — ABNORMAL LOW (ref 135–145)
Total Bilirubin: 2.1 mg/dL — ABNORMAL HIGH (ref 0.3–1.2)
Total Protein: 7 g/dL (ref 6.5–8.1)

## 2020-06-15 LAB — CBC WITH DIFFERENTIAL/PLATELET
Abs Immature Granulocytes: 0.03 10*3/uL (ref 0.00–0.07)
Basophils Absolute: 0.1 10*3/uL (ref 0.0–0.1)
Basophils Relative: 1 %
Eosinophils Absolute: 0.1 10*3/uL (ref 0.0–0.5)
Eosinophils Relative: 2 %
HCT: 48.9 % — ABNORMAL HIGH (ref 36.0–46.0)
Hemoglobin: 15.7 g/dL — ABNORMAL HIGH (ref 12.0–15.0)
Immature Granulocytes: 1 %
Lymphocytes Relative: 19 %
Lymphs Abs: 1.2 10*3/uL (ref 0.7–4.0)
MCH: 29 pg (ref 26.0–34.0)
MCHC: 32.1 g/dL (ref 30.0–36.0)
MCV: 90.2 fL (ref 80.0–100.0)
Monocytes Absolute: 0.9 10*3/uL (ref 0.1–1.0)
Monocytes Relative: 15 %
Neutro Abs: 4 10*3/uL (ref 1.7–7.7)
Neutrophils Relative %: 62 %
Platelets: 311 10*3/uL (ref 150–400)
RBC: 5.42 MIL/uL — ABNORMAL HIGH (ref 3.87–5.11)
RDW: 14.5 % (ref 11.5–15.5)
WBC: 6.3 10*3/uL (ref 4.0–10.5)
nRBC: 0 % (ref 0.0–0.2)

## 2020-06-15 LAB — BLOOD GAS, VENOUS
Acid-Base Excess: 5.3 mmol/L — ABNORMAL HIGH (ref 0.0–2.0)
Bicarbonate: 31.3 mmol/L — ABNORMAL HIGH (ref 20.0–28.0)
FIO2: 21
O2 Saturation: 64.4 %
Patient temperature: 98.6
pCO2, Ven: 53 mmHg (ref 44.0–60.0)
pH, Ven: 7.388 (ref 7.250–7.430)
pO2, Ven: 36.2 mmHg (ref 32.0–45.0)

## 2020-06-15 LAB — PROTIME-INR
INR: 1.1 (ref 0.8–1.2)
Prothrombin Time: 14.2 seconds (ref 11.4–15.2)

## 2020-06-15 LAB — AMMONIA: Ammonia: 19 umol/L (ref 9–35)

## 2020-06-15 LAB — BILIRUBIN, FRACTIONATED(TOT/DIR/INDIR)
Bilirubin, Direct: 0.8 mg/dL — ABNORMAL HIGH (ref 0.0–0.2)
Indirect Bilirubin: 1 mg/dL — ABNORMAL HIGH (ref 0.3–0.9)
Total Bilirubin: 1.8 mg/dL — ABNORMAL HIGH (ref 0.3–1.2)

## 2020-06-15 LAB — ETHANOL: Alcohol, Ethyl (B): <10 mg/dL (ref <10)

## 2020-06-15 LAB — RESP PANEL BY RT-PCR (FLU A&B, COVID) ARPGX2
Influenza A by PCR: NEGATIVE
Influenza B by PCR: NEGATIVE
SARS Coronavirus 2 by RT PCR: NEGATIVE

## 2020-06-15 LAB — CBG MONITORING, ED: Glucose-Capillary: 115 mg/dL — ABNORMAL HIGH (ref 70–99)

## 2020-06-15 LAB — MAGNESIUM: Magnesium: 1.8 mg/dL (ref 1.7–2.4)

## 2020-06-15 LAB — LACTIC ACID, PLASMA: Lactic Acid, Venous: 0.9 mmol/L (ref 0.5–1.9)

## 2020-06-15 LAB — BRAIN NATRIURETIC PEPTIDE: B Natriuretic Peptide: 69.1 pg/mL (ref 0.0–100.0)

## 2020-06-15 MED ORDER — SODIUM CHLORIDE 0.9 % IV SOLN
1.0000 g | INTRAVENOUS | Status: DC
  Filled 2020-06-15: qty 10

## 2020-06-15 MED ORDER — AMLODIPINE BESYLATE 5 MG PO TABS
5.0000 mg | ORAL_TABLET | Freq: Every day | ORAL | Status: DC
  Administered 2020-06-16: 5 mg via ORAL
  Filled 2020-06-15: qty 1

## 2020-06-15 MED ORDER — FENOFIBRATE 160 MG PO TABS
160.0000 mg | ORAL_TABLET | Freq: Every day | ORAL | Status: DC
  Administered 2020-06-16: 160 mg via ORAL
  Filled 2020-06-15: qty 1

## 2020-06-15 MED ORDER — POTASSIUM CHLORIDE CRYS ER 20 MEQ PO TBCR
40.0000 meq | EXTENDED_RELEASE_TABLET | Freq: Every day | ORAL | Status: DC
  Administered 2020-06-15 – 2020-06-16 (×2): 40 meq via ORAL
  Filled 2020-06-15 (×2): qty 2

## 2020-06-15 MED ORDER — POLYVINYL ALCOHOL 1.4 % OP SOLN
1.0000 [drp] | Freq: Four times a day (QID) | OPHTHALMIC | Status: DC
  Administered 2020-06-15 – 2020-06-16 (×3): 1 [drp] via OPHTHALMIC
  Filled 2020-06-15: qty 15

## 2020-06-15 MED ORDER — DULOXETINE HCL 30 MG PO CPEP
30.0000 mg | ORAL_CAPSULE | Freq: Every day | ORAL | Status: DC
  Administered 2020-06-15: 30 mg via ORAL
  Filled 2020-06-15: qty 1

## 2020-06-15 MED ORDER — CARBOXYMETHYLCELLUL-GLYCERIN 0.5-0.9 % OP SOLN: 1.0000 [drp] | Freq: Every day | OPHTHALMIC | Status: DC | PRN

## 2020-06-15 MED ORDER — LACTATED RINGERS IV BOLUS
1000.0000 mL | Freq: Once | INTRAVENOUS | Status: AC
  Administered 2020-06-15: 1000 mL via INTRAVENOUS

## 2020-06-15 MED ORDER — VITAMIN D 25 MCG (1000 UNIT) PO TABS
2000.0000 [IU] | ORAL_TABLET | Freq: Every morning | ORAL | Status: DC
  Administered 2020-06-16: 2000 [IU] via ORAL

## 2020-06-15 MED ORDER — PANTOPRAZOLE SODIUM 40 MG PO TBEC
40.0000 mg | DELAYED_RELEASE_TABLET | Freq: Two times a day (BID) | ORAL | Status: DC
  Administered 2020-06-15 – 2020-06-16 (×2): 40 mg via ORAL
  Filled 2020-06-15 (×2): qty 1

## 2020-06-15 MED ORDER — ONDANSETRON HCL 4 MG/2ML IJ SOLN: 4.0000 mg | Freq: Four times a day (QID) | INTRAMUSCULAR | Status: DC | PRN

## 2020-06-15 MED ORDER — LORATADINE 10 MG PO TABS
10.0000 mg | ORAL_TABLET | Freq: Every day | ORAL | Status: DC
  Administered 2020-06-16: 10 mg via ORAL
  Filled 2020-06-15: qty 1

## 2020-06-15 MED ORDER — PROPYLENE GLYCOL 0.6 % OP SOLN: 1.0000 [drp] | Freq: Four times a day (QID) | OPHTHALMIC | Status: DC

## 2020-06-15 MED ORDER — SODIUM CHLORIDE 0.9 % IV SOLN
1.0000 g | Freq: Once | INTRAVENOUS | Status: AC
  Administered 2020-06-15: 1 g via INTRAVENOUS
  Filled 2020-06-15: qty 10

## 2020-06-15 MED ORDER — SODIUM CHLORIDE 0.9 % IV SOLN: Freq: Once | INTRAVENOUS | Status: AC

## 2020-06-15 MED ORDER — POTASSIUM CHLORIDE 10 MEQ/100ML IV SOLN
10.0000 meq | INTRAVENOUS | Status: AC
  Administered 2020-06-15 (×2): 10 meq via INTRAVENOUS
  Filled 2020-06-15 (×2): qty 100

## 2020-06-15 MED ORDER — FUROSEMIDE 20 MG PO TABS: 20.0000 mg | ORAL_TABLET | Freq: Every morning | ORAL | Status: DC

## 2020-06-15 MED ORDER — ALLOPURINOL 100 MG PO TABS
100.0000 mg | ORAL_TABLET | Freq: Every morning | ORAL | Status: DC
  Administered 2020-06-16: 100 mg via ORAL
  Filled 2020-06-15: qty 1

## 2020-06-15 MED ORDER — ONDANSETRON HCL 4 MG PO TABS: 4.0000 mg | ORAL_TABLET | Freq: Four times a day (QID) | ORAL | Status: DC | PRN

## 2020-06-15 MED ORDER — ADULT MULTIVITAMIN W/MINERALS CH
1.0000 | ORAL_TABLET | Freq: Every morning | ORAL | Status: DC
  Administered 2020-06-16: 1 via ORAL

## 2020-06-15 MED ORDER — ACETAMINOPHEN 650 MG RE SUPP: 650.0000 mg | Freq: Four times a day (QID) | RECTAL | Status: DC | PRN

## 2020-06-15 MED ORDER — SALINE SPRAY 0.65 % NA SOLN
1.0000 | Freq: Four times a day (QID) | NASAL | Status: DC | PRN
  Filled 2020-06-15: qty 44

## 2020-06-15 MED ORDER — SUCRALFATE 1 G PO TABS
1.0000 g | ORAL_TABLET | Freq: Three times a day (TID) | ORAL | Status: DC
  Administered 2020-06-15 – 2020-06-16 (×2): 1 g via ORAL
  Filled 2020-06-15 (×2): qty 1

## 2020-06-15 MED ORDER — ACETAMINOPHEN 325 MG PO TABS
650.0000 mg | ORAL_TABLET | Freq: Four times a day (QID) | ORAL | Status: DC | PRN
  Administered 2020-06-16: 650 mg via ORAL
  Filled 2020-06-15: qty 2

## 2020-06-15 MED ORDER — HYDRALAZINE HCL 20 MG/ML IJ SOLN
10.0000 mg | Freq: Three times a day (TID) | INTRAMUSCULAR | Status: DC | PRN
  Administered 2020-06-15 – 2020-06-16 (×2): 10 mg via INTRAVENOUS
  Filled 2020-06-15 (×2): qty 1

## 2020-06-15 NOTE — ED Notes (Signed)
Bed alarm placed for pt's safety.  
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
## 2020-06-15 NOTE — ED Notes (Signed)
Patient being taken up by tech  
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
## 2020-06-16 DIAGNOSIS — R440 Auditory hallucinations: Secondary | ICD-10-CM | POA: Diagnosis present

## 2020-06-16 DIAGNOSIS — E860 Dehydration: Secondary | ICD-10-CM | POA: Diagnosis present

## 2020-06-16 DIAGNOSIS — Z66 Do not resuscitate: Secondary | ICD-10-CM | POA: Diagnosis present

## 2020-06-16 DIAGNOSIS — Z85828 Personal history of other malignant neoplasm of skin: Secondary | ICD-10-CM | POA: Diagnosis not present

## 2020-06-16 DIAGNOSIS — I50810 Right heart failure, unspecified: Secondary | ICD-10-CM | POA: Diagnosis present

## 2020-06-16 DIAGNOSIS — I110 Hypertensive heart disease with heart failure: Secondary | ICD-10-CM | POA: Diagnosis present

## 2020-06-16 DIAGNOSIS — Z515 Encounter for palliative care: Secondary | ICD-10-CM | POA: Diagnosis not present

## 2020-06-16 DIAGNOSIS — N390 Urinary tract infection, site not specified: Secondary | ICD-10-CM | POA: Diagnosis present

## 2020-06-16 DIAGNOSIS — I4891 Unspecified atrial fibrillation: Secondary | ICD-10-CM | POA: Diagnosis present

## 2020-06-16 DIAGNOSIS — Z853 Personal history of malignant neoplasm of breast: Secondary | ICD-10-CM | POA: Diagnosis not present

## 2020-06-16 DIAGNOSIS — E785 Hyperlipidemia, unspecified: Secondary | ICD-10-CM | POA: Diagnosis present

## 2020-06-16 DIAGNOSIS — G9341 Metabolic encephalopathy: Secondary | ICD-10-CM | POA: Diagnosis present

## 2020-06-16 DIAGNOSIS — Z90710 Acquired absence of both cervix and uterus: Secondary | ICD-10-CM | POA: Diagnosis not present

## 2020-06-16 DIAGNOSIS — F419 Anxiety disorder, unspecified: Secondary | ICD-10-CM | POA: Diagnosis present

## 2020-06-16 DIAGNOSIS — K219 Gastro-esophageal reflux disease without esophagitis: Secondary | ICD-10-CM | POA: Diagnosis present

## 2020-06-16 DIAGNOSIS — Z781 Physical restraint status: Secondary | ICD-10-CM | POA: Diagnosis not present

## 2020-06-16 DIAGNOSIS — Z20822 Contact with and (suspected) exposure to covid-19: Secondary | ICD-10-CM | POA: Diagnosis present

## 2020-06-16 DIAGNOSIS — Z803 Family history of malignant neoplasm of breast: Secondary | ICD-10-CM | POA: Diagnosis not present

## 2020-06-16 DIAGNOSIS — E876 Hypokalemia: Secondary | ICD-10-CM | POA: Diagnosis present

## 2020-06-16 DIAGNOSIS — R410 Disorientation, unspecified: Secondary | ICD-10-CM | POA: Diagnosis present

## 2020-06-16 DIAGNOSIS — Z8249 Family history of ischemic heart disease and other diseases of the circulatory system: Secondary | ICD-10-CM | POA: Diagnosis not present

## 2020-06-16 DIAGNOSIS — Z9013 Acquired absence of bilateral breasts and nipples: Secondary | ICD-10-CM | POA: Diagnosis not present

## 2020-06-16 DIAGNOSIS — R17 Unspecified jaundice: Secondary | ICD-10-CM | POA: Diagnosis present

## 2020-06-16 DIAGNOSIS — Z800 Family history of malignant neoplasm of digestive organs: Secondary | ICD-10-CM | POA: Diagnosis not present

## 2020-06-16 LAB — COMPREHENSIVE METABOLIC PANEL
ALT: 29 U/L (ref 0–44)
AST: 44 U/L — ABNORMAL HIGH (ref 15–41)
Albumin: 3.5 g/dL (ref 3.5–5.0)
Alkaline Phosphatase: 53 U/L (ref 38–126)
Anion gap: 11 (ref 5–15)
BUN: 17 mg/dL (ref 8–23)
CO2: 26 mmol/L (ref 22–32)
Calcium: 10.5 mg/dL — ABNORMAL HIGH (ref 8.9–10.3)
Chloride: 102 mmol/L (ref 98–111)
Creatinine, Ser: 0.73 mg/dL (ref 0.44–1.00)
GFR, Estimated: >60 mL/min (ref >60)
Glucose, Bld: 136 mg/dL — ABNORMAL HIGH (ref 70–99)
Potassium: 3.5 mmol/L (ref 3.5–5.1)
Sodium: 139 mmol/L (ref 135–145)
Total Bilirubin: 2 mg/dL — ABNORMAL HIGH (ref 0.3–1.2)
Total Protein: 6.8 g/dL (ref 6.5–8.1)

## 2020-06-16 LAB — CBC
HCT: 46 % (ref 36.0–46.0)
Hemoglobin: 15 g/dL (ref 12.0–15.0)
MCH: 29.2 pg (ref 26.0–34.0)
MCHC: 32.6 g/dL (ref 30.0–36.0)
MCV: 89.7 fL (ref 80.0–100.0)
Platelets: 295 10*3/uL (ref 150–400)
RBC: 5.13 MIL/uL — ABNORMAL HIGH (ref 3.87–5.11)
RDW: 14.6 % (ref 11.5–15.5)
WBC: 7 10*3/uL (ref 4.0–10.5)
nRBC: 0 % (ref 0.0–0.2)

## 2020-06-16 LAB — URINE CULTURE

## 2020-06-16 MED ORDER — POLYVINYL ALCOHOL 1.4 % OP SOLN
1.0000 [drp] | Freq: Four times a day (QID) | OPHTHALMIC | Status: DC | PRN
  Filled 2020-06-16: qty 15

## 2020-06-16 MED ORDER — GLYCOPYRROLATE 1 MG PO TABS: 1.0000 mg | ORAL_TABLET | ORAL | Status: DC | PRN

## 2020-06-16 MED ORDER — ONDANSETRON 4 MG PO TBDP: 4.0000 mg | ORAL_TABLET | Freq: Four times a day (QID) | ORAL | Status: DC | PRN

## 2020-06-16 MED ORDER — ONDANSETRON HCL 4 MG/2ML IJ SOLN: 4.0000 mg | Freq: Four times a day (QID) | INTRAMUSCULAR | Status: DC | PRN

## 2020-06-16 MED ORDER — ACETAMINOPHEN 650 MG RE SUPP: 650.0000 mg | Freq: Four times a day (QID) | RECTAL | Status: DC | PRN

## 2020-06-16 MED ORDER — HALOPERIDOL LACTATE 2 MG/ML PO CONC
0.5000 mg | ORAL | Status: DC | PRN
  Filled 2020-06-16: qty 0.3

## 2020-06-16 MED ORDER — HALOPERIDOL LACTATE 5 MG/ML IJ SOLN: 0.5000 mg | INTRAMUSCULAR | Status: DC | PRN

## 2020-06-16 MED ORDER — GLYCOPYRROLATE 0.2 MG/ML IJ SOLN: 0.2000 mg | INTRAMUSCULAR | Status: DC | PRN

## 2020-06-16 MED ORDER — BIOTENE DRY MOUTH MT LIQD: 15.0000 mL | OROMUCOSAL | Status: DC | PRN

## 2020-06-16 MED ORDER — LORAZEPAM 2 MG/ML IJ SOLN
1.0000 mg | INTRAMUSCULAR | Status: DC
  Administered 2020-06-16 – 2020-06-22 (×37): 1 mg via INTRAVENOUS
  Filled 2020-06-16 (×38): qty 1

## 2020-06-16 MED ORDER — MORPHINE SULFATE (PF) 2 MG/ML IV SOLN
2.0000 mg | INTRAVENOUS | Status: DC | PRN
  Administered 2020-06-18 – 2020-06-22 (×8): 2 mg via INTRAVENOUS
  Filled 2020-06-16 (×8): qty 1

## 2020-06-16 MED ORDER — ACETAMINOPHEN 325 MG PO TABS
650.0000 mg | ORAL_TABLET | Freq: Four times a day (QID) | ORAL | Status: DC | PRN
Start: 2020-06-16 — End: 2020-06-23

## 2020-06-16 MED ORDER — MORPHINE SULFATE (PF) 2 MG/ML IV SOLN
2.0000 mg | INTRAVENOUS | Status: DC | PRN
  Administered 2020-06-16: 2 mg via INTRAVENOUS
  Filled 2020-06-16: qty 1

## 2020-06-16 MED ORDER — LORAZEPAM 2 MG/ML IJ SOLN: 1.0000 mg | INTRAMUSCULAR | Status: DC

## 2020-06-16 MED ORDER — HALOPERIDOL 0.5 MG PO TABS: 0.5000 mg | ORAL_TABLET | ORAL | Status: DC | PRN

## 2020-06-16 NOTE — Progress Notes (Signed)
PROGRESS NOTE    Deanna [name]  GQQ:[id number] DOB: May 06, 1926 DOA: 06/23/2020 PCP: Lajean Manes, MD   Brief Narrative: 87 year old female from assisted living facility with history of hyperlipidemia hypertension and GERD admitted with altered mental status.  Patient was on hospice prior to admission.  In the ED she was noted to be clinically dehydrated with hypokalemia.  Assessment & Plan:   Active Problems:   AMS (altered mental status)  #1 acute encephalopathy secondary to multifactorial etiology with UTI, hypercalcemia, dehydration, hypokalemia. Had family meeting with the patient's daughter and son.  Her daughter is the power of attorney.  They do not want their mother to suffer and go in and out of the hospital without any benefits for her.  They would like their mother to be comfortable without any IVs or monitoring or telemetry boxes. I agree this was the best decision the family could have made for this patient.  After discussing with the family I have placed the patient on comfort care with no further lab works  Patient's CODE STATUS is DO NOT RESUSCITATE She is comfort care as of 06/16/2020. Ativan morphine ordered.   Estimated body mass index is 27.53 kg/m as calculated from the following:   Height as of 03/21/20: 5\' 2"  (1.575 m).   Weight as of 03/27/20: 68.3 kg.  DVT prophylaxis: None comfort care  code Status: dnr Family Communication: Discussed with patient's daughter and son at bedside  disposition Plan:  Status is: Inpatient  Remains inpatient appropriate because:Altered mental status and IV treatments appropriate due to intensity of illness or inability to take PO  Dispo: The patient is from:  AL              Anticipated d/c is to: SNF              Patient currently is not medically stable to d/c.   Difficult to place patient No       Consultants:  NONE  Procedures: NONE Antimicrobials: NONE  Subjective:  Patient laying sideways in bed hand mittens  in place trying to get out of mittens  Objective: Vitals:   06/14/2020 2235 06/16/20 0231 06/16/20 0637 06/16/20 1312  BP: (!) 162/76 (!) 198/68 (!) 198/77 (!) 198/69  Pulse: 74 74 77 79  Resp: 18 18 18 18   Temp: 98.1 F (36.7 C) 98.2 F (36.8 C) 98.6 F (37 C)   TempSrc: Oral Oral Oral Oral  SpO2: 96% 95% 98% 94%    Intake/Output Summary (Last 24 hours) at 06/16/2020 1446 Last data filed at 06/16/2020 1100 Gross per 24 hour  Intake 160 ml  Output --  Net 160 ml   There were no vitals filed for this visit.  Examination:  General exam: Appears in distress restless delirious hallucinating Respiratory system: Clear to auscultation. Respiratory effort normal. Cardiovascular system: S1 & S2 heard, RRR. No JVD, murmurs, rubs, gallops or clicks. No pedal edema. Gastrointestinal system: Abdomen is nondistended, soft and nontender. No organomegaly or masses felt. Normal bowel sounds heard. Central nervous system: Confused delirious  extremities: Moves all extremities. Skin: No rashes, lesions or ulcers Psychiatry: Impaired judgment   Data Reviewed: I have personally reviewed following labs and imaging studies  CBC: Recent Labs  Lab 06/16/2020 1202 06/16/20 0552  WBC 6.3 7.0  NEUTROABS 4.0  --   HGB 15.7* 15.0  HCT 48.9* 46.0  MCV 90.2 89.7  PLT 311 671   Basic Metabolic Panel: Recent Labs  Lab 06/12/2020 1202 06/16/20  0552  NA 134* 139  K 3.1* 3.5  CL 94* 102  CO2 30 26  GLUCOSE 115* 136*  BUN 28* 17  CREATININE 1.04* 0.73  CALCIUM 10.6* 10.5*  MG 1.8  --    GFR: CrCl cannot be calculated (Unknown ideal weight.). Liver Function Tests: Recent Labs  Lab 06/28/2020 1202 06/19/2020 1732 06/16/20 0552  AST 40  --  44*  ALT 28  --  29  ALKPHOS 59  --  53  BILITOT 2.1* 1.8* 2.0*  PROT 7.0  --  6.8  ALBUMIN 3.7  --  3.5   No results for input(s): LIPASE, AMYLASE in the last 168 hours. Recent Labs  Lab 07/05/2020 1202  AMMONIA 19   Coagulation Profile: Recent  Labs  Lab 06/18/2020 1202  INR 1.1   Cardiac Enzymes: No results for input(s): CKTOTAL, CKMB, CKMBINDEX, TROPONINI in the last 168 hours. BNP (last 3 results) No results for input(s): PROBNP in the last 8760 hours. HbA1C: No results for input(s): HGBA1C in the last 72 hours. CBG: Recent Labs  Lab 06/24/2020 1205  GLUCAP 115*   Lipid Profile: No results for input(s): CHOL, HDL, LDLCALC, TRIG, CHOLHDL, LDLDIRECT in the last 72 hours. Thyroid Function Tests: No results for input(s): TSH, T4TOTAL, FREET4, T3FREE, THYROIDAB in the last 72 hours. Anemia Panel: No results for input(s): VITAMINB12, FOLATE, FERRITIN, TIBC, IRON, RETICCTPCT in the last 72 hours. Sepsis Labs: Recent Labs  Lab 06/18/2020 1202  LATICACIDVEN 0.9    Recent Results (from the past 240 hour(s))  Urine Culture     Status: Abnormal   Collection Time: 06/09/20  6:28 AM   Specimen: Urine, Random  Result Value Ref Range Status   Specimen Description   Final    URINE, RANDOM Performed at [facility] [address], [place], [place] [address]    Special Requests   Final    NONE Performed at [facility], [place] [address] [place], [place] [address]    Culture MULTIPLE SPECIES PRESENT, SUGGEST RECOLLECTION (A)  Final   Report Status 06/10/2020 FINAL  Final  Urine culture     Status: Abnormal   Collection Time: 06/27/2020 12:02 PM   Specimen: Urine, Random  Result Value Ref Range Status   Specimen Description   Final    URINE, RANDOM Performed at [facility] [address] [place], [place] [address]    Special Requests   Final    NONE Performed at [facility], [place] [address], [place], [place] [address]    Culture MULTIPLE SPECIES PRESENT, SUGGEST RECOLLECTION (A)  Final   Report Status 06/16/2020 FINAL  Final  Resp Panel by RT-PCR (Flu A&B, Covid) Nasopharyngeal Swab     Status: None   Collection Time: 06/13/2020 12:48 PM    Specimen: Nasopharyngeal Swab; Nasopharyngeal(NP) swabs in vial transport medium  Result Value Ref Range Status   SARS Coronavirus 2 by RT PCR NEGATIVE NEGATIVE Final    Comment: (NOTE) SARS-CoV-2 target nucleic acids are NOT DETECTED.  The SARS-CoV-2 RNA is generally detectable in upper respiratory specimens during the acute phase of infection. The lowest concentration of SARS-CoV-2 viral copies this assay can detect is 138 copies/mL. A negative result does not preclude SARS-Cov-2 infection and should not be used as the sole basis for treatment or other patient management decisions. A negative result may occur with  improper specimen collection/handling, submission of specimen other than nasopharyngeal swab, presence of viral mutation(s) within  the areas targeted by this assay, and inadequate number of viral copies(<138 copies/mL). A negative result must be combined with clinical observations, patient history, and epidemiological information. The expected result is Negative.  Fact Sheet for Patients:  EntrepreneurPulse.com.au  Fact Sheet for Healthcare Providers:  IncredibleEmployment.be  This test is no t yet approved or cleared by the [place] FDA and  has been authorized for detection and/or diagnosis of SARS-CoV-2 by FDA under an Emergency Use Authorization (EUA). This EUA will remain  in effect (meaning this test can be used) for the duration of the COVID-19 declaration under Section 564(b)(1) of the Act, 21 U.S.C.section 360bbb-3(b)(1), unless the authorization is terminated  or revoked sooner.       Influenza A by PCR NEGATIVE NEGATIVE Final   Influenza B by PCR NEGATIVE NEGATIVE Final    Comment: (NOTE) The Xpert Xpress SARS-CoV-2/FLU/RSV plus assay is intended as an aid in the diagnosis of influenza from Nasopharyngeal swab specimens and should not be used as a sole basis for treatment. Nasal washings and aspirates are  unacceptable for Xpert Xpress SARS-CoV-2/FLU/RSV testing.  Fact Sheet for Patients: EntrepreneurPulse.com.au  Fact Sheet for Healthcare Providers: IncredibleEmployment.be  This test is not yet approved or cleared by the [place] FDA and has been authorized for detection and/or diagnosis of SARS-CoV-2 by FDA under an Emergency Use Authorization (EUA). This EUA will remain in effect (meaning this test can be used) for the duration of the COVID-19 declaration under Section 564(b)(1) of the Act, 21 U.S.C. section 360bbb-3(b)(1), unless the authorization is terminated or revoked.  Performed at [facility], [place] [address], [place], [place] [address]   Culture, group A strep     Status: None (Preliminary result)   Collection Time: 06/29/2020 12:48 PM   Specimen: Throat  Result Value Ref Range Status   Specimen Description   Final    THROAT Performed at [facility] [address] [place], [place] [address]    Special Requests   Final    NONE Performed at [facility], [place] [address], [place], [place] [address]    Culture   Final    CULTURE REINCUBATED FOR BETTER GROWTH Performed at [facility], [place] [address] [place], [place] [address]    Report Status PENDING  Incomplete         Radiology Studies: CT HEAD WO CONTRAST  Result Date: 07/03/2020 CLINICAL DATA:  Delirium. EXAM: CT HEAD WITHOUT CONTRAST TECHNIQUE: Contiguous axial images were obtained from the base of the skull through the vertex without intravenous contrast. COMPARISON:  June 09, 2020 FINDINGS: Brain: No evidence of acute infarction, hemorrhage, hydrocephalus, extra-axial collection or mass lesion/mass effect. Atrophy and chronic small vessel ischemic changes in the white matter. Vascular: Calcific atherosclerotic disease of the intra cavernous carotid arteries. Skull: Normal. Negative for fracture or focal  lesion. Sinuses/Orbits: No acute finding. Other: None. IMPRESSION: 1. No acute intracranial abnormality. 2. Atrophy and chronic microvascular ischemic changes in the white matter. Electronically Signed   By: Fidela Salisbury M.D.   On: 06/24/2020 13:10   DG Chest Port 1 View  Result Date: 06/28/2020 CLINICAL DATA:  Confusion. EXAM: PORTABLE CHEST 1 VIEW COMPARISON:  March 21, 2020 FINDINGS: Enlarged cardiac silhouette. Calcific atherosclerotic disease and tortuosity of the aorta. Large hiatal hernia. There is no evidence of focal airspace consolidation, pleural effusion or pneumothorax. Osseous structures are without acute abnormality. Stable postsurgical changes in the right thorax. Breast implants. IMPRESSION:  1. Enlarged cardiac silhouette. 2. Large hiatal hernia. 3. Calcific atherosclerotic disease and tortuosity of the aorta. Electronically Signed   By: Fidela Salisbury M.D.   On: 06/19/2020 13:27        Scheduled Meds:  LORazepam  1 mg Intravenous Q4H   Continuous Infusions:   LOS: 0 days    Time spent: 42 min   Georgette Shell, MD 06/16/2020, 2:46 PM

## 2020-06-16 NOTE — Plan of Care (Signed)
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
## 2020-06-16 NOTE — TOC Initial Note (Signed)
Transition of Care [facility]) - Initial/Assessment Note    Patient Details  Name: Deanna [name] MRN: [id number] Date of Birth: 10-Oct-1926  [facility] CM/SW Contact:    Lynnell Catalan, RN Phone Number: 06/16/2020, 10:18 AM  Clinical Narrative:                 Pt is from [facility]. She is under hospice services at [facility] with [place]. [facility] is following along during hospital stay. Anticipate pt going back to [facility] with Hospice at [place].  Expected Discharge Plan: Assisted Living Barriers to Discharge: Continued Medical Work up Expected Discharge Plan and Services Expected Discharge Plan: Assisted Living   Discharge Planning Services: CM Consult   Living arrangements for the past 2 months: [place]: Hospice and [place]        Prior Living Arrangements/Services Living arrangements for the past 2 months: [place] Lives with:: Facility Resident Patient language and need for interpreter reviewed:: Yes        Need for Family Participation in Patient Care: Yes (Comment) Care giver support system in place?: Yes (comment) Current home services: Hospice Criminal Activity/Legal Involvement Pertinent to Current Situation/Hospitalization: No - Comment as needed  Activities of Daily Living Home Assistive Devices/Equipment: Environmental consultant (specify type), Dentures (specify type), Hearing aid, Eyeglasses (per old chart) ADL Screening (condition at time of admission) Patient's cognitive ability adequate to safely complete daily activities?: No Is the patient deaf or have difficulty hearing?: Yes Does the patient have difficulty seeing, even when wearing glasses/contacts?: No Does the patient have difficulty concentrating, remembering, or making decisions?: Yes Patient able to express need for assistance with ADLs?: No Does the patient have difficulty dressing or bathing?:  Yes Independently performs ADLs?: No Communication: Independent Dressing (OT): Needs assistance Is this a change from baseline?: Pre-admission baseline Grooming: Independent Feeding: Independent Bathing: Needs assistance Is this a change from baseline?: Pre-admission baseline Toileting: Needs assistance Is this a change from baseline?: Pre-admission baseline In/Out Bed: Needs assistance Is this a change from baseline?: Pre-admission baseline Walks in Home: Needs assistance Is this a change from baseline?: Pre-admission baseline Does the patient have difficulty walking or climbing stairs?: Yes Weakness of Legs: Both Weakness of Arms/Hands: Both  Permission Sought/Granted                  Emotional Assessment              Admission diagnosis:  Delirium [R41.0] AMS (altered mental status) [R41.82] Patient Active Problem List   Diagnosis Date Noted   AMS (altered mental status) 06/24/2020   Encounter for hospice care discussion    Palliative care encounter    Acute respiratory failure with hypoxia ([place]) 03/22/2020   Pneumonitis 03/22/2020   Acute CHF (congestive heart failure) ([place]) 03/21/2020   CHF (congestive heart failure) ([place]) 03/21/2020   Hypoxia    Hypertensive urgency 09/21/2015   Chest pain 09/20/2015   Hypercalcemia 05/09/2015   Encounter for therapeutic drug monitoring 02/02/2013   Personal history of colonic polyps 04/07/2012   Barrett's esophagus 04/07/2012   Osteoarthritis 02/01/2012   URI (upper respiratory infection) 01/16/2012   Gout 12/24/2007   TRICUSPID REGURGITATION 10/23/2006   ATRIAL FIBRILLATION 10/15/2006   HYPERGLYCEMIA 10/15/2006   Hyperlipidemia 09/23/2006   ANXIETY 09/23/2006   Essential hypertension 09/23/2006   GERD 09/23/2006   PCP:  Felipa Eth,  Christiane Ha, MD Pharmacy:   [id number] - [place], [place] [address] Phone: [phone] Fax: [phone]  [facility]  ([place], [address], [address] [place], [place] [address] Phone: [phone] Fax: [phone]     Social Determinants of Health (SDOH) Interventions    Readmission Risk Interventions No flowsheet data found.

## 2020-06-17 LAB — PTH, INTACT AND CALCIUM
Calcium, Total (PTH): 11 mg/dL — ABNORMAL HIGH (ref 8.7–10.3)
PTH: 8 pg/mL — ABNORMAL LOW (ref 15–65)

## 2020-06-17 LAB — CULTURE, GROUP A STREP (THRC)

## 2020-06-17 NOTE — Progress Notes (Signed)
PROGRESS NOTE    Deanna [name]  FHL:[id number] DOB: September 09, 1926 DOA: 06/29/2020 PCP: Lajean Manes, MD   Brief Narrative: 87 year old female from assisted living facility with history of hyperlipidemia hypertension and GERD admitted with altered mental status.  Patient was on hospice prior to admission.  In the ED she was noted to be clinically dehydrated with hypokalemia.  Assessment & Plan:   Active Problems:   AMS (altered mental status)  #1 acute encephalopathy secondary to multifactorial etiology with UTI, hypercalcemia, dehydration, hypokalemia. Had family meeting with the patient's daughter and son.  Her daughter is the power of attorney.  They do not want their mother to suffer and go in and out of the hospital without any benefits for her.  They would like their mother to be comfortable without any IVs or monitoring or telemetry boxes. I agree this was the best decision the family could have made for this patient.  After discussing with the family I have placed the patient on comfort care with no further lab works  Patient's CODE STATUS is DO NOT RESUSCITATE She is comfort care as of 06/16/2020. Ativan morphine ordered.   Estimated body mass index is 27.53 kg/m as calculated from the following:   Height as of 03/21/20: 5\' 2"  (1.575 m).   Weight as of 03/27/20: 68.3 kg.  DVT prophylaxis: None comfort care  code Status: dnr Family Communication: Discussed with patient's daughter and son at bedside  disposition Plan:  Status is: Inpatient  Remains inpatient appropriate because:Altered mental status and IV treatments appropriate due to intensity of illness or inability to take PO  Dispo: The patient is from:  AL              Anticipated d/c is to: SNF              Patient currently is not medically stable to d/c.   Difficult to place patient No       Consultants:  NONE  Procedures: NONE Antimicrobials: NONE  Subjective:  She is resting in bed appears less distressed  than yesterday on morphine and Ativan and comfort care objective: Vitals:   06/16/20 0637 06/16/20 1312 06/16/20 1933 06/17/20 0514  BP: (!) 198/77 (!) 198/69 (!) 186/82 (!) 160/96  Pulse: 77 79 72 76  Resp: 18 18 18 18   Temp: 98.6 F (37 C)  97.7 F (36.5 C) (!) 97.5 F (36.4 C)  TempSrc: Oral Oral Oral Oral  SpO2: 98% 94% 100% 94%    Intake/Output Summary (Last 24 hours) at 06/17/2020 1408 Last data filed at 06/17/2020 0300 Gross per 24 hour  Intake --  Output 450 ml  Net -450 ml    There were no vitals filed for this visit.  Examination:  General exam: Appears in less distress than yesterday Respiratory system: Clear to auscultation. Respiratory effort normal. Cardiovascular system: S1 & S2 heard, RRR. No JVD, murmurs, rubs, gallops or clicks. No pedal edema. Gastrointestinal system: Abdomen is nondistended, soft and nontender. No organomegaly or masses felt. Normal bowel sounds heard. Central nervous system: Confused delirious  extremities: Moves all extremities. Skin: No rashes, lesions or ulcers Psychiatry: Impaired judgment   Data Reviewed: I have personally reviewed following labs and imaging studies  CBC: Recent Labs  Lab 06/14/2020 1202 06/16/20 0552  WBC 6.3 7.0  NEUTROABS 4.0  --   HGB 15.7* 15.0  HCT 48.9* 46.0  MCV 90.2 89.7  PLT 311 373    Basic Metabolic Panel: Recent Labs  Lab 06/11/2020 1202 07/02/2020 1732 06/16/20 0552  NA 134*  --  139  K 3.1*  --  3.5  CL 94*  --  102  CO2 30  --  26  GLUCOSE 115*  --  136*  BUN 28*  --  17  CREATININE 1.04*  --  0.73  CALCIUM 10.6* 11.0* 10.5*  MG 1.8  --   --     GFR: CrCl cannot be calculated (Unknown ideal weight.). Liver Function Tests: Recent Labs  Lab 06/09/2020 1202 06/10/2020 1732 06/16/20 0552  AST 40  --  44*  ALT 28  --  29  ALKPHOS 59  --  53  BILITOT 2.1* 1.8* 2.0*  PROT 7.0  --  6.8  ALBUMIN 3.7  --  3.5    No results for input(s): LIPASE, AMYLASE in the last 168  hours. Recent Labs  Lab 06/14/2020 1202  AMMONIA 19    Coagulation Profile: Recent Labs  Lab 06/17/2020 1202  INR 1.1    Cardiac Enzymes: No results for input(s): CKTOTAL, CKMB, CKMBINDEX, TROPONINI in the last 168 hours. BNP (last 3 results) No results for input(s): PROBNP in the last 8760 hours. HbA1C: No results for input(s): HGBA1C in the last 72 hours. CBG: Recent Labs  Lab 06/12/2020 1205  GLUCAP 115*    Lipid Profile: No results for input(s): CHOL, HDL, LDLCALC, TRIG, CHOLHDL, LDLDIRECT in the last 72 hours. Thyroid Function Tests: No results for input(s): TSH, T4TOTAL, FREET4, T3FREE, THYROIDAB in the last 72 hours. Anemia Panel: No results for input(s): VITAMINB12, FOLATE, FERRITIN, TIBC, IRON, RETICCTPCT in the last 72 hours. Sepsis Labs: Recent Labs  Lab 06/29/2020 1202  LATICACIDVEN 0.9     Recent Results (from the past 240 hour(s))  Urine Culture     Status: Abnormal   Collection Time: 06/09/20  6:28 AM   Specimen: Urine, Random  Result Value Ref Range Status   Specimen Description   Final    URINE, RANDOM Performed at [facility] [address], [place], [place] [address]    Special Requests   Final    NONE Performed at [facility], [place] [address] [place], [place] [address]    Culture MULTIPLE SPECIES PRESENT, SUGGEST RECOLLECTION (A)  Final   Report Status 06/10/2020 FINAL  Final  Urine culture     Status: Abnormal   Collection Time: 06/30/2020 12:02 PM   Specimen: Urine, Random  Result Value Ref Range Status   Specimen Description   Final    URINE, RANDOM Performed at [facility] [address] [place], [place] [address]    Special Requests   Final    NONE Performed at [facility], [place] [address], [place], [place] [address]    Culture MULTIPLE SPECIES PRESENT, SUGGEST RECOLLECTION (A)  Final   Report Status 06/16/2020 FINAL  Final  Resp Panel by RT-PCR (Flu  A&B, Covid) Nasopharyngeal Swab     Status: None   Collection Time: 06/09/2020 12:48 PM   Specimen: Nasopharyngeal Swab; Nasopharyngeal(NP) swabs in vial transport medium  Result Value Ref Range Status   SARS Coronavirus 2 by RT PCR NEGATIVE NEGATIVE Final    Comment: (NOTE) SARS-CoV-2 target nucleic acids are NOT DETECTED.  The SARS-CoV-2 RNA is generally detectable in upper respiratory specimens during the acute phase of infection. The lowest concentration of SARS-CoV-2 viral copies this assay can detect is 138 copies/mL. A negative result does not preclude SARS-Cov-2 infection and  should not be used as the sole basis for treatment or other patient management decisions. A negative result may occur with  improper specimen collection/handling, submission of specimen other than nasopharyngeal swab, presence of viral mutation(s) within the areas targeted by this assay, and inadequate number of viral copies(<138 copies/mL). A negative result must be combined with clinical observations, patient history, and epidemiological information. The expected result is Negative.  Fact Sheet for Patients:  EntrepreneurPulse.com.au  Fact Sheet for Healthcare Providers:  IncredibleEmployment.be  This test is no t yet approved or cleared by the [place] FDA and  has been authorized for detection and/or diagnosis of SARS-CoV-2 by FDA under an Emergency Use Authorization (EUA). This EUA will remain  in effect (meaning this test can be used) for the duration of the COVID-19 declaration under Section 564(b)(1) of the Act, 21 U.S.C.section 360bbb-3(b)(1), unless the authorization is terminated  or revoked sooner.       Influenza A by PCR NEGATIVE NEGATIVE Final   Influenza B by PCR NEGATIVE NEGATIVE Final    Comment: (NOTE) The Xpert Xpress SARS-CoV-2/FLU/RSV plus assay is intended as an aid in the diagnosis of influenza from Nasopharyngeal swab specimens  and should not be used as a sole basis for treatment. Nasal washings and aspirates are unacceptable for Xpert Xpress SARS-CoV-2/FLU/RSV testing.  Fact Sheet for Patients: EntrepreneurPulse.com.au  Fact Sheet for Healthcare Providers: IncredibleEmployment.be  This test is not yet approved or cleared by the [place] FDA and has been authorized for detection and/or diagnosis of SARS-CoV-2 by FDA under an Emergency Use Authorization (EUA). This EUA will remain in effect (meaning this test can be used) for the duration of the COVID-19 declaration under Section 564(b)(1) of the Act, 21 U.S.C. section 360bbb-3(b)(1), unless the authorization is terminated or revoked.  Performed at [facility], [place] [address], [place], [place] [address]   Culture, group A strep     Status: None   Collection Time: 06/14/2020 12:48 PM   Specimen: Throat  Result Value Ref Range Status   Specimen Description   Final    THROAT Performed at [facility] [address], [place], [place] [address]    Special Requests   Final    NONE Performed at [facility], [place] [address], [place], [place] [address]    Culture   Final    NO GROUP A STREP (S.PYOGENES) ISOLATED Performed at [facility], [place] [address] [place] [address], [place], [place] [address]    Report Status 06/17/2020 FINAL  Final          Radiology Studies: No results found.      Scheduled Meds:  LORazepam  1 mg Intravenous Q4H   Continuous Infusions:   LOS: 1 day    Time spent: 42 min   Georgette Shell, MD 06/17/2020, 2:08 PM
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
## 2020-06-18 NOTE — Progress Notes (Signed)
PROGRESS NOTE    Deanna [name]  OXB:[id number] DOB: Jan 08, 1927 DOA: 06/28/2020 PCP: Lajean Manes, MD   Brief Narrative: 87 year old female from assisted living facility with history of hyperlipidemia hypertension and GERD admitted with altered mental status.  Patient was on hospice prior to admission.  In the ED she was noted to be clinically dehydrated with hypokalemia.  Assessment & Plan:   Active Problems:   AMS (altered mental status)  #1 acute encephalopathy secondary to multifactorial etiology with UTI, hypercalcemia, dehydration, hypokalemia. Had family meeting with the patient's daughter and son.  Her daughter is the power of attorney.  They do not want their mother to suffer and go in and out of the hospital without any benefits for her.  They would like their mother to be comfortable without any IVs or monitoring or telemetry boxes. I agree this was the best decision the family could have made for this patient.  After discussing with the family I have placed the patient on comfort care with no further lab works  Patient's CODE STATUS is DO NOT RESUSCITATE She is comfort care as of 06/16/2020. Ativan morphine ordered.   Estimated body mass index is 21.77 kg/m as calculated from the following:   Height as of 03/21/20: 5\' 2"  (1.575 m).   Weight as of this encounter: 54 kg.  DVT prophylaxis: None comfort care  code Status: dnr Family Communication: Discussed with patient's daughter and son at bedside  disposition Plan:  Status is: Inpatient  Remains inpatient appropriate because:Altered mental status and IV treatments appropriate due to intensity of illness or inability to take PO  Dispo: The patient is from:  [place]              Anticipated d/c is to: Expecting hospital death              Patient currently is not medically stable to d/c.   Difficult to place patient No    Consultants:  NONE  Procedures: NONE Antimicrobials: NONE  Subjective: Patient resting in bed  appears to be in less distress on morphine and Ativan  objective: Vitals:   06/16/20 1933 06/17/20 0514 06/17/20 1300 06/18/20 0534  BP: (!) 186/82 (!) 160/96 (!) 153/68 (!) 145/74  Pulse: 72 76 78 88  Resp: 18 18 20 20   Temp: 97.7 F (36.5 C) (!) 97.5 F (36.4 C) (!) 97 F (36.1 C) 97.6 F (36.4 C)  TempSrc: Oral Oral Oral Oral  SpO2: 100% 94% 95% 94%  Weight:    54 kg    Intake/Output Summary (Last 24 hours) at 06/18/2020 1443 Last data filed at 06/18/2020 0430 Gross per 24 hour  Intake --  Output 350 ml  Net -350 ml    Filed Weights   06/18/20 0534  Weight: 54 kg    Examination:  General exam: Appears in less distress than yesterday Respiratory system: Clear to auscultation. Respiratory effort normal. Cardiovascular system: S1 & S2 heard, RRR. No JVD, murmurs, rubs, gallops or clicks. No pedal edema. Gastrointestinal system: Abdomen is nondistended, soft and nontender. No organomegaly or masses felt. Normal bowel sounds heard. Central nervous system: Confused delirious  extremities: Moves all extremities. Skin: No rashes, lesions or ulcers Psychiatry: Impaired judgment   Data Reviewed: I have personally reviewed following labs and imaging studies  CBC: Recent Labs  Lab 06/29/2020 1202 06/16/20 0552  WBC 6.3 7.0  NEUTROABS 4.0  --   HGB 15.7* 15.0  HCT 48.9* 46.0  MCV 90.2 89.7  PLT 311 400    Basic Metabolic Panel: Recent Labs  Lab 06/28/2020 1202 06/18/2020 1732 06/16/20 0552  NA 134*  --  139  K 3.1*  --  3.5  CL 94*  --  102  CO2 30  --  26  GLUCOSE 115*  --  136*  BUN 28*  --  17  CREATININE 1.04*  --  0.73  CALCIUM 10.6* 11.0* 10.5*  MG 1.8  --   --     GFR: Estimated Creatinine Clearance: 36.2 mL/min (by C-G formula based on SCr of 0.73 mg/dL). Liver Function Tests: Recent Labs  Lab 07/07/2020 1202 06/11/2020 1732 06/16/20 0552  AST 40  --  44*  ALT 28  --  29  ALKPHOS 59  --  53  BILITOT 2.1* 1.8* 2.0*  PROT 7.0  --  6.8  ALBUMIN  3.7  --  3.5    No results for input(s): LIPASE, AMYLASE in the last 168 hours. Recent Labs  Lab 07/01/2020 1202  AMMONIA 19    Coagulation Profile: Recent Labs  Lab 07/05/2020 1202  INR 1.1    Cardiac Enzymes: No results for input(s): CKTOTAL, CKMB, CKMBINDEX, TROPONINI in the last 168 hours. BNP (last 3 results) No results for input(s): PROBNP in the last 8760 hours. HbA1C: No results for input(s): HGBA1C in the last 72 hours. CBG: Recent Labs  Lab 06/11/2020 1205  GLUCAP 115*    Lipid Profile: No results for input(s): CHOL, HDL, LDLCALC, TRIG, CHOLHDL, LDLDIRECT in the last 72 hours. Thyroid Function Tests: No results for input(s): TSH, T4TOTAL, FREET4, T3FREE, THYROIDAB in the last 72 hours. Anemia Panel: No results for input(s): VITAMINB12, FOLATE, FERRITIN, TIBC, IRON, RETICCTPCT in the last 72 hours. Sepsis Labs: Recent Labs  Lab 06/14/2020 1202  LATICACIDVEN 0.9     Recent Results (from the past 240 hour(s))  Urine Culture     Status: Abnormal   Collection Time: 06/09/20  6:28 AM   Specimen: Urine, Random  Result Value Ref Range Status   Specimen Description   Final    URINE, RANDOM Performed at [facility] [address] [place], [place] [address]    Special Requests   Final    NONE Performed at [facility], [place] [address] [place], [place] [address]    Culture MULTIPLE SPECIES PRESENT, SUGGEST RECOLLECTION (A)  Final   Report Status 06/10/2020 FINAL  Final  Urine culture     Status: Abnormal   Collection Time: 07/01/2020 12:02 PM   Specimen: Urine, Random  Result Value Ref Range Status   Specimen Description   Final    URINE, RANDOM Performed at [facility] [address] [place], [place] [address]    Special Requests   Final    NONE Performed at [facility], [place] [address], [place], [place] [address]    Culture MULTIPLE SPECIES PRESENT, SUGGEST RECOLLECTION (A)   Final   Report Status 06/16/2020 FINAL  Final  Resp Panel by RT-PCR (Flu A&B, Covid) Nasopharyngeal Swab     Status: None   Collection Time: 06/10/2020 12:48 PM   Specimen: Nasopharyngeal Swab; Nasopharyngeal(NP) swabs in vial transport medium  Result Value Ref Range Status   SARS Coronavirus 2 by RT PCR NEGATIVE NEGATIVE Final    Comment: (NOTE) SARS-CoV-2 target nucleic acids are NOT DETECTED.  The SARS-CoV-2 RNA is generally detectable in upper respiratory specimens during the acute phase of infection. The lowest concentration of  SARS-CoV-2 viral copies this assay can detect is 138 copies/mL. A negative result does not preclude SARS-Cov-2 infection and should not be used as the sole basis for treatment or other patient management decisions. A negative result may occur with  improper specimen collection/handling, submission of specimen other than nasopharyngeal swab, presence of viral mutation(s) within the areas targeted by this assay, and inadequate number of viral copies(<138 copies/mL). A negative result must be combined with clinical observations, patient history, and epidemiological information. The expected result is Negative.  Fact Sheet for Patients:  EntrepreneurPulse.com.au  Fact Sheet for Healthcare Providers:  IncredibleEmployment.be  This test is no t yet approved or cleared by the [place] FDA and  has been authorized for detection and/or diagnosis of SARS-CoV-2 by FDA under an Emergency Use Authorization (EUA). This EUA will remain  in effect (meaning this test can be used) for the duration of the COVID-19 declaration under Section 564(b)(1) of the Act, 21 U.S.C.section 360bbb-3(b)(1), unless the authorization is terminated  or revoked sooner.       Influenza A by PCR NEGATIVE NEGATIVE Final   Influenza B by PCR NEGATIVE NEGATIVE Final    Comment: (NOTE) The Xpert Xpress SARS-CoV-2/FLU/RSV plus assay is intended as an  aid in the diagnosis of influenza from Nasopharyngeal swab specimens and should not be used as a sole basis for treatment. Nasal washings and aspirates are unacceptable for Xpert Xpress SARS-CoV-2/FLU/RSV testing.  Fact Sheet for Patients: EntrepreneurPulse.com.au  Fact Sheet for Healthcare Providers: IncredibleEmployment.be  This test is not yet approved or cleared by the [place] FDA and has been authorized for detection and/or diagnosis of SARS-CoV-2 by FDA under an Emergency Use Authorization (EUA). This EUA will remain in effect (meaning this test can be used) for the duration of the COVID-19 declaration under Section 564(b)(1) of the Act, 21 U.S.C. section 360bbb-3(b)(1), unless the authorization is terminated or revoked.  Performed at [facility], [place] [address], [place], [place] [address]   Culture, group A strep     Status: None   Collection Time: 06/20/2020 12:48 PM   Specimen: Throat  Result Value Ref Range Status   Specimen Description   Final    THROAT Performed at [facility] [address], [place], [place] [address]    Special Requests   Final    NONE Performed at [facility], [place] [address], [place], [place] [address]    Culture   Final    NO GROUP A STREP (S.PYOGENES) ISOLATED Performed at [facility], [place] [address], [place], [place] [address]    Report Status 06/17/2020 FINAL  Final          Radiology Studies: No results found.      Scheduled Meds:  LORazepam  1 mg Intravenous Q4H   Continuous Infusions:   LOS: 2 days    Time spent: 42 min   Georgette Shell, MD 06/18/2020, 2:43 PM

## 2020-06-19 MED ORDER — LIP MEDEX EX OINT
TOPICAL_OINTMENT | CUTANEOUS | Status: AC
  Filled 2020-06-19: qty 7

## 2020-06-19 NOTE — Progress Notes (Addendum)
Patient's son,daughter and grand daughter at bedside with patient. Deanna [name] is experiencing periods of apnea. Family was given "Gone from Site".

## 2020-06-19 NOTE — Progress Notes (Signed)
PROGRESS NOTE    Deanna [name]  TGG:[id number] DOB: [birth year] DOA: 06/08/2020 PCP: Lajean Manes, MD   Brief Narrative: 87 year old female from assisted living facility with history of hyperlipidemia hypertension and GERD admitted with altered mental status.  Patient was on hospice prior to admission.  In the ED she was noted to be clinically dehydrated with hypokalemia.  Assessment & Plan:   Active Problems:   AMS (altered mental status)  #1 acute encephalopathy secondary to multifactorial etiology with UTI, hypercalcemia, dehydration, hypokalemia. Had family meeting with the patient's daughter and son.  Her daughter is the power of attorney.  They do not want their mother to suffer and go in and out of the hospital without any benefits for her.  They would like their mother to be comfortable without any IVs or monitoring or telemetry boxes. I agree this was the best decision the family could have made for this patient.  After discussing with the family I have placed the patient on comfort care with no further lab works  Patient's CODE STATUS is DO NOT RESUSCITATE She is comfort care as of 06/16/2020. She appears to be more comfortable today than yesterday   Continue morphine and Ativan.  Estimated body mass index is 21.56 kg/m as calculated from the following:   Height as of 03/21/20: 5\' 2"  (1.575 m).   Weight as of this encounter: 53.5 kg.  DVT prophylaxis: None comfort care  code Status: dnr Family Communication: Discussed with patient's daughter and son at bedside  disposition Plan:  Status is: Inpatient  Remains inpatient appropriate because:Altered mental status and IV treatments appropriate due to intensity of illness or inability to take PO  Dispo: The patient is from:  [facility]              Anticipated d/c is to: Expecting hospital death              Patient currently is not medically stable to d/c.   Difficult to place patient No    Consultants:  NONE  Procedures:  NONE Antimicrobials: NONE  Subjective:  She is resting not responding to questions or commands or following commands no overnight events she appears to be more comfortable than yesterday on morphine and Ativan objective: Vitals:   06/17/20 1300 06/18/20 0534 06/18/20 2102 06/19/20 0430  BP: (!) 153/68 (!) 145/74 (!) 143/88 (!) 219/86  Pulse: 78 88 91 88  Resp: 20 20 19 20   Temp: (!) 97 F (36.1 C) 97.6 F (36.4 C) 98 F (36.7 C) 97.8 F (36.6 C)  TempSrc: Oral Oral Axillary Axillary  SpO2: 95% 94% 95% 92%  Weight:  54 kg  53.5 kg    Intake/Output Summary (Last 24 hours) at 06/19/2020 1134 Last data filed at 06/19/2020 0805 Gross per 24 hour  Intake 0 ml  Output --  Net 0 ml    Filed Weights   06/18/20 0534 06/19/20 0430  Weight: 54 kg 53.5 kg    Examination:  General exam: Appears in less distress than yesterday Respiratory system: Clear to auscultation. Respiratory effort normal. Cardiovascular system: S1 & S2 heard, RRR. No JVD, murmurs, rubs, gallops or clicks. No pedal edema. Gastrointestinal system: Abdomen is nondistended, soft and nontender. No organomegaly or masses felt. Normal bowel sounds heard. Central nervous system: Confused delirious  extremities: Moves all extremities. Skin: No rashes, lesions or ulcers Psychiatry: Impaired judgment   Data Reviewed: I have personally reviewed following labs and imaging studies  CBC: Recent Labs  Lab 06/09/2020 1202 06/16/20 0552  WBC 6.3 7.0  NEUTROABS 4.0  --   HGB 15.7* 15.0  HCT 48.9* 46.0  MCV 90.2 89.7  PLT 311 381    Basic Metabolic Panel: Recent Labs  Lab 06/26/2020 1202 06/18/2020 1732 06/16/20 0552  NA 134*  --  139  K 3.1*  --  3.5  CL 94*  --  102  CO2 30  --  26  GLUCOSE 115*  --  136*  BUN 28*  --  17  CREATININE 1.04*  --  0.73  CALCIUM 10.6* 11.0* 10.5*  MG 1.8  --   --     GFR: Estimated Creatinine Clearance: 36.2 mL/min (by C-G formula based on SCr of 0.73 mg/dL). Liver Function  Tests: Recent Labs  Lab 07/01/2020 1202 06/23/2020 1732 06/16/20 0552  AST 40  --  44*  ALT 28  --  29  ALKPHOS 59  --  53  BILITOT 2.1* 1.8* 2.0*  PROT 7.0  --  6.8  ALBUMIN 3.7  --  3.5    No results for input(s): LIPASE, AMYLASE in the last 168 hours. Recent Labs  Lab 06/12/2020 1202  AMMONIA 19    Coagulation Profile: Recent Labs  Lab 06/08/2020 1202  INR 1.1    Cardiac Enzymes: No results for input(s): CKTOTAL, CKMB, CKMBINDEX, TROPONINI in the last 168 hours. BNP (last 3 results) No results for input(s): PROBNP in the last 8760 hours. HbA1C: No results for input(s): HGBA1C in the last 72 hours. CBG: Recent Labs  Lab 07/04/2020 1205  GLUCAP 115*    Lipid Profile: No results for input(s): CHOL, HDL, LDLCALC, TRIG, CHOLHDL, LDLDIRECT in the last 72 hours. Thyroid Function Tests: No results for input(s): TSH, T4TOTAL, FREET4, T3FREE, THYROIDAB in the last 72 hours. Anemia Panel: No results for input(s): VITAMINB12, FOLATE, FERRITIN, TIBC, IRON, RETICCTPCT in the last 72 hours. Sepsis Labs: Recent Labs  Lab 06/09/2020 1202  LATICACIDVEN 0.9     Recent Results (from the past 240 hour(s))  Urine culture     Status: Abnormal   Collection Time: 06/26/2020 12:02 PM   Specimen: Urine, Random  Result Value Ref Range Status   Specimen Description   Final    URINE, RANDOM Performed at [facility] [address] [place], [place] [address]    Special Requests   Final    NONE Performed at [facility], [place] [address] [place], [place] [address]    Culture MULTIPLE SPECIES PRESENT, SUGGEST RECOLLECTION (A)  Final   Report Status 06/16/2020 FINAL  Final  Resp Panel by RT-PCR (Flu A&B, Covid) Nasopharyngeal Swab     Status: None   Collection Time: 07/06/2020 12:48 PM   Specimen: Nasopharyngeal Swab; Nasopharyngeal(NP) swabs in vial transport medium  Result Value Ref Range Status   SARS Coronavirus 2 by RT PCR NEGATIVE NEGATIVE Final     Comment: (NOTE) SARS-CoV-2 target nucleic acids are NOT DETECTED.  The SARS-CoV-2 RNA is generally detectable in upper respiratory specimens during the acute phase of infection. The lowest concentration of SARS-CoV-2 viral copies this assay can detect is 138 copies/mL. A negative result does not preclude SARS-Cov-2 infection and should not be used as the sole basis for treatment or other patient management decisions. A negative result may occur with  improper specimen collection/handling, submission of specimen other than nasopharyngeal swab, presence of viral mutation(s) within the areas targeted by this assay, and inadequate number of viral copies(<138 copies/mL). A  negative result must be combined with clinical observations, patient history, and epidemiological information. The expected result is Negative.  Fact Sheet for Patients:  EntrepreneurPulse.com.au  Fact Sheet for Healthcare Providers:  IncredibleEmployment.be  This test is no t yet approved or cleared by the [place] FDA and  has been authorized for detection and/or diagnosis of SARS-CoV-2 by FDA under an Emergency Use Authorization (EUA). This EUA will remain  in effect (meaning this test can be used) for the duration of the COVID-19 declaration under Section 564(b)(1) of the Act, 21 U.S.C.section 360bbb-3(b)(1), unless the authorization is terminated  or revoked sooner.       Influenza A by PCR NEGATIVE NEGATIVE Final   Influenza B by PCR NEGATIVE NEGATIVE Final    Comment: (NOTE) The Xpert Xpress SARS-CoV-2/FLU/RSV plus assay is intended as an aid in the diagnosis of influenza from Nasopharyngeal swab specimens and should not be used as a sole basis for treatment. Nasal washings and aspirates are unacceptable for Xpert Xpress SARS-CoV-2/FLU/RSV testing.  Fact Sheet for Patients: EntrepreneurPulse.com.au  Fact Sheet for Healthcare  Providers: IncredibleEmployment.be  This test is not yet approved or cleared by the [place] FDA and has been authorized for detection and/or diagnosis of SARS-CoV-2 by FDA under an Emergency Use Authorization (EUA). This EUA will remain in effect (meaning this test can be used) for the duration of the COVID-19 declaration under Section 564(b)(1) of the Act, 21 U.S.C. section 360bbb-3(b)(1), unless the authorization is terminated or revoked.  Performed at [facility], [place] [address] [place], [place] [address]   Culture, group A strep     Status: None   Collection Time: 07/02/2020 12:48 PM   Specimen: Throat  Result Value Ref Range Status   Specimen Description   Final    THROAT Performed at [facility] [address], [place], [place] [address]    Special Requests   Final    NONE Performed at [facility], [place] [address] [place], [place] [address]    Culture   Final    NO GROUP A STREP (S.PYOGENES) ISOLATED Performed at [facility], [place] [address] [place], [place] [address]    Report Status 06/17/2020 FINAL  Final          Radiology Studies: No results found.      Scheduled Meds:  LORazepam  1 mg Intravenous Q4H   Continuous Infusions:   LOS: 3 days    Time spent: 42 min   Georgette Shell, MD 06/19/2020, 11:34 AM
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
## 2020-06-20 NOTE — Progress Notes (Signed)
PROGRESS NOTE    Deanna [name]  CZY:[id number] DOB: 1926-12-26 DOA: 06/23/2020 PCP: Lajean Manes, MD   Brief Narrative: 87 year old female from assisted living facility with history of hyperlipidemia hypertension and GERD admitted with altered mental status.  Patient was on hospice prior to admission.  In the ED she was noted to be clinically dehydrated with hypokalemia.  Assessment & Plan:   Active Problems:   AMS (altered mental status)  #1 acute encephalopathy secondary to multifactorial etiology with UTI, hypercalcemia, dehydration, hypokalemia. Had family meeting with the patient's daughter and son.  Her daughter is the power of attorney.  They do not want their mother to suffer and go in and out of the hospital without any benefits for her.  They would like their mother to be comfortable without any IVs or monitoring or telemetry boxes. I agree this was the best decision the family could have made for this patient.  After discussing with the family I have placed the patient on comfort care with no further lab works  Patient's CODE STATUS is DO NOT RESUSCITATE She is comfort care as of 06/16/2020. She appears to be more comfortable today than yesterday   Continue morphine and Ativan. Patient remains inpatient appropriate due to imminent death and need for ongoing IV comfort medications for delirium and end-of-life, patient is not able to take medicines p.o. at this time.  Estimated body mass index is 21.56 kg/m as calculated from the following:   Height as of 03/21/20: 5\' 2"  (1.575 m).   Weight as of this encounter: 53.5 kg.  DVT prophylaxis: None comfort care  code Status: dnr Family Communication: Discussed with patient's daughter and son at bedside  disposition Plan:  Status is: Inpatient  Remains inpatient appropriate because:Altered mental status and IV treatments appropriate due to intensity of illness or inability to take PO  Dispo: The patient is from:  [facility]               Anticipated d/c is to: Expecting hospital death              Patient currently is not medically stable to d/c.   Difficult to place patient No    Consultants:  NONE  Procedures: NONE Antimicrobials: NONE  Subjective:  Family by the bedside she has more apneic episodes today She appears comfortable than yesterday objective: Vitals:   06/18/20 0534 06/18/20 2102 06/19/20 0430 06/20/20 0456  BP: (!) 145/74 (!) 143/88 (!) 219/86 (!) 205/104  Pulse: 88 91 88 93  Resp: 20 19 20 16   Temp: 97.6 F (36.4 C) 98 F (36.7 C) 97.8 F (36.6 C) 98.1 F (36.7 C)  TempSrc: Oral Axillary Axillary Oral  SpO2: 94% 95% 92% 99%  Weight: 54 kg  53.5 kg     Intake/Output Summary (Last 24 hours) at 06/20/2020 1551 Last data filed at 06/19/2020 1900 Gross per 24 hour  Intake --  Output 260 ml  Net -260 ml    Filed Weights   06/18/20 0534 06/19/20 0430  Weight: 54 kg 53.5 kg    Examination:  General exam: Appears in less distress than yesterday Respiratory system: Clear to auscultation. Respiratory effort normal. Cardiovascular system: S1 & S2 heard, RRR. No JVD, murmurs, rubs, gallops or clicks. No pedal edema. Gastrointestinal system: Abdomen is nondistended, soft and nontender. No organomegaly or masses felt. Normal bowel sounds heard. Central nervous system: Confused delirious  extremities: Moves all extremities. Skin: No rashes, lesions or ulcers Psychiatry: Impaired judgment  Data Reviewed: I have personally reviewed following labs and imaging studies  CBC: Recent Labs  Lab 06/25/2020 1202 06/16/20 0552  WBC 6.3 7.0  NEUTROABS 4.0  --   HGB 15.7* 15.0  HCT 48.9* 46.0  MCV 90.2 89.7  PLT 311 144    Basic Metabolic Panel: Recent Labs  Lab 07/07/2020 1202 06/14/2020 1732 06/16/20 0552  NA 134*  --  139  K 3.1*  --  3.5  CL 94*  --  102  CO2 30  --  26  GLUCOSE 115*  --  136*  BUN 28*  --  17  CREATININE 1.04*  --  0.73  CALCIUM 10.6* 11.0* 10.5*  MG 1.8  --   --      GFR: Estimated Creatinine Clearance: 36.2 mL/min (by C-G formula based on SCr of 0.73 mg/dL). Liver Function Tests: Recent Labs  Lab 06/18/2020 1202 06/21/2020 1732 06/16/20 0552  AST 40  --  44*  ALT 28  --  29  ALKPHOS 59  --  53  BILITOT 2.1* 1.8* 2.0*  PROT 7.0  --  6.8  ALBUMIN 3.7  --  3.5    No results for input(s): LIPASE, AMYLASE in the last 168 hours. Recent Labs  Lab 07/04/2020 1202  AMMONIA 19    Coagulation Profile: Recent Labs  Lab 06/25/2020 1202  INR 1.1    Cardiac Enzymes: No results for input(s): CKTOTAL, CKMB, CKMBINDEX, TROPONINI in the last 168 hours. BNP (last 3 results) No results for input(s): PROBNP in the last 8760 hours. HbA1C: No results for input(s): HGBA1C in the last 72 hours. CBG: Recent Labs  Lab 07/04/2020 1205  GLUCAP 115*    Lipid Profile: No results for input(s): CHOL, HDL, LDLCALC, TRIG, CHOLHDL, LDLDIRECT in the last 72 hours. Thyroid Function Tests: No results for input(s): TSH, T4TOTAL, FREET4, T3FREE, THYROIDAB in the last 72 hours. Anemia Panel: No results for input(s): VITAMINB12, FOLATE, FERRITIN, TIBC, IRON, RETICCTPCT in the last 72 hours. Sepsis Labs: Recent Labs  Lab 06/16/2020 1202  LATICACIDVEN 0.9     Recent Results (from the past 240 hour(s))  Urine culture     Status: Abnormal   Collection Time: 06/17/2020 12:02 PM   Specimen: Urine, Random  Result Value Ref Range Status   Specimen Description   Final    URINE, RANDOM Performed at [place] [address] [place], [place] [address]    Special Requests   Final    NONE Performed at [facility], [place] [address] [place], [place] [address]    Culture MULTIPLE SPECIES PRESENT, SUGGEST RECOLLECTION (A)  Final   Report Status 06/16/2020 FINAL  Final  Resp Panel by RT-PCR (Flu A&B, Covid) Nasopharyngeal Swab     Status: None   Collection Time: 06/29/2020 12:48 PM   Specimen: Nasopharyngeal Swab; Nasopharyngeal(NP)  swabs in vial transport medium  Result Value Ref Range Status   SARS Coronavirus 2 by RT PCR NEGATIVE NEGATIVE Final    Comment: (NOTE) SARS-CoV-2 target nucleic acids are NOT DETECTED.  The SARS-CoV-2 RNA is generally detectable in upper respiratory specimens during the acute phase of infection. The lowest concentration of SARS-CoV-2 viral copies this assay can detect is 138 copies/mL. A negative result does not preclude SARS-Cov-2 infection and should not be used as the sole basis for treatment or other patient management decisions. A negative result may occur with  improper specimen collection/handling, submission of specimen other than nasopharyngeal swab, presence of viral  mutation(s) within the areas targeted by this assay, and inadequate number of viral copies(<138 copies/mL). A negative result must be combined with clinical observations, patient history, and epidemiological information. The expected result is Negative.  Fact Sheet for Patients:  EntrepreneurPulse.com.au  Fact Sheet for Healthcare Providers:  IncredibleEmployment.be  This test is no t yet approved or cleared by the [place] FDA and  has been authorized for detection and/or diagnosis of SARS-CoV-2 by FDA under an Emergency Use Authorization (EUA). This EUA will remain  in effect (meaning this test can be used) for the duration of the COVID-19 declaration under Section 564(b)(1) of the Act, 21 U.S.C.section 360bbb-3(b)(1), unless the authorization is terminated  or revoked sooner.       Influenza A by PCR NEGATIVE NEGATIVE Final   Influenza B by PCR NEGATIVE NEGATIVE Final    Comment: (NOTE) The Xpert Xpress SARS-CoV-2/FLU/RSV plus assay is intended as an aid in the diagnosis of influenza from Nasopharyngeal swab specimens and should not be used as a sole basis for treatment. Nasal washings and aspirates are unacceptable for Xpert Xpress  SARS-CoV-2/FLU/RSV testing.  Fact Sheet for Patients: EntrepreneurPulse.com.au  Fact Sheet for Healthcare Providers: IncredibleEmployment.be  This test is not yet approved or cleared by the [place] FDA and has been authorized for detection and/or diagnosis of SARS-CoV-2 by FDA under an Emergency Use Authorization (EUA). This EUA will remain in effect (meaning this test can be used) for the duration of the COVID-19 declaration under Section 564(b)(1) of the Act, 21 U.S.C. section 360bbb-3(b)(1), unless the authorization is terminated or revoked.  Performed at [facility], [place] [address] [place], [place] [address]   Culture, group A strep     Status: None   Collection Time: 07/01/2020 12:48 PM   Specimen: Throat  Result Value Ref Range Status   Specimen Description   Final    THROAT Performed at [place] [address], [place], [place] [address]    Special Requests   Final    NONE Performed at [facility], [place] [address] [place], [place] [address]    Culture   Final    NO GROUP A STREP (S.PYOGENES) ISOLATED Performed at [facility], [place] [address] [place], [place] [address]    Report Status 06/17/2020 FINAL  Final          Radiology Studies: No results found.      Scheduled Meds:  LORazepam  1 mg Intravenous Q4H   Continuous Infusions:   LOS: 4 days    Time spent: 42 min   Georgette Shell, MD 06/20/2020, 3:51 PM

## 2020-06-21 NOTE — Progress Notes (Addendum)
PROGRESS NOTE    Deanna [name]  EXH:[id number] DOB: [birth year] DOA: 07/07/2020 PCP: Lajean Manes, MD   Brief Narrative: 87 year old female from assisted living facility with history of hyperlipidemia hypertension and GERD admitted with altered mental status.  Patient was on hospice prior to admission.  In the ED she was noted to be clinically dehydrated with hypokalemia.  Assessment & Plan:   Active Problems:   AMS (altered mental status)  #1 acute encephalopathy secondary to multifactorial etiology with UTI, hypercalcemia, dehydration, hypokalemia. Had family meeting with the patient's daughter and son.  Her daughter is the power of attorney.  They do not want their mother to suffer and go in and out of the hospital without any benefits for her.  They would like their mother to be comfortable without any IVs or monitoring or telemetry boxes. I agree this was the best decision the family could have made for this patient.  After discussing with the family I have placed the patient on comfort care with no further lab works  Patient's CODE STATUS is DO NOT RESUSCITATE She is comfort care as of 06/16/2020. She appears to be more comfortable today than yesterday   Continue morphine and Ativan. Patient remains inpatient appropriate due to imminent death and need for ongoing IV comfort medications for delirium and end-of-life, patient is not able to take medicines p.o. at this time.  Estimated body mass index is 21.29 kg/m as calculated from the following:   Height as of 03/21/20: 5\' 2"  (1.575 m).   Weight as of this encounter: 52.8 kg.  DVT prophylaxis: None comfort care  code Status: dnr Family Communication: Discussed with patient's daughter and son at bedside  disposition Plan:  Status is: Inpatient  Remains inpatient appropriate because:Altered mental status and IV treatments appropriate due to intensity of illness or inability to take PO  Dispo: The patient is from:  [facility]               Anticipated d/c is to: Expecting hospital death              Patient currently is not medically stable to d/c.   Difficult to place patient No    Consultants:  NONE  Procedures: NONE Antimicrobials: NONE  Subjective: Daughter and son in the room stayed overnight. Patient resting periods of apnea noted  objective: Vitals:   06/18/20 2102 06/19/20 0430 06/20/20 0456 06/21/20 0436  BP: (!) 143/88 (!) 219/86 (!) 205/104 (!) 183/97  Pulse: 91 88 93 83  Resp: 19 20 16 18   Temp: 98 F (36.7 C) 97.8 F (36.6 C) 98.1 F (36.7 C) 97.9 F (36.6 C)  TempSrc: Axillary Axillary Oral Oral  SpO2: 95% 92% 99% 94%  Weight:  53.5 kg  52.8 kg   No intake or output data in the 24 hours ending 06/21/20 1258  Filed Weights   06/18/20 0534 06/19/20 0430 06/21/20 0436  Weight: 54 kg 53.5 kg 52.8 kg    Examination:  General exam: Appears in less distress than yesterday Respiratory system: Clear to auscultation. Respiratory effort normal. Cardiovascular system: S1 & S2 heard, RRR. No JVD, murmurs, rubs, gallops or clicks. No pedal edema. Gastrointestinal system: Abdomen is nondistended, soft and nontender. No organomegaly or masses felt. Normal bowel sounds heard. Central nervous system: Confused delirious  extremities: Moves all extremities. Skin: No rashes, lesions or ulcers Psychiatry: Impaired judgment   Data Reviewed: I have personally reviewed following labs and imaging studies  CBC: Recent Labs  Lab 06/30/2020 1202 06/16/20 0552  WBC 6.3 7.0  NEUTROABS 4.0  --   HGB 15.7* 15.0  HCT 48.9* 46.0  MCV 90.2 89.7  PLT 311 546    Basic Metabolic Panel: Recent Labs  Lab 07/01/2020 1202 06/11/2020 1732 06/16/20 0552  NA 134*  --  139  K 3.1*  --  3.5  CL 94*  --  102  CO2 30  --  26  GLUCOSE 115*  --  136*  BUN 28*  --  17  CREATININE 1.04*  --  0.73  CALCIUM 10.6* 11.0* 10.5*  MG 1.8  --   --     GFR: Estimated Creatinine Clearance: 36.2 mL/min (by C-G formula based on  SCr of 0.73 mg/dL). Liver Function Tests: Recent Labs  Lab 06/30/2020 1202 06/11/2020 1732 06/16/20 0552  AST 40  --  44*  ALT 28  --  29  ALKPHOS 59  --  53  BILITOT 2.1* 1.8* 2.0*  PROT 7.0  --  6.8  ALBUMIN 3.7  --  3.5    No results for input(s): LIPASE, AMYLASE in the last 168 hours. Recent Labs  Lab 06/24/2020 1202  AMMONIA 19    Coagulation Profile: Recent Labs  Lab 06/18/2020 1202  INR 1.1    Cardiac Enzymes: No results for input(s): CKTOTAL, CKMB, CKMBINDEX, TROPONINI in the last 168 hours. BNP (last 3 results) No results for input(s): PROBNP in the last 8760 hours. HbA1C: No results for input(s): HGBA1C in the last 72 hours. CBG: Recent Labs  Lab 06/27/2020 1205  GLUCAP 115*    Lipid Profile: No results for input(s): CHOL, HDL, LDLCALC, TRIG, CHOLHDL, LDLDIRECT in the last 72 hours. Thyroid Function Tests: No results for input(s): TSH, T4TOTAL, FREET4, T3FREE, THYROIDAB in the last 72 hours. Anemia Panel: No results for input(s): VITAMINB12, FOLATE, FERRITIN, TIBC, IRON, RETICCTPCT in the last 72 hours. Sepsis Labs: Recent Labs  Lab 06/09/2020 1202  LATICACIDVEN 0.9     Recent Results (from the past 240 hour(s))  Urine culture     Status: Abnormal   Collection Time: 06/11/2020 12:02 PM   Specimen: Urine, Random  Result Value Ref Range Status   Specimen Description   Final    URINE, RANDOM Performed at [facility] [address] [place], [place] [address]    Special Requests   Final    NONE Performed at [facility], [place] [address] [place], [place] [address]    Culture MULTIPLE SPECIES PRESENT, SUGGEST RECOLLECTION (A)  Final   Report Status 06/16/2020 FINAL  Final  Resp Panel by RT-PCR (Flu A&B, Covid) Nasopharyngeal Swab     Status: None   Collection Time: 06/30/2020 12:48 PM   Specimen: Nasopharyngeal Swab; Nasopharyngeal(NP) swabs in vial transport medium  Result Value Ref Range Status   SARS Coronavirus  2 by RT PCR NEGATIVE NEGATIVE Final    Comment: (NOTE) SARS-CoV-2 target nucleic acids are NOT DETECTED.  The SARS-CoV-2 RNA is generally detectable in upper respiratory specimens during the acute phase of infection. The lowest concentration of SARS-CoV-2 viral copies this assay can detect is 138 copies/mL. A negative result does not preclude SARS-Cov-2 infection and should not be used as the sole basis for treatment or other patient management decisions. A negative result may occur with  improper specimen collection/handling, submission of specimen other than nasopharyngeal swab, presence of viral mutation(s) within the areas targeted by this assay, and inadequate number of viral copies(<138 copies/mL). A  negative result must be combined with clinical observations, patient history, and epidemiological information. The expected result is Negative.  Fact Sheet for Patients:  EntrepreneurPulse.com.au  Fact Sheet for Healthcare Providers:  IncredibleEmployment.be  This test is no t yet approved or cleared by the [place] FDA and  has been authorized for detection and/or diagnosis of SARS-CoV-2 by FDA under an Emergency Use Authorization (EUA). This EUA will remain  in effect (meaning this test can be used) for the duration of the COVID-19 declaration under Section 564(b)(1) of the Act, 21 U.S.C.section 360bbb-3(b)(1), unless the authorization is terminated  or revoked sooner.       Influenza A by PCR NEGATIVE NEGATIVE Final   Influenza B by PCR NEGATIVE NEGATIVE Final    Comment: (NOTE) The Xpert Xpress SARS-CoV-2/FLU/RSV plus assay is intended as an aid in the diagnosis of influenza from Nasopharyngeal swab specimens and should not be used as a sole basis for treatment. Nasal washings and aspirates are unacceptable for Xpert Xpress SARS-CoV-2/FLU/RSV testing.  Fact Sheet for Patients: EntrepreneurPulse.com.au  Fact  Sheet for Healthcare Providers: IncredibleEmployment.be  This test is not yet approved or cleared by the [place] FDA and has been authorized for detection and/or diagnosis of SARS-CoV-2 by FDA under an Emergency Use Authorization (EUA). This EUA will remain in effect (meaning this test can be used) for the duration of the COVID-19 declaration under Section 564(b)(1) of the Act, 21 U.S.C. section 360bbb-3(b)(1), unless the authorization is terminated or revoked.  Performed at [facility], [place] [address] [place], [place] [address]   Culture, group A strep     Status: None   Collection Time: 06/08/2020 12:48 PM   Specimen: Throat  Result Value Ref Range Status   Specimen Description   Final    THROAT Performed at [place] [address], [place], [place] [address]    Special Requests   Final    NONE Performed at [facility], [place] [address] [place], [place] [address]    Culture   Final    NO GROUP A STREP (S.PYOGENES) ISOLATED Performed at [facility], [place] [address], [place],  [address]    Report Status 06/17/2020 FINAL  Final          Radiology Studies: No results found.      Scheduled Meds:  LORazepam  1 mg Intravenous Q4H   Continuous Infusions:   LOS: 5 days    Georgette Shell, MD 06/21/2020, 12:58 PM
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
## 2020-06-22 MED ORDER — HALOPERIDOL LACTATE 5 MG/ML IJ SOLN: 0.5000 mg | INTRAMUSCULAR | Status: DC | PRN

## 2020-06-22 MED ORDER — MORPHINE 100MG IN NS 100ML (1MG/ML) PREMIX INFUSION
1.0000 mg/h | INTRAVENOUS | Status: DC
  Administered 2020-06-22: 1 mg/h via INTRAVENOUS
  Filled 2020-06-22: qty 100

## 2020-06-22 MED ORDER — ONDANSETRON HCL 4 MG/2ML IJ SOLN: 4.0000 mg | Freq: Four times a day (QID) | INTRAMUSCULAR | Status: DC | PRN

## 2020-06-22 MED ORDER — MORPHINE BOLUS VIA INFUSION
1.0000 mg | INTRAVENOUS | Status: DC | PRN
  Filled 2020-06-22: qty 1

## 2020-06-22 MED ORDER — POLYVINYL ALCOHOL 1.4 % OP SOLN: 1.0000 [drp] | Freq: Four times a day (QID) | OPHTHALMIC | Status: DC | PRN

## 2020-06-22 MED ORDER — GLYCOPYRROLATE 0.2 MG/ML IJ SOLN: 0.2000 mg | INTRAMUSCULAR | Status: DC | PRN

## 2020-06-22 MED ORDER — ONDANSETRON 4 MG PO TBDP: 4.0000 mg | ORAL_TABLET | Freq: Four times a day (QID) | ORAL | Status: DC | PRN

## 2020-06-22 MED ORDER — BIOTENE DRY MOUTH MT LIQD: 15.0000 mL | OROMUCOSAL | Status: DC | PRN

## 2020-06-22 MED ORDER — HALOPERIDOL LACTATE 2 MG/ML PO CONC
0.5000 mg | ORAL | Status: DC | PRN
  Filled 2020-06-22: qty 0.3

## 2020-06-22 MED ORDER — HALOPERIDOL 0.5 MG PO TABS: 0.5000 mg | ORAL_TABLET | ORAL | Status: DC | PRN

## 2020-06-22 MED ORDER — GLYCOPYRROLATE 1 MG PO TABS: 1.0000 mg | ORAL_TABLET | ORAL | Status: DC | PRN

## 2020-07-08 NOTE — TOC Transition Note (Signed)
Transition of Care [facility]) - CM/SW Discharge Note   Patient Details  Name: Deanna [name] MRN: [id number] Date of Birth: October 15, 1926  Transition of Care [facility]) CM/SW Contact:  Leeroy Cha, RN Phone Number: 07/17/20, 3:47 PM   Clinical Narrative:    Pt okayed to go to [facility].Dc summary is in the system.  Medical necessity form completed and printed out to floor.  Floor nurse made aware to get form for ems.  [facility] called for transport at 1540. To go to [facility]. Report to be called to regular number. Do not have room number.  Final next level of care: [place] Barriers to Discharge: Barriers Resolved   Patient Goals and CMS Choice        Discharge Placement                       Discharge Plan and Services   Discharge Planning Services: CM Consult                        [facility] Agency: [facility]        Social Determinants of Health (SDOH) Interventions     Readmission Risk Interventions No flowsheet data found.

## 2020-07-08 NOTE — Death Summary Note (Signed)
DEATH SUMMARY   Patient Details  Name: Deanna [name] MRN: [id number] DOB: January 15, 1926  Admission/Discharge Information   Admit Date:  06-23-2020  Date of Death: Date of Death: 06/30/20  Time of Death: Time of Death: 1855  Length of Stay: 6  Referring Physician: Lajean Manes, MD   Reason(s) for Hospitalization  Acute metabolic encephalopathy  Diagnoses  Preliminary cause of death:  Secondary Diagnoses (including complications and co-morbidities):  Active Problems:   AMS (altered mental status)   Brief Hospital Course (including significant findings, care, treatment, and services provided and events leading to death)  Deanna [name] is Brissa Asante 87 y.o. year old female with Kassandra Meriweather history of HTN, HLD, GERD who was enrolled in hospice services prior to admission who presented with altered mental status which was thought to be multifactorial.  She was transitioned to comfort care and planned to try to get her to [facility] on 2022-07-01, but she declined throughout the day.  She passed away inpatient on 07/01/22.  See previous notes for additional details.   Pertinent Labs and Studies  Significant Diagnostic Studies CT HEAD WO CONTRAST  Result Date: 2020/06/23 CLINICAL DATA:  Delirium. EXAM: CT HEAD WITHOUT CONTRAST TECHNIQUE: Contiguous axial images were obtained from the base of the skull through the vertex without intravenous contrast. COMPARISON:  June 09, 2020 FINDINGS: Brain: No evidence of acute infarction, hemorrhage, hydrocephalus, extra-axial collection or mass lesion/mass effect. Atrophy and chronic small vessel ischemic changes in the white matter. Vascular: Calcific atherosclerotic disease of the intra cavernous carotid arteries. Skull: Normal. Negative for fracture or focal lesion. Sinuses/Orbits: No acute finding. Other: None. IMPRESSION: 1. No acute intracranial abnormality. 2. Atrophy and chronic microvascular ischemic changes in the white matter. Electronically Signed   By: Fidela Salisbury  M.D.   On: 06-23-2020 13:10   CT HEAD WO CONTRAST  Result Date: 06/09/2020 CLINICAL DATA:  Mental status change EXAM: CT HEAD WITHOUT CONTRAST TECHNIQUE: Contiguous axial images were obtained from the base of the skull through the vertex without intravenous contrast. COMPARISON:  CT brain 09/10/2013 FINDINGS: Brain: No acute territorial infarction, hemorrhage or intracranial mass. Moderate atrophy. Moderate hypodensity in the white matter consistent with chronic small vessel ischemic change. Stable ventricle size Vascular: No hyperdense vessels.  Carotid vascular calcification Skull: Normal. Negative for fracture or focal lesion. Sinuses/Orbits: No acute finding. Other: None IMPRESSION: 1. No CT evidence for acute intracranial abnormality. 2. Atrophy and chronic small vessel ischemic change of the white matter Electronically Signed   By: Donavan Foil M.D.   On: 06/09/2020 02:36   DG Chest Port 1 View  Result Date: Jun 23, 2020 CLINICAL DATA:  Confusion. EXAM: PORTABLE CHEST 1 VIEW COMPARISON:  March 21, 2020 FINDINGS: Enlarged cardiac silhouette. Calcific atherosclerotic disease and tortuosity of the aorta. Large hiatal hernia. There is no evidence of focal airspace consolidation, pleural effusion or pneumothorax. Osseous structures are without acute abnormality. Stable postsurgical changes in the right thorax. Breast implants. IMPRESSION: 1. Enlarged cardiac silhouette. 2. Large hiatal hernia. 3. Calcific atherosclerotic disease and tortuosity of the aorta. Electronically Signed   By: Fidela [place] M.D.   On: 23-Jun-2020 13:27   DG Shoulder Left  Result Date: 06/09/2020 CLINICAL DATA:  Fall.  Pain. EXAM: LEFT SHOULDER - 2+ VIEW COMPARISON:  CT 03/21/2020. FINDINGS: Acromioclavicular and glenohumeral degenerative change. High-riding left shoulder suggesting rotator cuff tear. No acute bony or joint abnormality. No evidence of fracture, dislocation, or separation. Aortic and carotid vascular  calcification. Surgical clip noted over  the left chest. Left breast implant noted. IMPRESSION: 1. Acromioclavicular and glenohumeral degenerative change. High-riding left shoulder suggesting rotator cuff tear. No acute bony abnormality. 2.  Aortic and carotid atherosclerotic vascular disease. Electronically Signed   By: Marcello Moores  Register   On: 06/09/2020 06:59    Microbiology Recent Results (from the past 240 hour(s))  Urine culture     Status: Abnormal   Collection Time: 06/16/2020 12:02 PM   Specimen: Urine, Random  Result Value Ref Range Status   Specimen Description   Final    URINE, RANDOM Performed at [facility] [address], [place], [place] [address]    Special Requests   Final    NONE Performed at [facility], [place] [address] [place], [place] [address]    Culture MULTIPLE SPECIES PRESENT, SUGGEST RECOLLECTION (Deante Blough)  Final   Report Status 06/16/2020 FINAL  Final  Resp Panel by RT-PCR (Flu Dayannara Pascal&B, Covid) Nasopharyngeal Swab     Status: None   Collection Time: 07/03/2020 12:48 PM   Specimen: Nasopharyngeal Swab; Nasopharyngeal(NP) swabs in vial transport medium  Result Value Ref Range Status   SARS Coronavirus 2 by RT PCR NEGATIVE NEGATIVE Final    Comment: (NOTE) SARS-CoV-2 target nucleic acids are NOT DETECTED.  The SARS-CoV-2 RNA is generally detectable in upper respiratory specimens during the acute phase of infection. The lowest concentration of SARS-CoV-2 viral copies this assay can detect is 138 copies/mL. Srinivas Lippman negative result does not preclude SARS-Cov-2 infection and should not be used as the sole basis for treatment or other patient management decisions. Areon Cocuzza negative result may occur with  improper specimen collection/handling, submission of specimen other than nasopharyngeal swab, presence of viral mutation(s) within the areas targeted by this assay, and inadequate number of viral copies(<138 copies/mL). Zakyra Kukuk negative result must be  combined with clinical observations, patient history, and epidemiological information. The expected result is Negative.  Fact Sheet for Patients:  EntrepreneurPulse.com.au  Fact Sheet for Healthcare Providers:  IncredibleEmployment.be  This test is no t yet approved or cleared by the [place] FDA and  has been authorized for detection and/or diagnosis of SARS-CoV-2 by FDA under an Emergency Use Authorization (EUA). This EUA will remain  in effect (meaning this test can be used) for the duration of the COVID-19 declaration under Section 564(b)(1) of the Act, 21 U.S.C.section 360bbb-3(b)(1), unless the authorization is terminated  or revoked sooner.       Influenza Vanissa Strength by PCR NEGATIVE NEGATIVE Final   Influenza B by PCR NEGATIVE NEGATIVE Final    Comment: (NOTE) The Xpert Xpress SARS-CoV-2/FLU/RSV plus assay is intended as an aid in the diagnosis of influenza from Nasopharyngeal swab specimens and should not be used as Tayen Narang sole basis for treatment. Nasal washings and aspirates are unacceptable for Xpert Xpress SARS-CoV-2/FLU/RSV testing.  Fact Sheet for Patients: EntrepreneurPulse.com.au  Fact Sheet for Healthcare Providers: IncredibleEmployment.be  This test is not yet approved or cleared by the [place] FDA and has been authorized for detection and/or diagnosis of SARS-CoV-2 by FDA under an Emergency Use Authorization (EUA). This EUA will remain in effect (meaning this test can be used) for the duration of the COVID-19 declaration under Section 564(b)(1) of the Act, 21 U.S.C. section 360bbb-3(b)(1), unless the authorization is terminated or revoked.  Performed at [facility], [place] [address] [address] [place], [place] [address]   Culture, group Neesha Langton strep     Status: None   Collection Time: 07/02/2020 12:48 PM   Specimen:  Throat  Result Value Ref Range Status   Specimen Description    Final    THROAT Performed at [facility] [address], [place], [place] [address]    Special Requests   Final    NONE Performed at [facility], [place] [address] [place], [place] [address]    Culture   Final    NO GROUP Nava Song STREP (S.PYOGENES) ISOLATED Performed at [facility] [place] [facility], [place] [address], [place], [place] [address]    Report Status 06/17/2020 FINAL  Final    Lab Basic Metabolic Panel: Recent Labs  Lab 06/16/20 0552  NA 139  K 3.5  CL 102  CO2 26  GLUCOSE 136*  BUN 17  CREATININE 0.73  CALCIUM 10.5*   Liver Function Tests: Recent Labs  Lab 06/16/20 0552  AST 44*  ALT 29  ALKPHOS 53  BILITOT 2.0*  PROT 6.8  ALBUMIN 3.5   No results for input(s): LIPASE, AMYLASE in the last 168 hours. No results for input(s): AMMONIA in the last 168 hours. CBC: Recent Labs  Lab 06/16/20 0552  WBC 7.0  HGB 15.0  HCT 46.0  MCV 89.7  PLT 295   Cardiac Enzymes: No results for input(s): CKTOTAL, CKMB, CKMBINDEX, TROPONINI in the last 168 hours. Sepsis Labs: Recent Labs  Lab 06/16/20 0552  WBC 7.0    Procedures/Operations  See previous notes   Fayrene Helper 06/25/2020, 8:45 PM

## 2020-07-08 NOTE — Progress Notes (Signed)
Pt expired at [place]. Family at bedside at time of death. No heart tones or breath sounds auscultated. Pt was DNR and hospital death was expected. No s/s of pain or discomfort at time of death.

## 2020-07-08 NOTE — Progress Notes (Signed)
Called report to Jonelle Sidle ,Therapist, sports at [facility].

## 2020-07-08 NOTE — Progress Notes (Signed)
[facility])   Current GIP patient Ms. Deanna [name] is now a referral for [facility] and per Dr. Tomasa Hosteller with [facility] she is approved to come.    Please leave IV in place.    Once completed, please fax dc summary to [phone].   RN staff, you may call report at any time to [phone], room is assigned when report is called.   Please send completed DNR with patient.   Updated attending and [facility] manager via [place].

## 2020-07-08 NOTE — Progress Notes (Signed)
Had planned to potentially try to get patient to [facility]. She's not appropriate for discharge at this time as she's declined since this morning.   She has increased work of breathing, irregular breathing.  Anticipate hospital death.   Will plan for morphine gtt for comfort.  Discussed with daughter plan of care and expectations and concern for imminent passing.

## 2020-07-08 NOTE — Progress Notes (Signed)
[facility] called and notified of pt's death, pt is not suitable for donation due to age, body can be released to funeral home per [facility], referral number [phone]. Pt's family wants pt's remains to be released to [place] at [address], pt. [facility] notified and pt transported to the morgue.

## 2020-07-08 NOTE — Discharge Summary (Signed)
Physician Discharge Summary  Deanna [name]:[id number] DOB: 10-30-1926 DOA: 06/24/2020  PCP: Lajean Manes, MD  Admit date: 06/29/2020 Discharge date: Jul 22, 2020  Time spent: 40 minutes  Recommendations for Outpatient Follow-up:  Comfort per [facility]    Discharge Diagnoses:  Active Problems:   AMS (altered mental status)   Discharge Condition: guarded  Diet recommendation: as tolerated  Filed Weights   06/19/20 0430 06/21/20 0436 07-22-2020 6283  Weight: 53.5 kg 52.8 kg 51.7 kg    History of present illness:  87 year old female from assisted living facility with history of hyperlipidemia hypertension and GERD admitted with altered mental status.  Patient was on hospice prior to admission.  In the ED she was noted to be clinically dehydrated with hypokalemia.  She had confusion that was multifactoria, thought related to UTI, dehydration, hypokalemia, etc.  After [name] family meeting plan was for comfort meassures.  See below for additional details  Hospital Course:  #1 acute encephalopathy secondary to multifactorial etiology with UTI, hypercalcemia, dehydration, hypokalemia. Previous provider had family meeting with the patient's daughter and son.  Her daughter is the power of attorney.  "They do not want their mother to suffer and go in and out of the hospital without any benefits for her.  They would like their mother to be comfortable without any IVs or monitoring or telemetry boxes. I agree this was the best decision the family could have made for this patient.  After discussing with the family I have placed the patient on comfort care with no further lab works." Patient continues on comfort care today, the day of discharge - she's hemodynamically stable, appears somewhat uncomfortable - will continue treating with ativan/morphine prn.   Will try to get [facility] to [facility] today.  Procedures: none  Consultations: none  Discharge Exam: Vitals:   06/21/20 0436 22-Jul-2020  0633  BP: (!) 183/97 (!) 161/92  Pulse: 83 90  Resp: 18 18  Temp: 97.9 F (36.6 C) 97.8 F (36.6 C)  SpO2: 94% 95%   Does not speak or meaningfully respond to voice Daughter and [name] from hospice at bedside - discussed trying to send her to [facility] today  General: appears somewhat uncomfortable Lungs: creased wob, appears somewhat uncomfortable - breathing somewhat irregular, mildly tachypneic Neurological: eyes open, but does not respond to voice meaningfully  Skin: Warm and dry. No rashes or lesions. Extremities: No clubbing or cyanosis. No edema  Discharge Instructions   Discharge Instructions     Discharge instructions   Complete by: As directed    You're being discharged to inpatient hospice for comfort measures.   Increase activity slowly   Complete by: As directed       Allergies as of 22-Jul-2020       Reactions   Aspirin Other (See Comments)   REACTION: nervousness---tolerates ibuprofen   Atorvastatin Other (See Comments)   myalgia        Medication List     STOP taking these medications    acetaminophen 650 MG CR tablet Commonly known as: TYLENOL   allopurinol 100 MG tablet Commonly known as: ZYLOPRIM   ALPRAZolam 0.25 MG tablet Commonly known as: XANAX   amLODipine 5 MG tablet Commonly known as: NORVASC   carboxymethylcellul-glycerin 0.5-0.9 % ophthalmic solution Commonly known as: REFRESH OPTIVE   cetirizine 5 MG tablet Commonly known as: ZYRTEC   DULoxetine 30 MG capsule Commonly known as: CYMBALTA   fenofibrate 160 MG tablet   furosemide 20 MG tablet Commonly  known as: LASIX   HAIR/SKIN/NAILS PO   HYDROcodone-acetaminophen 5-325 MG tablet Commonly known as: NORCO/VICODIN   lidocaine 5 % Commonly known as: LIDODERM   multivitamin with minerals Tabs tablet   pantoprazole 40 MG tablet Commonly known as: PROTONIX   pravastatin 40 MG tablet Commonly known as: PRAVACHOL   sodium chloride 0.65 % Soln nasal  spray Commonly known as: OCEAN   sucralfate 1 g tablet Commonly known as: CARAFATE   Systane Balance 0.6 % Soln Generic drug: Propylene Glycol   Vitamin D 50 MCG (2000 UT) tablet       Allergies  Allergen Reactions   Aspirin Other (See Comments)    REACTION: nervousness---tolerates ibuprofen   Atorvastatin Other (See Comments)    myalgia      The results of significant diagnostics from this hospitalization (including imaging, microbiology, ancillary and laboratory) are listed below for reference.    Significant Diagnostic Studies: CT HEAD WO CONTRAST  Result Date: 07/05/2020 CLINICAL DATA:  Delirium. EXAM: CT HEAD WITHOUT CONTRAST TECHNIQUE: Contiguous axial images were obtained from the base of the skull through the vertex without intravenous contrast. COMPARISON:  June 09, 2020 FINDINGS: Brain: No evidence of acute infarction, hemorrhage, hydrocephalus, extra-axial collection or mass lesion/mass effect. Atrophy and chronic small vessel ischemic changes in the white matter. Vascular: Calcific atherosclerotic disease of the intra cavernous carotid arteries. Skull: Normal. Negative for fracture or focal lesion. Sinuses/Orbits: No acute finding. Other: None. IMPRESSION: 1. No acute intracranial abnormality. 2. Atrophy and chronic microvascular ischemic changes in the white matter. Electronically Signed   By: Fidela [place] M.D.   On: 06/18/2020 13:10   CT HEAD WO CONTRAST  Result Date: 06/09/2020 CLINICAL DATA:  Mental status change EXAM: CT HEAD WITHOUT CONTRAST TECHNIQUE: Contiguous axial images were obtained from the base of the skull through the vertex without intravenous contrast. COMPARISON:  CT brain 09/10/2013 FINDINGS: Brain: No acute territorial infarction, hemorrhage or intracranial mass. Moderate atrophy. Moderate hypodensity in the white matter consistent with chronic small vessel ischemic change. Stable ventricle size Vascular: No hyperdense vessels.  Carotid vascular  calcification Skull: Normal. Negative for fracture or focal lesion. Sinuses/Orbits: No acute finding. Other: None IMPRESSION: 1. No CT evidence for acute intracranial abnormality. 2. Atrophy and chronic small vessel ischemic change of the white matter Electronically Signed   By: Donavan Foil M.D.   On: 06/09/2020 02:36   DG Chest Port 1 View  Result Date: 06/17/2020 CLINICAL DATA:  Confusion. EXAM: PORTABLE CHEST 1 VIEW COMPARISON:  March 21, 2020 FINDINGS: Enlarged cardiac silhouette. Calcific atherosclerotic disease and tortuosity of the aorta. Large hiatal hernia. There is no evidence of focal airspace consolidation, pleural effusion or pneumothorax. Osseous structures are without acute abnormality. Stable postsurgical changes in the right thorax. Breast implants. IMPRESSION: 1. Enlarged cardiac silhouette. 2. Large hiatal hernia. 3. Calcific atherosclerotic disease and tortuosity of the aorta. Electronically Signed   By: Fidela [place] M.D.   On: 06/17/2020 13:27   DG Shoulder Left  Result Date: 06/09/2020 CLINICAL DATA:  Fall.  Pain. EXAM: LEFT SHOULDER - 2+ VIEW COMPARISON:  CT 03/21/2020. FINDINGS: Acromioclavicular and glenohumeral degenerative change. High-riding left shoulder suggesting rotator cuff tear. No acute bony or joint abnormality. No evidence of fracture, dislocation, or separation. Aortic and carotid vascular calcification. Surgical clip noted over the left chest. Left breast implant noted. IMPRESSION: 1. Acromioclavicular and glenohumeral degenerative change. High-riding left shoulder suggesting rotator cuff tear. No acute bony abnormality. 2.  Aortic and carotid atherosclerotic  vascular disease. Electronically Signed   By: Marcello Moores  Register   On: 06/09/2020 06:59    Microbiology: Recent Results (from the past 240 hour(s))  Urine culture     Status: Abnormal   Collection Time: 07/06/2020 12:02 PM   Specimen: Urine, Random  Result Value Ref Range Status   Specimen Description    Final    URINE, RANDOM Performed at [facility] [address], [place], [place] [address]    Special Requests   Final    NONE Performed at [facility], [place] [address] [place], [place] [address]    Culture MULTIPLE SPECIES PRESENT, SUGGEST RECOLLECTION (Neno Hohensee)  Final   Report Status 06/16/2020 FINAL  Final  Resp Panel by RT-PCR (Flu Zelma Snead&B, Covid) Nasopharyngeal Swab     Status: None   Collection Time: 06/20/2020 12:48 PM   Specimen: Nasopharyngeal Swab; Nasopharyngeal(NP) swabs in vial transport medium  Result Value Ref Range Status   SARS Coronavirus 2 by RT PCR NEGATIVE NEGATIVE Final    Comment: (NOTE) SARS-CoV-2 target nucleic acids are NOT DETECTED.  The SARS-CoV-2 RNA is generally detectable in upper respiratory specimens during the acute phase of infection. The lowest concentration of SARS-CoV-2 viral copies this assay can detect is 138 copies/mL. Bellina Tokarczyk negative result does not preclude SARS-Cov-2 infection and should not be used as the sole basis for treatment or other patient management decisions. Zayden Maffei negative result may occur with  improper specimen collection/handling, submission of specimen other than nasopharyngeal swab, presence of viral mutation(s) within the areas targeted by this assay, and inadequate number of viral copies(<138 copies/mL). Frankie Scipio negative result must be combined with clinical observations, patient history, and epidemiological information. The expected result is Negative.  Fact Sheet for Patients:  EntrepreneurPulse.com.au  Fact Sheet for Healthcare Providers:  IncredibleEmployment.be  This test is no t yet approved or cleared by the [place] FDA and  has been authorized for detection and/or diagnosis of SARS-CoV-2 by FDA under an Emergency Use Authorization (EUA). This EUA will remain  in effect (meaning this test can be used) for the duration of the COVID-19 declaration under  Section 564(b)(1) of the Act, 21 U.S.C.section 360bbb-3(b)(1), unless the authorization is terminated  or revoked sooner.       Influenza Sparrow Sanzo by PCR NEGATIVE NEGATIVE Final   Influenza B by PCR NEGATIVE NEGATIVE Final    Comment: (NOTE) The Xpert Xpress SARS-CoV-2/FLU/RSV plus assay is intended as an aid in the diagnosis of influenza from Nasopharyngeal swab specimens and should not be used as Derelle Cockrell sole basis for treatment. Nasal washings and aspirates are unacceptable for Xpert Xpress SARS-CoV-2/FLU/RSV testing.  Fact Sheet for Patients: EntrepreneurPulse.com.au  Fact Sheet for Healthcare Providers: IncredibleEmployment.be  This test is not yet approved or cleared by the [place] FDA and has been authorized for detection and/or diagnosis of SARS-CoV-2 by FDA under an Emergency Use Authorization (EUA). This EUA will remain in effect (meaning this test can be used) for the duration of the COVID-19 declaration under Section 564(b)(1) of the Act, 21 U.S.C. section 360bbb-3(b)(1), unless the authorization is terminated or revoked.  Performed at [facility], [place] [address], [place], [place] [address]   Culture, group Maanasa Aderhold strep     Status: None   Collection Time: 06/24/2020 12:48 PM   Specimen: Throat  Result Value Ref Range Status   Specimen Description   Final    THROAT Performed at [facility] [address] [place], [place]  [address]    Special Requests   Final    NONE Performed at [facility], [place] [address] [place], [place] [address]    Culture   Final    NO GROUP Burnette Sautter STREP (S.PYOGENES) ISOLATED Performed at [facility], [place] [address], [place], [place] [address]    Report Status 06/17/2020 FINAL  Final     Labs: Basic Metabolic Panel: Recent Labs  Lab 07/06/2020 1732 06/16/20 0552  NA  --  139  K  --  3.5  CL  --  102  CO2  --  26  GLUCOSE  --  136*  BUN  --   17  CREATININE  --  0.73  CALCIUM 11.0* 10.5*   Liver Function Tests: Recent Labs  Lab 06/28/2020 1732 06/16/20 0552  AST  --  44*  ALT  --  29  ALKPHOS  --  53  BILITOT 1.8* 2.0*  PROT  --  6.8  ALBUMIN  --  3.5   No results for input(s): LIPASE, AMYLASE in the last 168 hours. No results for input(s): AMMONIA in the last 168 hours. CBC: Recent Labs  Lab 06/16/20 0552  WBC 7.0  HGB 15.0  HCT 46.0  MCV 89.7  PLT 295   Cardiac Enzymes: No results for input(s): CKTOTAL, CKMB, CKMBINDEX, TROPONINI in the last 168 hours. BNP: BNP (last 3 results) Recent Labs    03/21/20 1254 03/23/20 0404 06/14/2020 1202  BNP 150.1* 192.7* 69.1    ProBNP (last 3 results) No results for input(s): PROBNP in the last 8760 hours.  CBG: No results for input(s): GLUCAP in the last 168 hours.     Signed:  Fayrene Helper MD.  [facility] 06/24/20, 3:21 PM

## 2020-07-08 DEATH — deceased

## 2022-07-31 IMAGING — CT CT HEAD W/O CM
3 series · 16 of 47 positions shown, 19 images · non-contrast
Comparison: June 09, 2020

CLINICAL DATA: Delirium.

EXAM:
CT HEAD WITHOUT CONTRAST
TECHNIQUE: Contiguous axial images were obtained from the base of the skull
through the vertex without intravenous contrast.

[Series 3: head wo · axial · 0.42mm/px · z∈[-103,+27]mm · 10 of 32 slices shown, 13 images]
[im 3/32  brain]
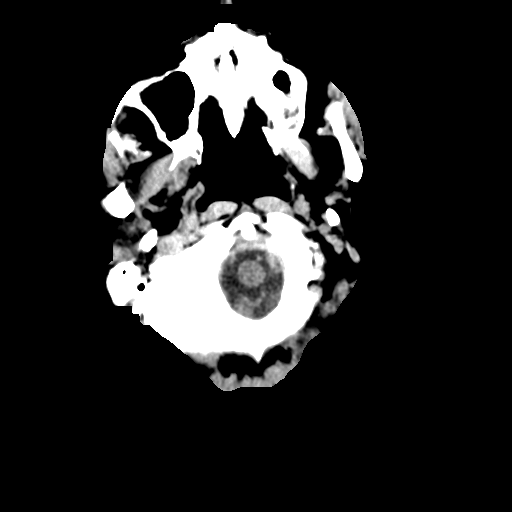
[im 3/32  bone]
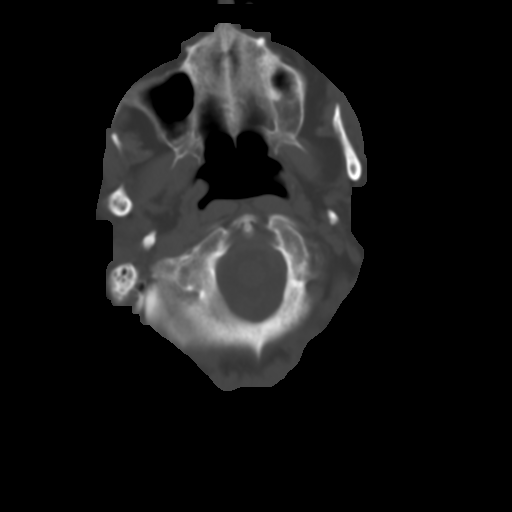
[im 6/32  brain]
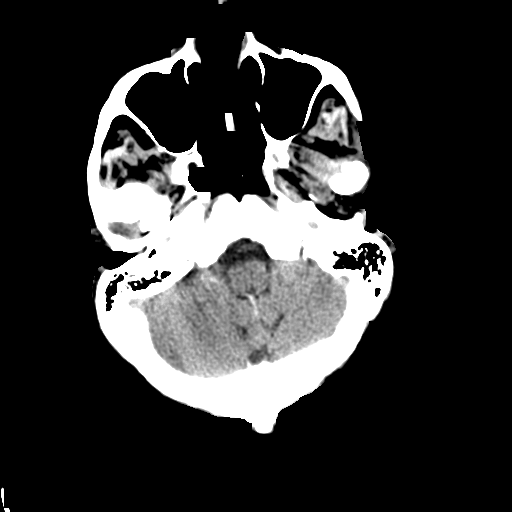
[im 9/32  brain]
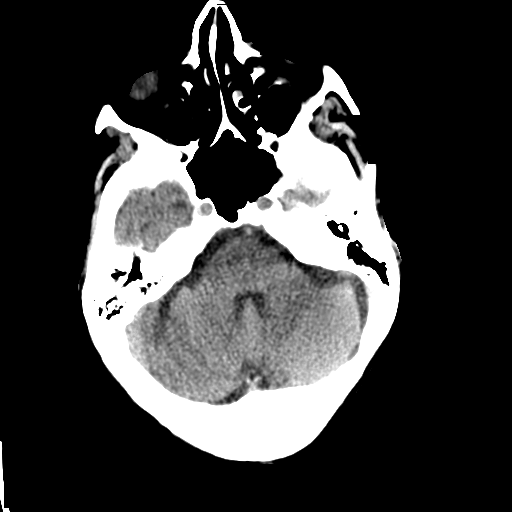
[im 11/32  brain]
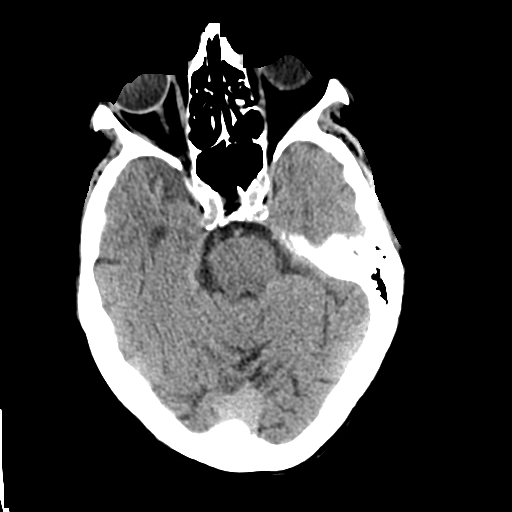
[im 14/32  brain]
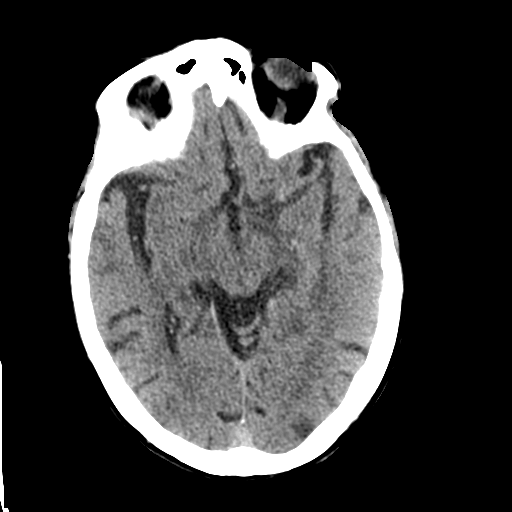
[im 14/32  bone]
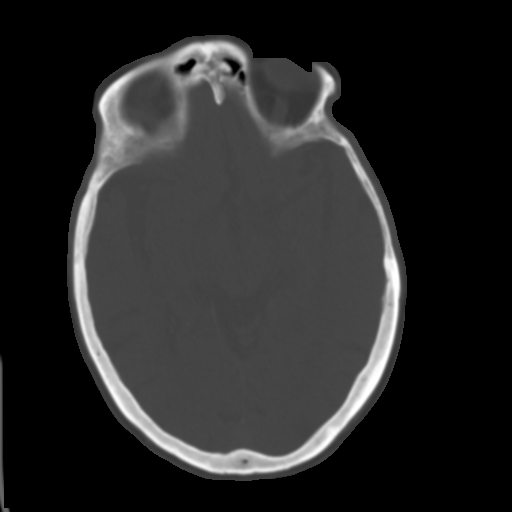
[im 18/32  brain]
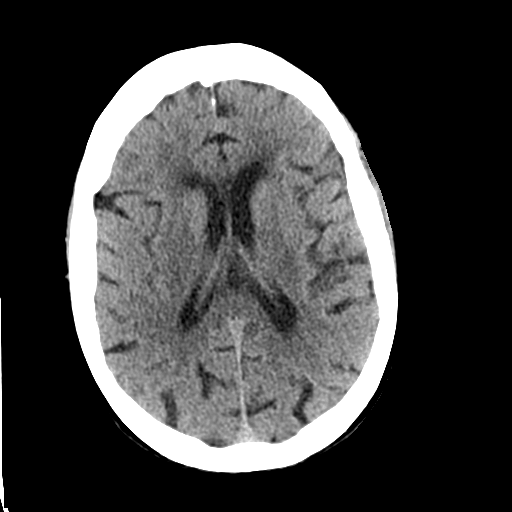
[im 21/32  brain]
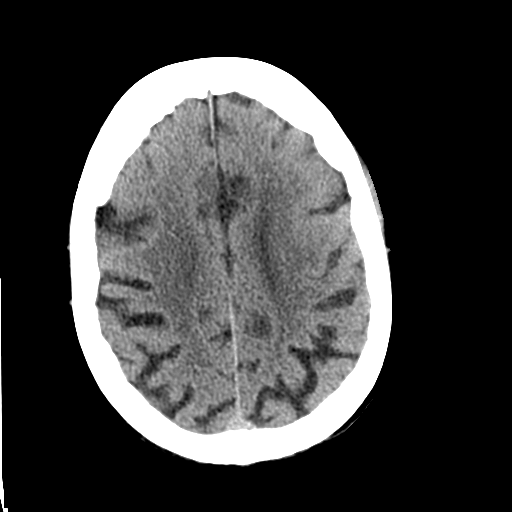
[im 24/32  brain]
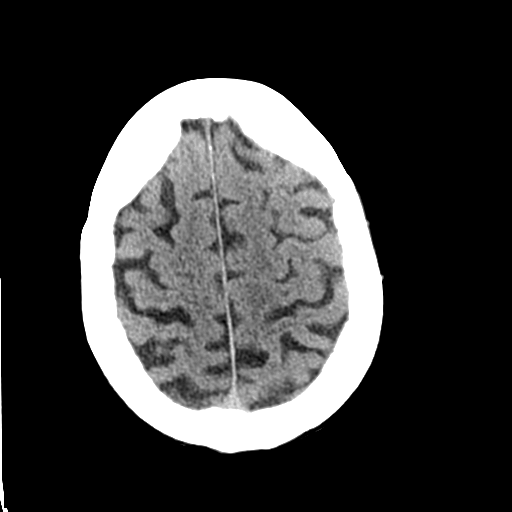
[im 26/32  brain]
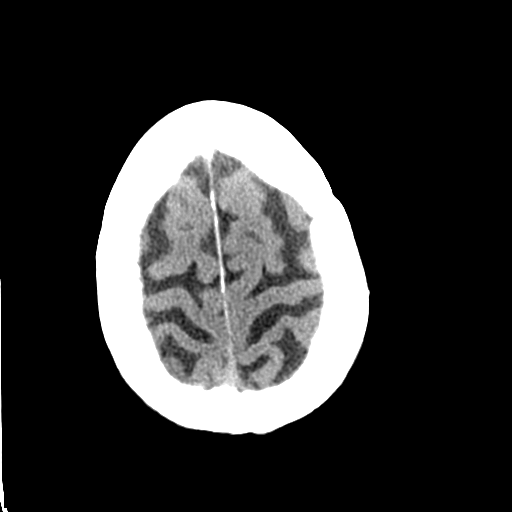
[im 26/32  bone]
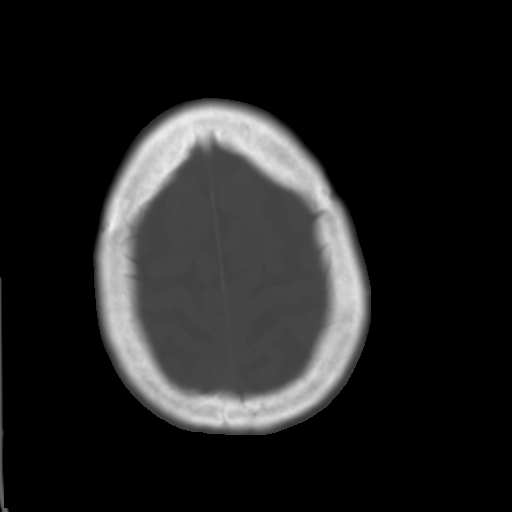
[im 29/32  brain]
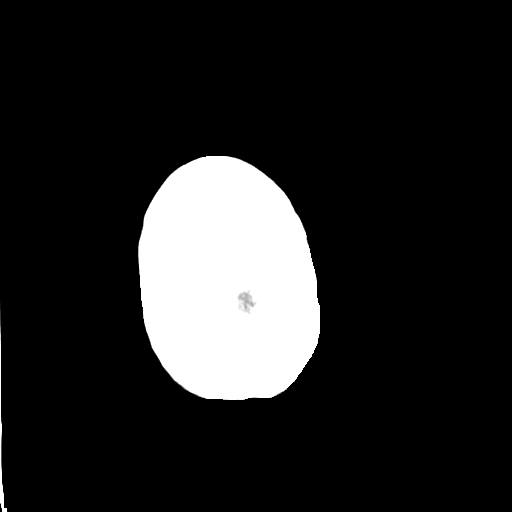

[Series 5: coronal soft tissue · coronal · 0.30mm/px · 3 of 66 slices shown]
[im 24/66  brain]
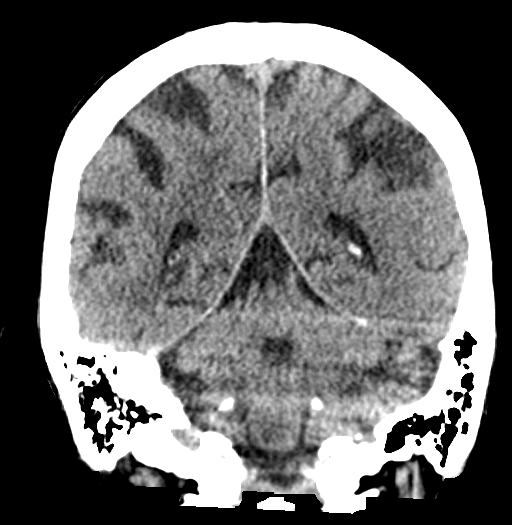
[im 30/66  brain]
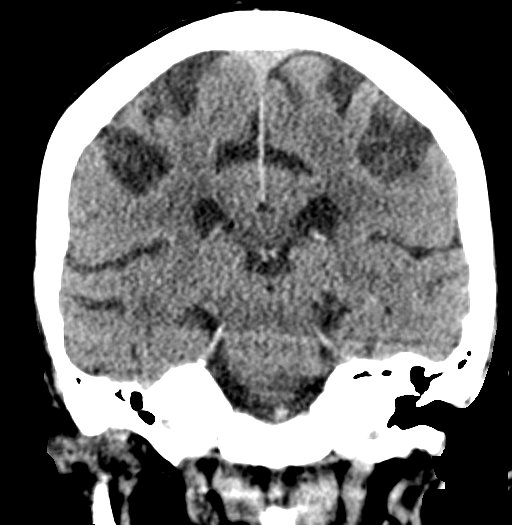
[im 36/66  brain]
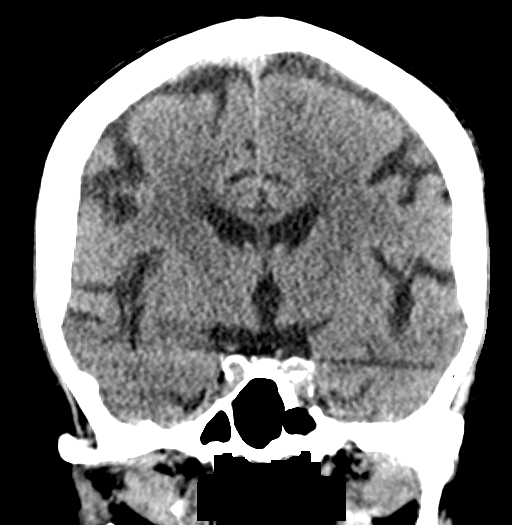

[Series 6: sagittal soft tissue · sagittal · 0.30mm/px · 3 of 51 slices shown]
[im 17/51  brain]
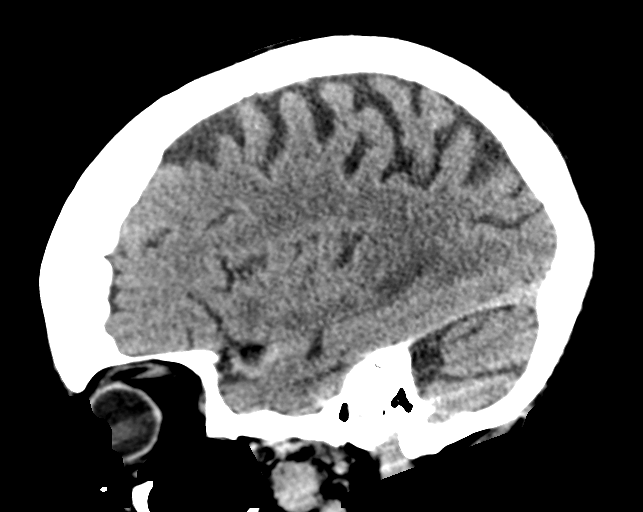
[im 26/51  brain]
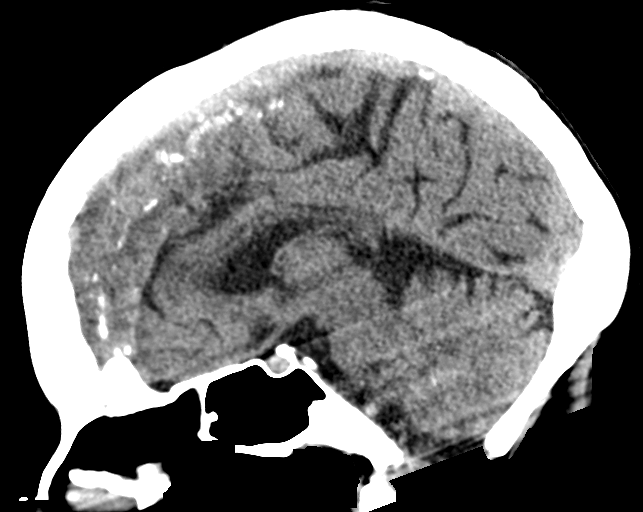
[im 34/51  brain]
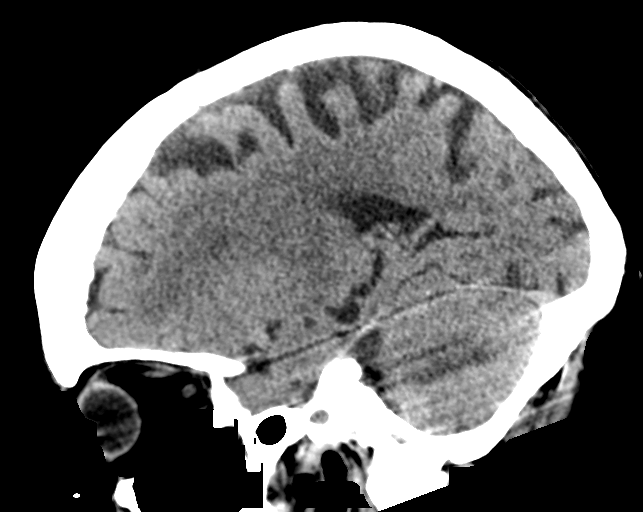

[16 of 47 positions shown; findings below may reference images not displayed]

FINDINGS: Brain: No evidence of acute infarction, hemorrhage, hydrocephalus,
extra-axial collection or mass lesion/mass effect. Atrophy and
chronic small vessel ischemic changes in the white matter.

Vascular: Calcific atherosclerotic disease of the intra cavernous
carotid arteries.

Skull: Normal. Negative for fracture or focal lesion.

Sinuses/Orbits: No acute finding.

Other: None.
IMPRESSION: 1. No acute intracranial abnormality.
2. Atrophy and chronic microvascular ischemic changes in the white
matter.
